# Patient Record
Sex: Female | Born: 1960 | Race: White | Hispanic: No | Marital: Married | Smoking: Never smoker
Health system: Southern US, Community
[De-identification: ages and names within clinical notes are randomized; demographics above are authoritative.]

## PROBLEM LIST (undated history)

## (undated) DIAGNOSIS — D649 Anemia, unspecified: Secondary | ICD-10-CM

## (undated) DIAGNOSIS — R42 Dizziness and giddiness: Secondary | ICD-10-CM

## (undated) DIAGNOSIS — M8580 Other specified disorders of bone density and structure, unspecified site: Secondary | ICD-10-CM

## (undated) DIAGNOSIS — K219 Gastro-esophageal reflux disease without esophagitis: Secondary | ICD-10-CM

## (undated) DIAGNOSIS — T8859XA Other complications of anesthesia, initial encounter: Secondary | ICD-10-CM

## (undated) DIAGNOSIS — T7840XA Allergy, unspecified, initial encounter: Secondary | ICD-10-CM

## (undated) DIAGNOSIS — I10 Essential (primary) hypertension: Secondary | ICD-10-CM

## (undated) DIAGNOSIS — T4145XA Adverse effect of unspecified anesthetic, initial encounter: Secondary | ICD-10-CM

## (undated) DIAGNOSIS — E7800 Pure hypercholesterolemia, unspecified: Secondary | ICD-10-CM

## (undated) DIAGNOSIS — G43909 Migraine, unspecified, not intractable, without status migrainosus: Secondary | ICD-10-CM

## (undated) DIAGNOSIS — M1990 Unspecified osteoarthritis, unspecified site: Secondary | ICD-10-CM

## (undated) DIAGNOSIS — N6019 Diffuse cystic mastopathy of unspecified breast: Secondary | ICD-10-CM

## (undated) DIAGNOSIS — J45909 Unspecified asthma, uncomplicated: Secondary | ICD-10-CM

## (undated) HISTORY — DX: Diffuse cystic mastopathy of unspecified breast: N60.19

## (undated) HISTORY — DX: Essential (primary) hypertension: I10

## (undated) HISTORY — PX: COLONOSCOPY: SHX174

## (undated) HISTORY — DX: Allergy, unspecified, initial encounter: T78.40XA

## (undated) HISTORY — DX: Unspecified osteoarthritis, unspecified site: M19.90

## (undated) HISTORY — DX: Unspecified asthma, uncomplicated: J45.909

## (undated) HISTORY — DX: Migraine, unspecified, not intractable, without status migrainosus: G43.909

## (undated) HISTORY — DX: Other specified disorders of bone density and structure, unspecified site: M85.80

## (undated) HISTORY — PX: WISDOM TOOTH EXTRACTION: SHX21

## (undated) HISTORY — DX: Dizziness and giddiness: R42

## (undated) HISTORY — DX: Gastro-esophageal reflux disease without esophagitis: K21.9

## (undated) HISTORY — DX: Anemia, unspecified: D64.9

---

## 2007-03-13 ENCOUNTER — Ambulatory Visit: Payer: Self-pay | Admitting: Gastroenterology

## 2007-03-22 ENCOUNTER — Ambulatory Visit: Payer: Self-pay | Admitting: Family Medicine

## 2007-05-01 ENCOUNTER — Ambulatory Visit: Payer: Self-pay | Admitting: Family Medicine

## 2008-01-08 ENCOUNTER — Ambulatory Visit: Payer: Self-pay | Admitting: Hematology & Oncology

## 2008-03-18 ENCOUNTER — Ambulatory Visit: Payer: Self-pay | Admitting: Gastroenterology

## 2009-01-07 ENCOUNTER — Ambulatory Visit: Payer: Self-pay | Admitting: Family Medicine

## 2009-09-28 ENCOUNTER — Ambulatory Visit: Payer: Self-pay

## 2010-05-11 ENCOUNTER — Ambulatory Visit: Payer: Self-pay | Admitting: Family Medicine

## 2011-05-19 LAB — HM PAP SMEAR: HM Pap smear: NEGATIVE

## 2011-05-26 LAB — HM DEXA SCAN

## 2011-11-21 ENCOUNTER — Emergency Department: Payer: Self-pay | Admitting: Emergency Medicine

## 2012-11-05 ENCOUNTER — Ambulatory Visit: Payer: Self-pay | Admitting: Gastroenterology

## 2012-11-05 LAB — HM COLONOSCOPY

## 2013-01-14 ENCOUNTER — Ambulatory Visit: Payer: Self-pay | Admitting: Family Medicine

## 2013-01-14 LAB — HM MAMMOGRAPHY

## 2014-06-11 LAB — LIPID PANEL
Cholesterol: 248 mg/dL — AB (ref 0–200)
HDL: 54 mg/dL (ref 35–70)
LDL Cholesterol: 175 mg/dL
TRIGLYCERIDES: 93 mg/dL (ref 40–160)

## 2014-06-11 LAB — BASIC METABOLIC PANEL
CREATININE: 0.8 mg/dL (ref <=1.1)
Glucose: 101 mg/dL

## 2014-06-11 LAB — CBC AND DIFFERENTIAL
HCT: 44 % (ref 36–46)
Hemoglobin: 14.7 g/dL (ref 12.0–16.0)
PLATELETS: 208 10*3/uL (ref 150–399)

## 2014-06-11 LAB — TSH: TSH: 1.88 u[IU]/mL (ref <=5.90)

## 2014-07-01 ENCOUNTER — Ambulatory Visit: Payer: Self-pay | Admitting: Family Medicine

## 2014-11-13 ENCOUNTER — Telehealth: Payer: Self-pay | Admitting: Family Medicine

## 2014-11-13 NOTE — Telephone Encounter (Signed)
Returned call, patient wanted to know when she was due for follow-up ov. Advised pt that she is due for 5-6 month f/u now.

## 2014-11-13 NOTE — Telephone Encounter (Signed)
Pt calling with a question about vitamin D. CB# [phone] CC

## 2014-11-16 ENCOUNTER — Ambulatory Visit (INDEPENDENT_AMBULATORY_CARE_PROVIDER_SITE_OTHER): Payer: 59 | Admitting: Family Medicine

## 2014-11-16 ENCOUNTER — Encounter: Payer: Self-pay | Admitting: Family Medicine

## 2014-11-16 VITALS — BP 120/78 | HR 64 | Temp 98.7°F | Resp 16 | Ht 62.0 in | Wt 119.0 lb

## 2014-11-16 DIAGNOSIS — R5383 Other fatigue: Secondary | ICD-10-CM | POA: Insufficient documentation

## 2014-11-16 DIAGNOSIS — N6019 Diffuse cystic mastopathy of unspecified breast: Secondary | ICD-10-CM | POA: Insufficient documentation

## 2014-11-16 DIAGNOSIS — S2239XA Fracture of one rib, unspecified side, initial encounter for closed fracture: Secondary | ICD-10-CM | POA: Insufficient documentation

## 2014-11-16 DIAGNOSIS — N390 Urinary tract infection, site not specified: Secondary | ICD-10-CM | POA: Insufficient documentation

## 2014-11-16 DIAGNOSIS — Z862 Personal history of diseases of the blood and blood-forming organs and certain disorders involving the immune mechanism: Secondary | ICD-10-CM | POA: Insufficient documentation

## 2014-11-16 DIAGNOSIS — D509 Iron deficiency anemia, unspecified: Secondary | ICD-10-CM | POA: Diagnosis not present

## 2014-11-16 DIAGNOSIS — R42 Dizziness and giddiness: Secondary | ICD-10-CM | POA: Diagnosis not present

## 2014-11-16 DIAGNOSIS — E559 Vitamin D deficiency, unspecified: Secondary | ICD-10-CM | POA: Diagnosis not present

## 2014-11-16 DIAGNOSIS — R319 Hematuria, unspecified: Secondary | ICD-10-CM | POA: Insufficient documentation

## 2014-11-16 DIAGNOSIS — J45909 Unspecified asthma, uncomplicated: Secondary | ICD-10-CM | POA: Insufficient documentation

## 2014-11-16 DIAGNOSIS — M8580 Other specified disorders of bone density and structure, unspecified site: Secondary | ICD-10-CM | POA: Insufficient documentation

## 2014-11-16 NOTE — Progress Notes (Signed)
       Patient: Anne Swanson Female    DOB: 26-Jul-1960   54 y.o.   MRN: [id number] Visit Date: 11/16/2014  Today's Provider: Mila Merry, MD   Chief Complaint  Patient presents with  . Dizziness   Subjective:    Dizziness This is a new problem. The current episode started in the past 7 days. The problem occurs constantly. The problem has been unchanged. Associated symptoms include a change in bowel habit, fatigue, headaches and neck pain. Pertinent negatives include no abdominal pain, chest pain, congestion, coughing, nausea, numbness, vertigo or vomiting. The symptoms are aggravated by bending, standing and twisting. She has tried rest for the symptoms. The treatment provided mild relief.   She states she had similar episode a few years ago when she was found to be iron deficient and was treated with iron supplementaion.     Allergies  Allergen Reactions  . Lodine  [Etodolac]    Previous Medications   ALBUTEROL (VENTOLIN HFA) 108 (90 BASE) MCG/ACT INHALER    Inhale into the lungs.    Review of Systems  Constitutional: Positive for fatigue.  HENT: Negative for congestion.   Respiratory: Negative for cough.   Cardiovascular: Negative for chest pain and palpitations.  Gastrointestinal: Positive for change in bowel habit. Negative for nausea, vomiting and abdominal pain.  Musculoskeletal: Positive for neck pain.  Neurological: Positive for dizziness and headaches. Negative for vertigo and numbness.    History  Substance Use Topics  . Smoking status: Never Smoker   . Smokeless tobacco: Not on file  . Alcohol Use: 0.0 oz/week    0 Standard drinks or equivalent per week   Objective:   BP 120/78 mmHg  Pulse 64  Temp(Src) 98.7 F (37.1 C) (Oral)  Resp 16  Ht  (1.575 m)  Wt 119 lb (53.978 kg)  BMI 21.76 kg/m2  SpO2 97%  LMP 02/15/2014  Physical Exam  General Appearance:    Alert, cooperative, no distress  HENT:   ENT exam normal, no neck nodes or sinus  tenderness. Dix Halpike negative.   Eyes:    PERRL, conjunctiva/corneas clear, EOM's intact       Lungs:     Clear to auscultation bilaterally, respirations unlabored  Heart:    Regular rate and rhythm  Neurologic:   Awake, alert, oriented x 3. No apparent focal neurological           defect.             Assessment & Plan:     1. Dizziness Not consistent with vertigo.  - Comprehensive metabolic panel - T4 AND TSH  2. Avitaminosis D  - Vit D  25 hydroxy (rtn osteoporosis monitoring)  3. Anemia, iron deficiency  - CBC - Ferritin        Mila Merry, MD  [facility]

## 2014-11-17 LAB — COMPREHENSIVE METABOLIC PANEL
ALBUMIN: 4.5 g/dL (ref 3.5–5.5)
ALT: 12 IU/L (ref 0–32)
AST: 18 IU/L (ref 0–40)
Albumin/Globulin Ratio: 1.7 (ref 1.1–2.5)
Alkaline Phosphatase: 98 IU/L (ref 39–117)
BILIRUBIN TOTAL: 0.3 mg/dL (ref 0.0–1.2)
BUN / CREAT RATIO: 17 (ref 9–23)
BUN: 10 mg/dL (ref 6–24)
CALCIUM: 9.3 mg/dL (ref 8.7–10.2)
CHLORIDE: 102 mmol/L (ref 97–108)
CO2: 25 mmol/L (ref 18–29)
CREATININE: 0.6 mg/dL (ref 0.57–1.00)
GFR calc Af Amer: 120 mL/min/{1.73_m2} (ref >59)
GFR, EST NON AFRICAN AMERICAN: 104 mL/min/{1.73_m2} (ref >59)
GLOBULIN, TOTAL: 2.6 g/dL (ref 1.5–4.5)
Glucose: 99 mg/dL (ref 65–99)
Potassium: 4.5 mmol/L (ref 3.5–5.2)
SODIUM: 144 mmol/L (ref 134–144)
Total Protein: 7.1 g/dL (ref 6.0–8.5)

## 2014-11-17 LAB — CBC
Hematocrit: 41.7 % (ref 34.0–46.6)
Hemoglobin: 14.2 g/dL (ref 11.1–15.9)
MCH: 30.1 pg (ref 26.6–33.0)
MCHC: 34.1 g/dL (ref 31.5–35.7)
MCV: 89 fL (ref 79–97)
PLATELETS: 199 10*3/uL (ref 150–379)
RBC: 4.71 x10E6/uL (ref 3.77–5.28)
RDW: 13.7 % (ref 12.3–15.4)
WBC: 5.3 10*3/uL (ref 3.4–10.8)

## 2014-11-17 LAB — FERRITIN: Ferritin: 57 ng/mL (ref 15–150)

## 2014-11-17 LAB — VITAMIN D 25 HYDROXY (VIT D DEFICIENCY, FRACTURES): Vit D, 25-Hydroxy: 28.8 ng/mL — ABNORMAL LOW (ref 30.0–100.0)

## 2014-11-17 LAB — T4 AND TSH
T4 TOTAL: 7.6 ug/dL (ref 4.5–12.0)
TSH: 0.872 u[IU]/mL (ref 0.450–4.500)

## 2014-12-02 ENCOUNTER — Ambulatory Visit (INDEPENDENT_AMBULATORY_CARE_PROVIDER_SITE_OTHER): Payer: 59 | Admitting: Family Medicine

## 2014-12-02 ENCOUNTER — Telehealth: Payer: Self-pay | Admitting: Family Medicine

## 2014-12-02 ENCOUNTER — Encounter: Payer: Self-pay | Admitting: Family Medicine

## 2014-12-02 VITALS — BP 122/78 | HR 85 | Temp 98.6°F | Resp 16 | Ht 62.0 in | Wt 118.0 lb

## 2014-12-02 DIAGNOSIS — R42 Dizziness and giddiness: Secondary | ICD-10-CM | POA: Diagnosis not present

## 2014-12-02 DIAGNOSIS — R319 Hematuria, unspecified: Secondary | ICD-10-CM | POA: Insufficient documentation

## 2014-12-02 DIAGNOSIS — R51 Headache: Secondary | ICD-10-CM | POA: Diagnosis not present

## 2014-12-02 DIAGNOSIS — R519 Headache, unspecified: Secondary | ICD-10-CM

## 2014-12-02 NOTE — Telephone Encounter (Signed)
Per Dr. Jamse Belfast okay to see pt today at 1:00 pm. Patient notified.

## 2014-12-02 NOTE — Telephone Encounter (Signed)
Pt would like to come in to see you today.  She is having dizzy spells and today it has gotten worse.    Please advise if you can see her.  [phone]  Thanks Barth Kirks

## 2014-12-02 NOTE — Telephone Encounter (Signed)
Pease advise.
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
## 2014-12-03 ENCOUNTER — Telehealth: Payer: Self-pay | Admitting: Family Medicine

## 2014-12-03 DIAGNOSIS — R42 Dizziness and giddiness: Secondary | ICD-10-CM

## 2014-12-03 NOTE — Telephone Encounter (Signed)
Please add referral to Epic for MRI of brian without.This is what insurance has approved without peer to peer

## 2014-12-07 ENCOUNTER — Ambulatory Visit
Admission: RE | Admit: 2014-12-07 | Discharge: 2014-12-07 | Disposition: A | Discharge status: Home / Self Care | Payer: 59 | Source: Ambulatory Visit | Attending: Family Medicine | Admitting: Family Medicine

## 2014-12-07 DIAGNOSIS — R51 Headache: Secondary | ICD-10-CM | POA: Insufficient documentation

## 2014-12-07 DIAGNOSIS — R9082 White matter disease, unspecified: Secondary | ICD-10-CM | POA: Insufficient documentation

## 2014-12-07 DIAGNOSIS — R42 Dizziness and giddiness: Secondary | ICD-10-CM | POA: Diagnosis not present

## 2014-12-09 ENCOUNTER — Telehealth: Payer: Self-pay | Admitting: Family Medicine

## 2014-12-09 ENCOUNTER — Other Ambulatory Visit: Payer: Self-pay | Admitting: Family Medicine

## 2014-12-09 DIAGNOSIS — R519 Headache, unspecified: Secondary | ICD-10-CM

## 2014-12-09 DIAGNOSIS — R42 Dizziness and giddiness: Secondary | ICD-10-CM

## 2014-12-09 DIAGNOSIS — R51 Headache: Secondary | ICD-10-CM

## 2014-12-09 DIAGNOSIS — R9089 Other abnormal findings on diagnostic imaging of central nervous system: Secondary | ICD-10-CM

## 2014-12-09 MED ORDER — LOVASTATIN 20 MG PO TABS: 20.0000 mg | ORAL_TABLET | Freq: Every day | ORAL | Status: DC

## 2014-12-09 NOTE — Telephone Encounter (Signed)
Pt is requesting results for a MRI.  Pt also had questions about a Rx called in to [facility].  WU#[phone]/[id number]

## 2014-12-09 NOTE — Progress Notes (Unsigned)
Please refer neurology for dizziness and ischemic changes on MRI

## 2014-12-10 ENCOUNTER — Other Ambulatory Visit: Payer: Self-pay

## 2014-12-10 ENCOUNTER — Emergency Department
Admission: EM | Admit: 2014-12-10 | Discharge: 2014-12-10 | Disposition: A | Discharge status: Home / Self Care | Payer: 59 | Attending: Student | Admitting: Student

## 2014-12-10 ENCOUNTER — Encounter: Payer: Self-pay | Admitting: Emergency Medicine

## 2014-12-10 DIAGNOSIS — Z79899 Other long term (current) drug therapy: Secondary | ICD-10-CM | POA: Diagnosis not present

## 2014-12-10 DIAGNOSIS — G4489 Other headache syndrome: Secondary | ICD-10-CM | POA: Insufficient documentation

## 2014-12-10 DIAGNOSIS — R42 Dizziness and giddiness: Secondary | ICD-10-CM | POA: Insufficient documentation

## 2014-12-10 LAB — BASIC METABOLIC PANEL
ANION GAP: 8 (ref 5–15)
BUN: 11 mg/dL (ref 6–20)
CALCIUM: 9.3 mg/dL (ref 8.9–10.3)
CO2: 27 mmol/L (ref 22–32)
Chloride: 104 mmol/L (ref 101–111)
Creatinine, Ser: 0.7 mg/dL (ref 0.44–1.00)
GFR calc Af Amer: >60 mL/min (ref >60)
GLUCOSE: 107 mg/dL — AB (ref 65–99)
POTASSIUM: 3.6 mmol/L (ref 3.5–5.1)
SODIUM: 139 mmol/L (ref 135–145)

## 2014-12-10 LAB — URINALYSIS COMPLETE WITH MICROSCOPIC (ARMC ONLY)
BILIRUBIN URINE: NEGATIVE
Bacteria, UA: NONE SEEN
GLUCOSE, UA: NEGATIVE mg/dL
HGB URINE DIPSTICK: NEGATIVE
Ketones, ur: NEGATIVE mg/dL
NITRITE: NEGATIVE
PH: 7 (ref 5.0–8.0)
Protein, ur: NEGATIVE mg/dL
SPECIFIC GRAVITY, URINE: 1.002 — AB (ref 1.005–1.030)

## 2014-12-10 LAB — CBC
HEMATOCRIT: 43.2 % (ref 35.0–47.0)
HEMOGLOBIN: 14.3 g/dL (ref 12.0–16.0)
MCH: 29.1 pg (ref 26.0–34.0)
MCHC: 33.1 g/dL (ref 32.0–36.0)
MCV: 88.1 fL (ref 80.0–100.0)
Platelets: 201 10*3/uL (ref 150–440)
RBC: 4.9 MIL/uL (ref 3.80–5.20)
RDW: 13.6 % (ref 11.5–14.5)
WBC: 6.8 10*3/uL (ref 3.6–11.0)

## 2014-12-10 MED ORDER — SODIUM CHLORIDE 0.9 % IV SOLN
200.0000 mg | Freq: Two times a day (BID) | INTRAVENOUS | Status: AC
  Administered 2014-12-10: 200 mg via INTRAVENOUS
  Filled 2014-12-10: qty 20

## 2014-12-10 MED ORDER — LACOSAMIDE 100 MG PO TABS: 100.0000 mg | ORAL_TABLET | Freq: Two times a day (BID) | ORAL | Status: DC

## 2014-12-10 NOTE — ED Provider Notes (Signed)
[facility] Emergency Department Provider Note  ____________________________________________  Time seen: Approximately 5:37 PM  I have reviewed the triage vital signs and the nursing notes.   HISTORY  Chief Complaint Dizziness    HPI Anne Swanson is a 54 y.o. female with history of anemia who presents for evaluation of one month intermittent dizziness, gradual onset. Patient reports that over the past month she has had dizziness which consists of "feeling drunk" as well as room spinning. She reports typically her symptoms last for several hours, or associate with mild headache, sometimes associated with numbness or tingling in her arms or face. Typically her symptoms resolve within a few hours. She was seen by her primary care doctor, Dr. Sherrie Mustache, on 11/16/2014 for similar symptoms. He referred her for a noncontrasted MRI of her brain which was obtained 3 days ago and showed "No acute abnormality. Small hyperintensities in the pons and frontal white matter bilaterally likely related to chronic microvascular ischemia. Demyelinating disease considered unlikely". No space-occupying lesion was noted. Last night she developed the same symptoms of dizziness, intermittent  Facial and arm numbness however they felt more severe and they have persisted today on and off. She reports usually she will have symptoms for a few hours one day and they will go away spontaneously but return the next day. This time she has had symptoms 2 days in a row. She spoke with her primary care doctor and given her ongoing symptoms, she was referred to the emergency department for evaluation. She reports that change in position/movement does worsen her symptoms. She has had no fevers, no neck stiffness, no nausea, vomiting, diarrhea, fevers or chills. No chest pain or difficulty breathing. Currently her symptoms are mild to moderate. Of note, she reports that she had similar symptoms 5 years ago but they  resolved spontaneously.   Past Medical History  Diagnosis Date  . Fibrocystic breast disease   . Osteopenia   . Asthma     Patient Active Problem List   Diagnosis Date Noted  . Hematuria 12/02/2014  . 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 Fatigue 11/16/2014  . Fibrocystic breast disease 11/16/2014  . Anemia, iron deficiency 11/16/2014  . Osteopenia 11/16/2014  . Recurrent UTI 11/16/2014  . Closed rib fracture 11/16/2014  . Avitaminosis D 11/16/2014  . Dizziness 11/16/2014    Past Surgical History  Procedure Laterality Date  . Wisdom tooth extraction    . Cesarean section      x3    Current Outpatient Rx  Name  Route  Sig  Dispense  Refill  . albuterol (VENTOLIN HFA) 108 (90 BASE) MCG/ACT inhaler   Inhalation   Inhale into the lungs.         . Lacosamide 100 MG TABS   Oral   Take 1 tablet (100 mg total) by mouth 2 (two) times daily.   40 tablet   0   . lovastatin (MEVACOR) 20 MG tablet   Oral   Take 1 tablet (20 mg total) by mouth at bedtime.   30 tablet   1     Allergies Lodine  Family History  Problem Relation Age of Onset  . Coronary artery disease Father   . Cancer Maternal Uncle     stomach    Social History Social History  Substance Use Topics  . Smoking status: Never Smoker   . Smokeless tobacco: None  . Alcohol Use: 0.0 oz/week    0 Standard drinks or equivalent per week  Comment: once a month    Review of Systems Constitutional: No fever/chills Eyes: No visual changes. ENT: No sore throat. Cardiovascular: Denies chest pain. Respiratory: Denies shortness of breath. Gastrointestinal: No abdominal pain.  No nausea, no vomiting.  No diarrhea.  No constipation. Genitourinary: Negative for dysuria. Musculoskeletal: Negative for back pain. Skin: Negative for rash. Neurological: Positive for headaches, negatvie forfocal weakness, + intermittent numbness.  10-point ROS otherwise  negative.  ____________________________________________   PHYSICAL EXAM:  VITAL SIGNS: ED Triage Vitals  Enc Vitals Group     BP 12/10/14 1614 154/70 mmHg     Pulse Rate 12/10/14 1614 75     Resp 12/10/14 1614 20     Temp 12/10/14 1614 98.2 F (36.8 C)     Temp Source 12/10/14 1614 Oral     SpO2 12/10/14 1614 99 %     Weight 12/10/14 1614 114 lb (51.71 kg)     Height 12/10/14 1614 5\' 2"  (1.575 m)     Head Cir --      Peak Flow --      Pain Score 12/10/14 1614 5     Pain Loc --      Pain Edu? --      Excl. in GC? --     Constitutional: Alert and oriented. Well appearing and in no acute distress. Sitting up in bed, pleasant, talkative, smiling. Eyes: Conjunctivae are normal. PERRL. EOMI. Head: Atraumatic. Nose: No congestion/rhinnorhea. Mouth/Throat: Mucous membranes are moist.  Oropharynx non-erythematous. Neck: No stridor.  Supple without meningismus. Cardiovascular: Normal rate, regular rhythm. Grossly normal heart sounds.  Good peripheral circulation. Respiratory: Normal respiratory effort.  No retractions. Lungs CTAB. Gastrointestinal: Soft and nontender. No distention. No abdominal bruits. No CVA tenderness. Genitourinary: Deferred Musculoskeletal: No lower extremity tenderness nor edema.  No joint effusions. Neurologic:  Normal speech and language.  No gait instability. 5 out of 5 strength in bilateral upper and lower extremities, no pronator drift, cranial nerves II through XII intact in terms of motor function however she does have decreased sensation to light touch in the distribution of the second and third branch of the fifth cranial nerve on the left face. Intact sensation to light touch in bilateral upper and lower extremities. Normal finger-nose-finger. Skin:  Skin is warm, dry and intact. No rash noted. Psychiatric: Mood and affect are normal. Speech and behavior are normal.  ____________________________________________   LABS (all labs ordered are listed,  but only abnormal results are displayed)  Labs Reviewed  BASIC METABOLIC PANEL - Abnormal; Notable for the following:    Glucose, Bld 107 (*)    All other components within normal limits  URINALYSIS COMPLETEWITH MICROSCOPIC ([facility] ONLY) - Abnormal; Notable for the following:    Color, Urine COLORLESS (*)    APPearance CLEAR (*)    Specific Gravity, Urine 1.002 (*)    Leukocytes, UA 3+ (*)    Squamous Epithelial / LPF 0-5 (*)    All other components within normal limits  URINE CULTURE  CBC   ____________________________________________  EKG  ED ECG REPORT I, Gayla Doss, the attending physician, personally viewed and interpreted this ECG.   Date: 12/10/2014  EKG Time: 16:17  Rate: 68  Rhythm: normal sinus rhythm  Axis: normal  Intervals:none  ST&T Change: No acute ST segment elevation. Low voltage QRS is nonspecific.  ____________________________________________  RADIOLOGY  none ____________________________________________   PROCEDURES  Procedure(s) performed: None  Critical Care performed: No  ____________________________________________   INITIAL IMPRESSION / ASSESSMENT  AND PLAN / ED COURSE  Pertinent labs & imaging results that were available during my care of the patient were reviewed by me and considered in my medical decision making (see chart for details).  Ahley A Heiny is a 54 y.o. female with history of anemia who presents for evaluation of one month intermittent dizziness, gradual onset. On exam, she is very well-appearing and in no acute distress. Vital signs stable, she is afebrile. Neck supple without meningismus, doubt meningitis. Clinical picture is not consistent with subarachnoid hemorrhage. Symptoms have been ongoing for one month at this point she had an MRI 3 days ago showing no acute abnormality making acute CVA unlikely. No evidence of space-occupying lesion on MRI. Additionally, her exam is benign with the exception of some mild  subjective sensory deficit in the left lower two thirds of the face. I discussed the case with Dr. 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 He recommends load with Vimpat and outpatient trial of by mouth Vimpat. He does not believe the patient requires admission for this, additionally he thinks that contrasted MRI brain/MRA would be of little utility in the emergent setting and that she could follow-up as an outpatient for additional testing and treatment. I discussed this with the patient. She is willing to try the Vimpat. She has follow-up with Dr. Clelia Croft scheduled for 1 month from now. We discussed meticulous return precautions and she and husband at bedside are comfortable with discharge home. Urinalysis with 3+ leuk esterase however no white blood cells, no bacteria, she does not have urinary complaints, we'll send culture but will not treat for UTI. ____________________________________________   FINAL CLINICAL IMPRESSION(S) / ED DIAGNOSES  Final diagnoses:  Dizziness  Other headache syndrome      Gayla Doss, MD 12/10/14 1945

## 2014-12-10 NOTE — Telephone Encounter (Signed)
Returned patient's call. Patient stated that at 8:30 pm on 12/09/2014 she had another episode of dizziness, nausea, and numbness on left side of face and arm. Patient also stated that she has been having small headaches intermittently. Patient stated that this symptoms still continue today. Patient stated dizziness and nausea is so bad, she cannot stand to move around. Patient does not see  Dr. Sherryll Burger of Neurology until 01/12/15. Patient wants to know what should she do? Per Dr. Elease Hashimoto, feels pt should go to ER since symptoms have continued for this many hours without ending. Patient was advised to go to ER. Patient stated that she would go now.

## 2014-12-10 NOTE — ED Notes (Signed)
Patient presents to the ED with dizziness, left sided facial and arm numbness.  Patient reports history of similar symptoms and is being followed by neurology but was instructed by MD to come in today due to the length of this episode.  Patient also reports headache.

## 2014-12-10 NOTE — ED Notes (Signed)
Stopped Lacosamide at this time.

## 2014-12-10 NOTE — ED Notes (Signed)
Called pharmacy to send pt medication. 

## 2014-12-11 ENCOUNTER — Telehealth: Payer: Self-pay | Admitting: Family Medicine

## 2014-12-11 NOTE — Telephone Encounter (Signed)
Pt was discharged from [facility] ER 12/09/2014 for numbness in her arm and face and dizziness.  I have scheduled a follow up appt/MW

## 2014-12-11 NOTE — Telephone Encounter (Signed)
Please review. Thanks!  

## 2014-12-12 LAB — URINE CULTURE

## 2014-12-15 ENCOUNTER — Encounter: Payer: Self-pay | Admitting: Family Medicine

## 2014-12-15 ENCOUNTER — Ambulatory Visit (INDEPENDENT_AMBULATORY_CARE_PROVIDER_SITE_OTHER): Payer: 59 | Admitting: Family Medicine

## 2014-12-15 VITALS — BP 98/68 | HR 85 | Temp 98.5°F | Resp 16 | Ht 62.0 in | Wt 117.0 lb

## 2014-12-15 DIAGNOSIS — R51 Headache: Secondary | ICD-10-CM

## 2014-12-15 DIAGNOSIS — R42 Dizziness and giddiness: Secondary | ICD-10-CM | POA: Diagnosis not present

## 2014-12-15 DIAGNOSIS — G458 Other transient cerebral ischemic attacks and related syndromes: Secondary | ICD-10-CM | POA: Diagnosis not present

## 2014-12-15 DIAGNOSIS — R519 Headache, unspecified: Secondary | ICD-10-CM

## 2014-12-15 NOTE — Progress Notes (Signed)
Patient: Anne Swanson Female    DOB: 02-06-1961   54 y.o.   MRN: [id number] Visit Date: 12/15/2014  Today's Provider: Lelon Huh, MD   Chief Complaint  Patient presents with  . Hospitalization Follow-up  . Dizziness   Subjective:    HPI Patient was initially seen 11/16/14 with dizziness and had CBC, Met c and Ferritin levels. Symptoms persistent and she returned 8/17 at which time we ordered MRI of brain showing microvascular ischemic changes in pons and bilateral frontal lobes. At that time we setup neurology referral and sent in prescription lovastatin. Before starting lovastatin she developed numbness on the left side of her face and left arm on 12/11/14, at which time she advised to go to ER. Per ER notes it was recommended by Dr. Valora Corporal (neurology) to try Vimpat to cover for partial seizures. She was given loading dose in ER and rx for 100 Q12. She states dizziness greatly improved within a few hours, and numbness resolved the next day. She has had no numbness since then. However she continues feel a little dizzy all the time, and states she gets very dizzy with worsening headache 1-2 hours before she is due for next dose of Vimpat. Dizziness seems to significantly improve within an hour or two of taking medication.   Of interest is that she states she had a cousin with similar symptoms who was eventually diagnosed with Chiari malformation.     Allergies  Allergen Reactions  . Lodine [Etodolac] Other (See Comments)    Facial numbness   Previous Medications   ALBUTEROL (VENTOLIN HFA) 108 (90 BASE) MCG/ACT INHALER    Inhale into the lungs.   LACOSAMIDE 100 MG TABS    Take 1 tablet (100 mg total) by mouth 2 (two) times daily.   LOVASTATIN (MEVACOR) 20 MG TABLET    Take 1 tablet (20 mg total) by mouth at bedtime.   Lab Results  Component Value Date   CHOL 248* 06/11/2014   HDL 54 06/11/2014   LDLCALC 175 06/11/2014   TRIG 93 06/11/2014    Review of Systems    Constitutional: Negative for fever, chills and fatigue.  HENT: Negative for congestion.   Respiratory: Negative for cough, chest tightness and shortness of breath.   Cardiovascular: Negative for chest pain and palpitations.  Gastrointestinal: Positive for nausea.       Occasional   Neurological: Positive for dizziness, numbness and headaches.       Numbness in left arm and left side of face     family history includes Cancer in her maternal uncle; Coronary artery disease in her father. Family Status  Relation Status Death Age  . Mother Deceased 72    Breast cancer and colon cancer  . Father Deceased 38    died from MI  . Brother Deceased 79    fdied from Leukemia   Social History  Substance Use Topics  . Smoking status: Never Smoker   . Smokeless tobacco: Not on file  . Alcohol Use: 0.0 oz/week    0 Standard drinks or equivalent per week     Comment: once a month   Objective:   BP 98/68 mmHg  Pulse 85  Temp(Src) 98.5 F (36.9 C) (Oral)  Resp 16  Ht '5\' 2"'  (1.575 m)  Wt 117 lb (53.071 kg)  BMI 21.39 kg/m2  SpO2 98%  LMP 02/15/2014  Physical Exam  General Appearance:    Alert, cooperative, no distress  Eyes:    PERRL, conjunctiva/corneas clear, EOM's intact       Lungs:     Clear to auscultation bilaterally, respirations unlabored  Heart:    Regular rate and rhythm  Neurologic:   Awake, alert, oriented x 3. No apparent focal neurological           defect. MS+5/5 bilateral UE. DTRs +1 bilateral UEs           Assessment & Plan:     1. Other specified transient cerebral ischemias Considering early age onset of vascular disease in her family and ischemic changes in MRI, she was advised to start lovastatin while awaiting neurology evaluation.  - US Carotid Duplex Bilateral; Future  2. Dizziness Overall is progressive, but does seem to improve for several hours after each dose Vimpat, which she will continue for now.   3. Headache, unspecified headache type  Of  interest is family history Chiari malformation in her cousin, although this was not seen on recent on recent MRI. Suggest that she mention this to neurologist.            Lelon Huh, MD  [facility]

## 2014-12-18 ENCOUNTER — Ambulatory Visit
Admission: RE | Admit: 2014-12-18 | Discharge: 2014-12-18 | Disposition: A | Discharge status: Home / Self Care | Payer: 59 | Source: Ambulatory Visit | Attending: Family Medicine | Admitting: Family Medicine

## 2014-12-18 ENCOUNTER — Encounter: Payer: Self-pay | Admitting: Family Medicine

## 2014-12-18 DIAGNOSIS — G458 Other transient cerebral ischemic attacks and related syndromes: Secondary | ICD-10-CM | POA: Diagnosis present

## 2014-12-18 DIAGNOSIS — I6523 Occlusion and stenosis of bilateral carotid arteries: Secondary | ICD-10-CM | POA: Insufficient documentation

## 2014-12-28 NOTE — Telephone Encounter (Signed)
She can have the 9:45 or the 3:45. Tomorrow. Thanks.

## 2014-12-28 NOTE — Telephone Encounter (Signed)
Do you have any openings tomorrow for patient? Please advise. Thanks!

## 2014-12-28 NOTE — Telephone Encounter (Signed)
Pt called back to schedule appt tomorrow/9:45/MW

## 2014-12-28 NOTE — Telephone Encounter (Signed)
Left message for pt to call back/MW °

## 2014-12-28 NOTE — Telephone Encounter (Signed)
Pt would like to see if Dr. Sherrie Mustache can get her worked in tomorrow because she thinks she might have a bladder infection and she needs a medication f/u. Symptoms: Burning and discomfort when she voids. Pt stated it started last night 12/27/14. Please advise. Thanks TNP

## 2014-12-28 NOTE — Telephone Encounter (Signed)
Pt stated she only wants to see Dr. Sherrie Mustache. Please see note below. Thanks TNP

## 2014-12-29 ENCOUNTER — Encounter: Payer: Self-pay | Admitting: Family Medicine

## 2014-12-29 ENCOUNTER — Telehealth: Payer: Self-pay | Admitting: Family Medicine

## 2014-12-29 ENCOUNTER — Other Ambulatory Visit: Payer: Self-pay | Admitting: Family Medicine

## 2014-12-29 ENCOUNTER — Ambulatory Visit (INDEPENDENT_AMBULATORY_CARE_PROVIDER_SITE_OTHER): Payer: 59 | Admitting: Family Medicine

## 2014-12-29 VITALS — BP 106/72 | HR 70 | Temp 98.1°F | Resp 16 | Ht 62.0 in | Wt 119.0 lb

## 2014-12-29 DIAGNOSIS — R300 Dysuria: Secondary | ICD-10-CM

## 2014-12-29 DIAGNOSIS — R42 Dizziness and giddiness: Secondary | ICD-10-CM

## 2014-12-29 DIAGNOSIS — N390 Urinary tract infection, site not specified: Secondary | ICD-10-CM

## 2014-12-29 LAB — POCT URINALYSIS DIPSTICK
BILIRUBIN UA: NEGATIVE
Glucose, UA: NEGATIVE
KETONES UA: NEGATIVE
Nitrite, UA: NEGATIVE
PH UA: 6
PROTEIN UA: NEGATIVE
SPEC GRAV UA: 1.01
Urobilinogen, UA: 0.2

## 2014-12-29 MED ORDER — CIPROFLOXACIN HCL 500 MG PO TABS: 500.0000 mg | ORAL_TABLET | Freq: Two times a day (BID) | ORAL | Status: AC

## 2014-12-29 MED ORDER — LACOSAMIDE 100 MG PO TABS: 100.0000 mg | ORAL_TABLET | Freq: Two times a day (BID) | ORAL | Status: DC

## 2014-12-29 NOTE — Progress Notes (Signed)
Patient: Anne Swanson Female    DOB: 12-Sep-1960   54 y.o.   MRN: [id number] Visit Date: 12/29/2014  Today's Provider: Mila Merry, MD   Chief Complaint  Patient presents with  . Dysuria    x 2 days   Subjective:    Dysuria  This is a new problem. Episode onset: 2 days. The problem occurs every urination. The problem has been unchanged. The quality of the pain is described as burning. There has been no fever. Associated symptoms include frequency, hesitancy, nausea, sweats and urgency. Pertinent negatives include no chills, discharge, flank pain, hematuria, possible pregnancy or vomiting. Associated symptoms comments: Abdominal pain radiating up her torso. She has tried nothing for the symptoms.       Allergies  Allergen Reactions  . Lodine [Etodolac] Other (See Comments)    Facial numbness   Previous Medications   ALBUTEROL (VENTOLIN HFA) 108 (90 BASE) MCG/ACT INHALER    Inhale into the lungs.   ASPIRIN 81 MG TABLET    Take 81 mg by mouth daily.   LACOSAMIDE 100 MG TABS    Take 1 tablet (100 mg total) by mouth 2 (two) times daily.   LOVASTATIN (MEVACOR) 20 MG TABLET    Take 1 tablet (20 mg total) by mouth at bedtime.    Review of Systems  Constitutional: Positive for diaphoresis. Negative for fever, chills, appetite change and fatigue.  Respiratory: Negative for chest tightness and shortness of breath.   Cardiovascular: Negative for chest pain and palpitations.  Gastrointestinal: Positive for nausea and abdominal pain. Negative for vomiting.  Genitourinary: Positive for dysuria, hesitancy, urgency and frequency. Negative for hematuria and flank pain.  Neurological: Negative for dizziness and weakness.    Social History  Substance Use Topics  . Smoking status: Never Smoker   . Smokeless tobacco: Not on file  . Alcohol Use: 0.0 oz/week    0 Standard drinks or equivalent per week     Comment: once a month   Objective:   BP 106/72 mmHg  Pulse 70  Temp(Src)  98.1 F (36.7 C) (Oral)  Resp 16  Ht 5\' 2"  (1.575 m)  Wt 119 lb (53.978 kg)  BMI 21.76 kg/m2  SpO2 100%  LMP 02/15/2014  Physical Exam  General appearance: alert, well developed, well nourished, cooperative and in no distress Head: Normocephalic, without obvious abnormality, atraumatic Lungs: Respirations even and unlabored Extremities: No gross deformities Skin: Skin color, texture, turgor normal. No rashes seen  Psych: Appropriate mood and affect. Neurologic: Mental status: Alert, oriented to person, place, and time, thought content appropriate.  Results for orders placed or performed in visit on 12/29/14  POCT Urinalysis Dipstick  Result Value Ref Range   Color, UA yellow    Clarity, UA clear    Glucose, UA Negative    Bilirubin, UA Negative    Ketones, UA Negative    Spec Grav, UA 1.010    Blood, UA Trace, hemolyzed    pH, UA 6.0    Protein, UA Negative    Urobilinogen, UA 0.2    Nitrite, UA Negative    Leukocytes, UA moderate (2+) (A) Negative       Assessment & Plan:     1. Dysuria  - POCT Urinalysis Dipstick  2. Urinary tract infection without hematuria, site unspecified  - ciprofloxacin (CIPRO) 500 MG tablet; Take 1 tablet (500 mg total) by mouth 2 (two) times daily.  Dispense: 14 tablet; Refill: 0  3.  Dizziness Has ENT referral scheduled later this month and follow up neurology October 7th. Continue anti-seizure medications that were initiated in ER.  - Lacosamide 100 MG TABS; Take 1 tablet (100 mg total) by mouth 2 (two) times daily.  Dispense: 60 tablet; Refill: 1       Mila Merry, MD  [facility]

## 2014-12-29 NOTE — Telephone Encounter (Signed)
Please call in lacosamide.

## 2014-12-29 NOTE — Telephone Encounter (Signed)
Rx called in to pharmacy. 

## 2014-12-31 LAB — URINE CULTURE

## 2015-01-25 ENCOUNTER — Encounter: Payer: Self-pay | Admitting: Emergency Medicine

## 2015-01-25 ENCOUNTER — Emergency Department
Admission: EM | Admit: 2015-01-25 | Discharge: 2015-01-25 | Disposition: A | Discharge status: Home / Self Care | Payer: 59 | Attending: Emergency Medicine | Admitting: Emergency Medicine

## 2015-01-25 DIAGNOSIS — R202 Paresthesia of skin: Secondary | ICD-10-CM

## 2015-01-25 DIAGNOSIS — R200 Anesthesia of skin: Secondary | ICD-10-CM | POA: Diagnosis present

## 2015-01-25 DIAGNOSIS — Z79899 Other long term (current) drug therapy: Secondary | ICD-10-CM | POA: Diagnosis not present

## 2015-01-25 DIAGNOSIS — Z7951 Long term (current) use of inhaled steroids: Secondary | ICD-10-CM | POA: Insufficient documentation

## 2015-01-25 DIAGNOSIS — R42 Dizziness and giddiness: Secondary | ICD-10-CM | POA: Diagnosis not present

## 2015-01-25 DIAGNOSIS — Z7982 Long term (current) use of aspirin: Secondary | ICD-10-CM | POA: Diagnosis not present

## 2015-01-25 HISTORY — DX: Pure hypercholesterolemia, unspecified: E78.00

## 2015-01-25 LAB — BASIC METABOLIC PANEL
Anion gap: 8 (ref 5–15)
BUN: 12 mg/dL (ref 6–20)
CHLORIDE: 105 mmol/L (ref 101–111)
CO2: 27 mmol/L (ref 22–32)
CREATININE: 0.75 mg/dL (ref 0.44–1.00)
Calcium: 9.3 mg/dL (ref 8.9–10.3)
GFR calc Af Amer: >60 mL/min (ref >60)
GFR calc non Af Amer: >60 mL/min (ref >60)
Glucose, Bld: 99 mg/dL (ref 65–99)
Potassium: 3.7 mmol/L (ref 3.5–5.1)
SODIUM: 140 mmol/L (ref 135–145)

## 2015-01-25 LAB — URINALYSIS COMPLETE WITH MICROSCOPIC (ARMC ONLY)
Bacteria, UA: NONE SEEN
Bilirubin Urine: NEGATIVE
Glucose, UA: NEGATIVE mg/dL
Hgb urine dipstick: NEGATIVE
KETONES UR: NEGATIVE mg/dL
Leukocytes, UA: NEGATIVE
Nitrite: NEGATIVE
PROTEIN: NEGATIVE mg/dL
SQUAMOUS EPITHELIAL / LPF: NONE SEEN
Specific Gravity, Urine: 1.005 (ref 1.005–1.030)
pH: 6 (ref 5.0–8.0)

## 2015-01-25 LAB — CBC
HCT: 42.6 % (ref 35.0–47.0)
HEMOGLOBIN: 14.5 g/dL (ref 12.0–16.0)
MCH: 29.9 pg (ref 26.0–34.0)
MCHC: 34.1 g/dL (ref 32.0–36.0)
MCV: 87.7 fL (ref 80.0–100.0)
PLATELETS: 179 10*3/uL (ref 150–440)
RBC: 4.87 MIL/uL (ref 3.80–5.20)
RDW: 13.4 % (ref 11.5–14.5)
WBC: 5.1 10*3/uL (ref 3.6–11.0)

## 2015-01-25 LAB — TROPONIN I

## 2015-01-25 NOTE — Discharge Instructions (Signed)

## 2015-01-25 NOTE — ED Provider Notes (Signed)
[facility] Emergency Department Provider Note  Time seen: 4:57 PM  I have reviewed the triage vital signs and the nursing notes.   HISTORY  Chief Complaint Numbness    HPI Anne Swanson is a 54 y.o. female with a past medical history of asthma, hyperlipidemia, vertigo, presents the emergency department with left facial and arm tingling. According to the patient she has a long history of numbness/tingling symptoms. She has been worked up by neurology, has had EEGs, MRIs, with no findings per patient. Patient has also seen an ENT and has been diagnosed with vertigo/vestibular weakness. Patient states today she has felt somewhat more dizzy than normal, she saw her ENT and had mentioned that today she is also experiencing tingling in her left face and left arm. They asked the patient to speak with her neurologist regarding this, the patient called her neurologist and they referred to the emergency department for evaluation. Patient states a tingling sensation in her left face which has spread to her right face as well as her left arm. Denies any headache. Denies any focal weakness or numbness. She states she's had these symptoms before, including August when they lasted 2 days, but she also had an MRI in August which showed no findings, per patient.    Past Medical History  Diagnosis Date  . Fibrocystic breast disease   . Osteopenia   . Asthma   . High cholesterol     Patient Active Problem List   Diagnosis Date Noted  . Hematuria 12/02/2014  . 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 Fatigue 11/16/2014  . Fibrocystic breast disease 11/16/2014  . Anemia, iron deficiency 11/16/2014  . Osteopenia 11/16/2014  . Recurrent UTI 11/16/2014  . Closed rib fracture 11/16/2014  . Avitaminosis D 11/16/2014  . Dizziness 11/16/2014    Past Surgical History  Procedure Laterality Date  . Wisdom tooth extraction    . Cesarean section      x3    Current Outpatient Rx   Name  Route  Sig  Dispense  Refill  . albuterol (VENTOLIN HFA) 108 (90 BASE) MCG/ACT inhaler   Inhalation   Inhale into the lungs.         Marland Kitchen aspirin 81 MG tablet   Oral   Take 81 mg by mouth daily.         . Lacosamide 100 MG TABS   Oral   Take 1 tablet (100 mg total) by mouth 2 (two) times daily.   60 tablet   1   . lovastatin (MEVACOR) 20 MG tablet   Oral   Take 1 tablet (20 mg total) by mouth at bedtime.   30 tablet   1     Allergies Lodine  Family History  Problem Relation Age of Onset  . Coronary artery disease Father   . Cancer Maternal Uncle     stomach    Social History Social History  Substance Use Topics  . Smoking status: Never Smoker   . Smokeless tobacco: None  . Alcohol Use: 0.0 oz/week    0 Standard drinks or equivalent per week     Comment: once a month    Review of Systems Constitutional: Negative for fever. Cardiovascular: Negative for chest pain. Respiratory: Negative for shortness of breath. Gastrointestinal: Negative for abdominal pain Musculoskeletal: Negative for back pain. Skin: Negative for rash. Neurological: Negative for headaches, focal weakness or numbness. Tingling/paresthesias in face and left arm. 10-point ROS otherwise negative.  ____________________________________________   PHYSICAL  EXAM:  VITAL SIGNS: ED Triage Vitals  Enc Vitals Group     BP 01/25/15 1618 150/92 mmHg     Pulse Rate 01/25/15 1618 84     Resp 01/25/15 1618 16     Temp 01/25/15 1618 98.3 F (36.8 C)     Temp Source 01/25/15 1618 Oral     SpO2 01/25/15 1618 96 %     Weight 01/25/15 1618 115 lb (52.164 kg)     Height 01/25/15 1618  (1.575 m)     Head Cir --      Peak Flow --      Pain Score 01/25/15 1625 0     Pain Loc --      Pain Edu? --      Excl. in GC? --     Constitutional: Alert and oriented. Well appearing and in no distress. Eyes: Normal exam, PERRL, EOMI ENT   Head: Normocephalic and atraumatic.   Mouth/Throat:  Mucous membranes are moist. Cardiovascular: Normal rate, regular rhythm. No murmur Respiratory: Normal respiratory effort without tachypnea nor retractions. Breath sounds are clear and equal bilaterally. No wheezes/rales/rhonchi. Gastrointestinal: Soft and nontender. No distention.  Musculoskeletal: Nontender with normal range of motion in all extremities. No lower extremity tenderness or edema.  Neurologic:  Normal speech and language. No gross focal neurologic deficits are appreciated. Speech is normal. 5/5 motor in all extremities. No pronator drift. Cranial nerves intact, no facial droop. Denies sensory difference in upper extremities or face. Skin:  Skin is warm, dry and intact.  Psychiatric: Mood and affect are normal. Speech and behavior are normal.  ____________________________________________    INITIAL IMPRESSION / ASSESSMENT AND PLAN / ED COURSE  Pertinent labs & imaging results that were available during my care of the patient were reviewed by me and considered in my medical decision making (see chart for details).  Patient presents for facial tingling and left arm tingling. She states she's had these symptoms before, and only came because her neurologist suggested she do so. Patient has a normal neurologic exam currently. Also denies any differences in sensation currently. I have discussed with the patient that a CT is unlikely to show any abnormalities as I do not suspect intracranial hemorrhage. I suggested instead that we proceed with an MRI to evaluate for CVA. Patient states she has had lots of testing recently which has not shown a cause for her symptoms, and she does not want an MRI. She states she does not want any head imaging at this time and will follow up with her neurologist. Patient is agreeable to labs including blood and urine to evaluate her weakness/dizziness.  Labs including urinalysis are within normal limits. Patient will be discharged home with primary care  follow-up as well as neurology follow-up. Patient agreeable plan. Discussed my typical CVA return precautions.    ____________________________________________   FINAL CLINICAL IMPRESSION(S) / ED DIAGNOSES  Paresthesias   Minna Antis, MD 01/25/15 [phone]

## 2015-01-25 NOTE — ED Notes (Signed)
Patient presents to the ED with left sided numb feeling to her left side starting at 11am.  Patient's smile does not appear completely symmetrical.  Grip strength is strong and equal.  Patient is complaining of feeling "drunk and dizzy".  Patient is speaking in complete sentences without apparent difficulty, ambulatory to triage.  Patient was seen by ENT today for the dizziness that has been ongoing since August.  ENT instructed patient to call her neurologist about her weakness and neurologist instructed patient to come to the ED.

## 2015-01-28 ENCOUNTER — Telehealth: Payer: Self-pay | Admitting: Family Medicine

## 2015-01-28 NOTE — Telephone Encounter (Signed)
Pt called to ask if it is ok for her to start driving again?  Pt states per Dr Jenne CampusMcQueen has advised pt that her dizziness is from left inner ear weakness and pt will start rehab next week.  Dr Sherryll BurgerShah states pt is having migraines and does to think this is due to seizures.  Pt is want to get Dr Sherrie MustacheFisher ok before she states back driving.  [phone]/WJCB#[phone]/MW

## 2015-01-29 NOTE — Telephone Encounter (Signed)
i wouldn't want her to have dizzy spell while driving, so it just depends how often and how severe the spells. If she is still having them frequently she should probably wait until rehab starts and dizziness resolves.

## 2015-01-29 NOTE — Telephone Encounter (Signed)
Patient notified. Expressed understanding.

## 2015-02-01 ENCOUNTER — Ambulatory Visit: Payer: 59 | Attending: Unknown Physician Specialty | Admitting: Physical Therapy

## 2015-02-01 DIAGNOSIS — R42 Dizziness and giddiness: Secondary | ICD-10-CM | POA: Insufficient documentation

## 2015-02-01 NOTE — Therapy (Signed)
[facility] MAIN [facility] SERVICES [address] [place], [place], [address] Phone: [phone]   Fax:  [phone]  Physical Therapy Evaluation  Patient Details  Name: Anne Swanson MRN: [id number] Date of Birth: September 25, 1960 No Data Recorded  Encounter Date: 02/01/2015      PT End of Session - 02/02/15 1529    Visit Number 1   Number of Visits 8   Date for PT Re-Evaluation 04/02/15   PT Start Time 1400   PT Stop Time 1450   PT Time Calculation (min) 50 min   Equipment Utilized During Treatment Gait belt   Activity Tolerance Patient tolerated treatment well   Behavior During Therapy [facility] for tasks assessed/performed      Past Medical History  Diagnosis Date  . Fibrocystic breast disease   . Osteopenia   . Asthma   . High cholesterol     Past Surgical History  Procedure Laterality Date  . Wisdom tooth extraction    . Cesarean section      x3    There were no vitals filed for this visit.  Visit Diagnosis:  Dizziness and giddiness      VESTIBULAR AND BALANCE EVALUATION  Onset Date: July 2016  HISTORY:  Pt reports that 7 years ago she did not have any vertigo and her iron levels were very low and it took 6 months to correct the dizziness had resolved. She had not had any further episodes until July of this year. Pt reports woke up feeling bad, felt "drunk" for a few weeks and then August 17th she began to have vertigo. Pt went to see Dr. Sherrie Mustache in regards to the dizziness and pt has been seen by ENT and neurologist physicians. Pt reports she began to have numbness on left arm, leg and face and pt went to the ER twice. Pt reports that Dr. Clelia Croft felt this was caused by migraines. Pt reports that she has new onset migraines. Pt had an MRI August 22nd, US carotid, EEG and 2 EKG. Pt reports all the tests were negative. Pt reports seven years ago she had bad tinnitus and now reports rare. Did have aural fullness but that has resloved.   Symptoms at onset: vertigo, dizziness  Description of dizziness: (vertigo, unsteadiness, lightheadedness, falling, general unsteadiness, aural fullness); currently not having vertigo since starting prednisone (prednisone ended 2 days ago- states she was " a new person on that stuff" , now floaty sensation, veering, lightheadedness, unsteadiness and feels lousy like she has the flu. difficulty concentrating Symptom nature: (motion provoked/positional/spontaneous/constant, variable, intermittent)  Frequency: daily episodes Duration: dizziness lasts few hours and sometimes only 5-10 minutes   Provocative Factors:none that she is aware except quick head turns makes her feel worse Easing Factors: prednisone, vimpat (anti seizure)  Progression of symptoms: (better, worse, no change since onset) pt reports that her symptoms are worse because now it is daily her symptoms.  History of similar episodes: one but it was different Falls (yes/no): yes Number of falls in past 6 months: 1  Subjective Prior Functional Level: pt reports she has not been socializing as much and not been driving due to her symptoms. Pt reports independent with ADLs, and household chores but states she is not as fast as she used to be.   Auditory complaints (tinnitus, pain, drainage): denies any acute changes, reports chronic tinnitus that is pt states is much better as compared to several years ago.  Vision: (last eye exam, diplopia, recent  changes) denies any recent changes in vision, blurry vision, double vision or dark spots in vision.    EXAMINATION:  POSTURE: Demonstrates good sitting and standing posture without forward trunk flexion and without forward head posture.   NEUROLOGICAL SCREEN: (2+ unless otherwise noted.) N=normal  Ab=abnormal  Level Dermatome R L Myotome R L  C3 Anterior Neck N N Sidebend C2-3    C4 Top of Shoulder N N Shoulder Shrug C4    C5 Lateral Upper Arm N N Shoulder ABD C4-5    C6 Lateral Arm/  Thumb N N Arm Flex/ Wrist Ext C5-6    C7 Middle Finger N N Arm Ext//Wrist Flex C6-7    C8 4th & 5th Finger N N Flex/ Ext Carpi Ulnaris C8    T1 Medial Arm N N Interossei T1    L2 Medial thigh/groin N N Illiopsoas (L2-3)    L3 Lower thigh/med.knee N N Quadriceps (L3-4)    L4 Medial leg/lat thigh N N Tibialis Ant (L4-5)    L5 Lat. leg & dorsal foot N N EHL (L5)    S1 post/lat foot/thigh/leg N N Gastrocnemius (S1-2)    S2 Post./med. thigh & leg N N Hamstrings (L4-S3)      SOMATOSENSORY:        Sensation           Intact      Diminished         Absent  Light touch N    Any N & T in extremities or weakness: feels left arm does not feel the same as right arm      COORDINATION:Finger to Nose:   Normal Past Pointing:   Left minimal initially P   MUSCULOSKELETAL SCREEN: Cervical Spine ROM: SB L/R limited with discomfort; WNL R/L Rotation and flex/ext  Functional Mobility:  I with sit to stand transfers and gait.   Gait: Pt ambulates without AD with fair cadence with step through gait pattern. Pt able to step over objects on floor and able to ascend/descend steps without rail with reciprocating gait pattern.  Scanning of visual environment with gait is: good  Balance:  Pt demonstrates mild difficulty with ambulation with horiz head turns. Pt demonstrates difficulty with EC on uneven surfaces.    POSTURAL CONTROL TESTS:  Clinical Test of Sensory Interaction for Balance    (CTSIB):  CONDITION TIME STRATEGY SWAY  Eyes open, firm surface                30 sec ankle   Eyes closed, firm surface 30 sec ankle +1  Eyes open, foam surface 30 sec ankle   Eyes closed, foam surface 30 sec ankle +2    OCULOMOTOR / VESTIBULAR TESTING: Left eye does not converge as well as right eye  Oculomotor Exam- Room Light  Normal Abnormal Comments  Ocular Alignment N    Ocular ROM N    Spontaneous Nystagmus N    End-Gaze Nystagmus N  Appropriate for age  Smooth Pursuit N    Saccades N    VOR N   Denied symptoms with VOR test however pt reports blurring of target and dizziness with doing VOR exercise  VOR Cancellation N    Left Head Thrust N    Right Head Thrust N    Head Shaking Nystagmus N  Mild dizziness; no nystagmus present    FUNCTIONAL OUTCOME MEASURES:  Results Comments  DHI 24/25 Safe for community mobility  ABC Scale 71,87% Fall risk; in need of  intervention  DGI 42 Moderate perception of handicap; in need of intervention   Neuromuscular Reeducation: Demonstrated and discussed VOR X 1 exercise. Pt performed VOR X 1 horiz in sitting one rep of 30 seconds and then 2 reps of 1 minute each. Pt reports 4/10 dizziness and mild blurring of target with exercise. Issued VOR for HEP and handout provided.       PT Education - 02/02/15 1528    Education provided Yes   Education Details POC, VOR exercise   Person(s) Educated Patient   Methods Explanation;Handout   Comprehension Verbalized understanding;Returned demonstration             PT Long Term Goals - 02/02/15 1552    PT LONG TERM GOAL #1   Title Patient will have demonstrate decreased falls risk as indicated by Activities Specific Balance Confidence Scale score of 80% or greater.   Baseline Scored 71.8% on 10/17   Time 8   Period Weeks   Status New   PT LONG TERM GOAL #2   Title Patient will be able to perform home program independently for self-management.   Time 8   Period Weeks   Status New   PT LONG TERM GOAL #3   Title Patient will reduce perceived disability to low levels as indicated by <40 on Dizziness Handicap Inventory.   Baseline Scored 42 on 10/17   Time 8   Period Weeks   Status New   PT LONG TERM GOAL #5   Title Pt will report that she has been able to resume all of her prior levels of activity without difficulty and symptoms.    Baseline Pt reports that she is not socializing or driving due to her symptoms.    Time 8   Period Weeks   Status New               Plan - 02/02/15  1530    Clinical Impression Statement Per ENT physician note, pt had a VNG test performed in the office and revealed an 88% left caloric weakness. Pt demonstrates good balance in the clinic but [facility] surfaces and with ambulation with head turns activities. Pt would benefit from PT services to address deficits and goals as set on POC. Pt would like to be able to resume all of her prior actvities including driving.    Pt will benefit from skilled therapeutic intervention in order to improve on the following deficits Dizziness;Decreased balance   Rehab Potential Good   PT Frequency 1x / week   PT Duration 8 weeks   PT Treatment/Interventions Vestibular;Canalith Repostioning;Patient/family education;Neuromuscular re-education;Balance training;Therapeutic exercise;Gait training   PT Next Visit Plan Review VOR exercise and work on progressions. Begin ambulation with head turns, uneven surfaces with head turns and EO/EC on foam.   Consulted and Agree with Plan of Care Patient         Problem List Patient Active Problem List   Diagnosis Date Noted  . Hematuria 12/02/2014  . 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 Fatigue 11/16/2014  . Fibrocystic breast disease 11/16/2014  . Anemia, iron deficiency 11/16/2014  . Osteopenia 11/16/2014  . Recurrent UTI 11/16/2014  . Closed rib fracture 11/16/2014  . Avitaminosis D 11/16/2014  . Dizziness 11/16/2014   Mardelle Matteorriea Aiman Noe PT, DPT Dawaun Brancato 02/02/2015, 3:55 PM  [facility] [facility] [address] [place], [place], [address] Phone: [phone]   Fax:  [phone]  Name: Anne Swanson MRN:  [id number] Date of Birth: [birth year]

## 2015-02-02 ENCOUNTER — Encounter: Payer: Self-pay | Admitting: Physical Therapy

## 2015-02-09 ENCOUNTER — Other Ambulatory Visit: Payer: Self-pay | Admitting: Family Medicine

## 2015-02-15 ENCOUNTER — Encounter: Payer: Self-pay | Admitting: Physical Therapy

## 2015-02-15 ENCOUNTER — Ambulatory Visit: Payer: 59

## 2015-02-15 DIAGNOSIS — R42 Dizziness and giddiness: Secondary | ICD-10-CM | POA: Diagnosis not present

## 2015-02-15 NOTE — Patient Instructions (Addendum)
  Feet Heel-Toe "Tandem", Head Motion - Eyes Open    With eyes open, right foot directly in front of the other, move head slowly: left and right. Alternate which foot is forward Repeat _3___ times per session. Do __3__ sessions per day.  Copyright  VHI. All rights reserved.

## 2015-02-15 NOTE — Therapy (Signed)
[place] [facility] MAIN [facility] SERVICES [address] [place], [place], [address] Phone: [phone]   Fax:  [phone]  Physical Therapy Treatment  Patient Details  Name: Anne Swanson MRN: [id number] Date of Birth: 03/03/1961 No Data Recorded  Encounter Date: 02/15/2015      PT End of Session - 02/15/15 1221    Visit Number 2   Number of Visits 8   Date for PT Re-Evaluation 04/02/15   PT Start Time 1125   PT Stop Time 1210   PT Time Calculation (min) 45 min   Equipment Utilized During Treatment Gait belt   Activity Tolerance Patient tolerated treatment well   Behavior During Therapy [facility] for tasks assessed/performed      Past Medical History  Diagnosis Date  . Fibrocystic breast disease   . Osteopenia   . Asthma   . High cholesterol     Past Surgical History  Procedure Laterality Date  . Wisdom tooth extraction    . Cesarean section      x3    There were no vitals filed for this visit.  Visit Diagnosis:  Dizziness and giddiness      Subjective Assessment - 02/15/15 1220    Subjective Pt reports that she has noticed significant improvement in her symptoms since starting VOR exercise. She is performing daily and has been able to progress to performing in standing. Pt has no specific questions or concerns at this time.    Currently in Pain? No/denies       Neuromuscular Re-education   VOR VOR x 1 horizontal in sitting x 60 seconds; VOR x 1 horizontal standing WBOS x 60 seconds; VOR x 1 horizontal standing NBOS x 60 seconds VOR x 1 horizontal standing WBOS on Airex x 60 seconds; VOR x 1 horizontal standing NBOS on Airex x 60 seconds; Reports very mild "lightheaded" sensation with exercises. Unable to rate on 0-10 scale.   Airex NBOS eyes open x 30 seconds, closed x 30 seconds; NBOS horizontal head turns followed by head and body turns x 30 seconds; NBOS vertical and horizontal ball tosses x 30 seconds each; Semi-tandem  eyes open x 30 seconds, closed x 30 seconds; Semi-tandem horizontal head turns followed by head and body turns x 30 seconds; Firm tandem horizontal head and body turns x 30 seconds each (issued to HEP); Pt denies dizziness with all Airex activities.   Hallway Ambulation Forward ambulation with horizontal and vertical head turns x 85' each; Forward ambulation with vertical and lateral ball tosses 85' x 3; Retro ambulation with ball pass over shoulder x 85'; Ambulation in hallway with quick turns on command 85' x 2; Pt denies dizziness with all hallway ambulation activities.                           PT Education - 02/15/15 1219    Education provided Yes   Education Details HEP progression   Person(s) Educated Patient   Methods Explanation;Demonstration   Comprehension Verbalized understanding;Returned demonstration             PT Long Term Goals - 02/02/15 1552    PT LONG TERM GOAL #1   Title Patient will have demonstrate decreased falls risk as indicated by Activities Specific Balance Confidence Scale score of 80% or greater.   Baseline Scored 71.8% on 10/17   Time 8   Period Weeks   Status New   PT LONG TERM GOAL #  2   Title Patient will be able to perform home program independently for self-management.   Time 8   Period Weeks   Status New   PT LONG TERM GOAL #3   Title Patient will reduce perceived disability to low levels as indicated by <40 on Dizziness Handicap Inventory.   Baseline Scored 42 on 10/17   Time 8   Period Weeks   Status New   PT LONG TERM GOAL #5   Title Pt will report that she has been able to resume all of her prior levels of activity without difficulty and symptoms.    Baseline Pt reports that she is not socializing or driving due to her symptoms.    Time 8   Period Weeks   Status New               Plan - 02/15/15 1221    Clinical Impression Statement Pt reports significant improvement in symptoms since starting  therapy. Pt reports some mild lighheadedness with VOR x 1 exercise. Otherwise unable to provoke symptoms with head turns, ball tosses, head turns with ambulation, or quick turns. In course of history pt endorses a fall with head trauma but no LOC shortly before onset of symptoms in July. Upon questioning pt also reports tick bite 7 years ago prior to initial symptoms which presented with L sided swelling for which she had to take Lasix. Pt encouraged to report this to her PCP. Pt provided progression of vestibular exercises and encouraged to follow-up as scheduled.    Pt will benefit from skilled therapeutic intervention in order to improve on the following deficits Dizziness;Decreased balance   Rehab Potential Good   PT Frequency 1x / week   PT Duration 8 weeks   PT Treatment/Interventions Vestibular;Canalith Repostioning;Patient/family education;Neuromuscular re-education;Balance training;Therapeutic exercise;Gait training   PT Next Visit Plan Review VOR exercise progression, perform VOR with forward and retro ambulation. Continue tandem head turn progression.   PT Home Exercise Plan VOR x 1 NBOS on foam, semi-tandem/tandem progressions with head and head/body turns   Consulted and Agree with Plan of Care Patient        Problem List Patient Active Problem List   Diagnosis Date Noted  . Hematuria 12/02/2014  . 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 Fatigue 11/16/2014  . Fibrocystic breast disease 11/16/2014  . Anemia, iron deficiency 11/16/2014  . Osteopenia 11/16/2014  . Recurrent UTI 11/16/2014  . Closed rib fracture 11/16/2014  . Avitaminosis D 11/16/2014  . Dizziness 11/16/2014   Lynnea MaizesJason D Georgeann Brinkman PT, DPT   Maddoxx Burkitt 02/15/2015, 12:31 PM  [place] [facility] MAIN [facility] [address] [place], [place], [address] Phone: [phone]   Fax:  [phone]  Name: Anne Swanson MRN: [id number] Date of Birth: 05-Oct-1960

## 2015-02-22 ENCOUNTER — Ambulatory Visit: Payer: 59 | Attending: Unknown Physician Specialty

## 2015-02-22 ENCOUNTER — Encounter: Payer: Self-pay | Admitting: Physical Therapy

## 2015-02-22 DIAGNOSIS — R42 Dizziness and giddiness: Secondary | ICD-10-CM

## 2015-02-22 NOTE — Therapy (Signed)
[place] [facility] MAIN [facility] SERVICES [address] [place], [place], [address] Phone: [phone]   Fax:  [phone]  Physical Therapy Treatment  Patient Details  Name: Anne Swanson MRN: [id number] Date of Birth: 09-09-1960 No Data Recorded  Encounter Date: 02/22/2015      PT End of Session - 02/22/15 1258    Visit Number 3   Number of Visits 8   Date for PT Re-Evaluation 04/02/15   PT Start Time 1120   PT Stop Time 1200   PT Time Calculation (min) 40 min   Equipment Utilized During Treatment Gait belt   Activity Tolerance Patient tolerated treatment well   Behavior During Therapy [facility] for tasks assessed/performed      Past Medical History  Diagnosis Date  . Fibrocystic breast disease   . Osteopenia   . Asthma   . High cholesterol     Past Surgical History  Procedure Laterality Date  . Wisdom tooth extraction    . Cesarean section      x3    There were no vitals filed for this visit.  Visit Diagnosis:  Dizziness and giddiness      Subjective Assessment - 02/22/15 1116    Subjective Pt states that she felt achy, tired, and weak since the last session. Pt denies chills and fever. She has had no further episodes of vertigo. Pt reports she did notice some double vision while performing VOR exercise. Pt does complain of some central facial numbness, "right down the middle of my face" with VOR exercise.   Currently in Pain? Yes   Pain Score 4    Pain Location Neck   Pain Orientation Right;Posterior   Pain Descriptors / Indicators Aching   Pain Type Chronic pain   Pain Onset More than a month ago  Started 2013      Neuromuscular Re-education   VOR VOR x 1 horizontal standing NBOS on Airex 2 x 60 seconds; VOR x 1 horizontal with forward/retro ambulation 35' x 3 (added to HEP); Reports some "facial numbness" with exercise but denies dizziness.   Head Turn Progressions Firm tandem horizontal head and body turns x 30  seconds each, pt denies dizziness; Firm tandem vertical head turns x 30 seconds, pt denies dizziness Head to knee/ceiling 30 seconds, pt reports 2/10 dizziness to R knee and 0/10 dizziness to L knee (added to HEP); Airex balance beam side stepping with horizontal head turns; Airex tandem balance with horizontal head and head/body turns x 30 seconds each, denies dizziness with Airex balance beam activities.  Hallway Ambulation Forward ambulation with vertical and lateral ball tosses x 85' each; Retro ambulation with ball pass over shoulder 85' x 2 (6/10 dizziness), pt reports "I feel fuzzy all over."  Airex Ball tosses in doorway of [facility] center NBOS Airex from side to side x 30 seconds, low to high x 30 seconds to each side (6/10 dizziness);                           PT Education - 02/22/15 1258    Education provided Yes   Education Details HEP progression   Person(s) Educated Patient   Methods Explanation;Demonstration   Comprehension Verbalized understanding;Returned demonstration             PT Long Term Goals - 02/02/15 1552    PT LONG TERM GOAL #1   Title Patient will have demonstrate decreased falls risk as indicated  by Activities Specific Balance Confidence Scale score of 80% or greater.   Baseline Scored 71.8% on 10/17   Time 8   Period Weeks   Status New   PT LONG TERM GOAL #2   Title Patient will be able to perform home program independently for self-management.   Time 8   Period Weeks   Status New   PT LONG TERM GOAL #3   Title Patient will reduce perceived disability to low levels as indicated by <40 on Dizziness Handicap Inventory.   Baseline Scored 42 on 10/17   Time 8   Period Weeks   Status New   PT LONG TERM GOAL #5   Title Pt will report that she has been able to resume all of her prior levels of activity without difficulty and symptoms.    Baseline Pt reports that she is not socializing or driving due to her symptoms.     Time 8   Period Weeks   Status New               Plan - 02/22/15 1258    Clinical Impression Statement Difficult to provoke symptoms today with patient. Most aggravating activities are retro ambulation with ball passes over shoulder as well as standing ball tosses in door frame with quick head turns from side to side and low to high. HEP progressed on this visit and pt encouraged to follow-up as scheduled.    Pt will benefit from skilled therapeutic intervention in order to improve on the following deficits Dizziness;Decreased balance   Rehab Potential Good   PT Frequency 1x / week   PT Duration 8 weeks   PT Treatment/Interventions Vestibular;Canalith Repostioning;Patient/family education;Neuromuscular re-education;Balance training;Therapeutic exercise;Gait training   PT Next Visit Plan VOR x 1 horizontal with forward/retro ambulation, consider vertical. Retro ambulation ball passes, doorway ball toss. Attempt high to low turns on Airex reaching overhead   PT Home Exercise Plan VOR x 1 forward/retro ambulation, semi-tandem/tandem progressions with head and head/body turns, seated head to knee/ceiling   Consulted and Agree with Plan of Care Patient        Problem List Patient Active Problem List   Diagnosis Date Noted  . Hematuria 12/02/2014  . 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 Fatigue 11/16/2014  . Fibrocystic breast disease 11/16/2014  . Anemia, iron deficiency 11/16/2014  . Osteopenia 11/16/2014  . Recurrent UTI 11/16/2014  . Closed rib fracture 11/16/2014  . Avitaminosis D 11/16/2014  . Dizziness 11/16/2014   Lynnea MaizesJason D Chelsa Stout PT, DPT   Samad Thon 02/22/2015, 1:02 PM  [place] [facility] [address] [place], [place], [address] Phone: [phone]   Fax:  [phone]  Name: Howell RucksHope A Lozinski MRN: [id number] Date of Birth: [birth year]

## 2015-03-01 ENCOUNTER — Ambulatory Visit (INDEPENDENT_AMBULATORY_CARE_PROVIDER_SITE_OTHER): Payer: 59 | Admitting: Family Medicine

## 2015-03-01 ENCOUNTER — Ambulatory Visit: Payer: 59 | Admitting: Physical Therapy

## 2015-03-01 ENCOUNTER — Encounter: Payer: Self-pay | Admitting: Family Medicine

## 2015-03-01 ENCOUNTER — Encounter: Payer: Self-pay | Admitting: Physical Therapy

## 2015-03-01 VITALS — BP 108/70 | HR 72 | Temp 98.5°F | Resp 16 | Wt 123.0 lb

## 2015-03-01 DIAGNOSIS — R42 Dizziness and giddiness: Secondary | ICD-10-CM | POA: Diagnosis not present

## 2015-03-01 DIAGNOSIS — E785 Hyperlipidemia, unspecified: Secondary | ICD-10-CM | POA: Diagnosis not present

## 2015-03-01 NOTE — Progress Notes (Signed)
Patient ID: Anne Swanson, female   DOB: February 27, 1961, 54 y.o.   MRN: [id number]       Patient: Anne Swanson Female    DOB: February 27, 1961   54 y.o.   MRN: [id number] Visit Date: 03/01/2015  Today's Provider: Mila Merryonald Fisher, MD   Chief Complaint  Patient presents with  . Dizziness    F/U    Subjective:    Dizziness This is a recurrent problem. The current episode started more than 1 month ago. The problem occurs intermittently. The problem has been unchanged. Pertinent negatives include no congestion, coughing, fever, joint swelling, myalgias, neck pain, visual change, vomiting or weakness. The symptoms are aggravated by bending and walking. Treatments tried: Patient currently goes to thearpy for symptoms . She reports that this has helped.  She has been doing well with PT. She feels a little weak and dizzy after sessions, but otherwise has had no dizzy episodes for at least the last month.    Hyperlipidemia She was started on lovastatin after MRI as read as having white matter changes likely ischemic. But Dr. Sherryll BurgerShah felt findings were likely physiologic. She stopped lovastatin and Vimpat several weeks ago because she felt extremely fatigued. She has continued taking aspirin.   Lab Results  Component Value Date   CHOL 248* 06/11/2014   HDL 54 06/11/2014   LDLCALC 175 06/11/2014   TRIG 93 06/11/2014        Allergies  Allergen Reactions  . Lodine [Etodolac] Other (See Comments)    Facial numbness   Previous Medications   ALBUTEROL (VENTOLIN HFA) 108 (90 BASE) MCG/ACT INHALER    Inhale into the lungs.   ASPIRIN 81 MG TABLET    Take 81 mg by mouth daily.   family history includes Cancer in her maternal uncle; Coronary artery disease in her father. Family Status  Relation Status Death Age  . Mother Deceased 8663    Breast cancer and colon cancer  . Father Deceased 8648    died from MI  . Brother Deceased 2137    fdied from Leukemia     Review of Systems  Constitutional:  Negative for fever.  HENT: Negative for congestion.   Respiratory: Negative for cough.   Gastrointestinal: Negative for vomiting.  Musculoskeletal: Negative for myalgias, joint swelling and neck pain.  Neurological: Positive for dizziness. Negative for weakness.    Social History  Substance Use Topics  . Smoking status: Never Smoker   . Smokeless tobacco: Not on file  . Alcohol Use: 0.0 oz/week    0 Standard drinks or equivalent per week     Comment: once a month   Objective:   BP 108/70 mmHg  Pulse 72  Temp(Src) 98.5 F (36.9 C)  Resp 16  Wt 123 lb (55.792 kg)  LMP 02/15/2014  Physical Exam  General appearance: alert, well developed, well nourished, cooperative and in no distress Head: Normocephalic, without obvious abnormality, atraumatic Lungs: Respirations even and unlabored Extremities: No gross deformities Skin: Skin color, texture, turgor normal. No rashes seen  Psych: Appropriate mood and affect. Neurologic: Mental status: Alert, oriented to person, place, and time, thought content appropriate.     Assessment & Plan:     1. Dizziness Mostly resolved with physical therapy.   2. Hyperlipidemia Unclear if white matter changes on MRI were ischemic or physiological. Recommend she continue ASA. Will stay off of lovastatin for the time being. Continue to monitor lipids, will check when she returns for annual physical in February.  Lelon Huh, MD  [facility]

## 2015-03-01 NOTE — Therapy (Signed)
[place] MA[place] [facility] SERVICES [address] [place], [place], [address] Phone: [phone]   Fax:  [phone]  Physical Therapy Treatment  Patient Details  Name: Anne Swanson MRN: [id number] Date of Birth: [birth year] No Data Recorded  Encounter Date: 03/01/2015      PT End of Session - 03/01/15 1327    Visit Number 4   Number of Visits 8   Date for PT Re-Evaluation 04/02/15   PT Start Time 1140   PT Stop Time 1218   PT Time Calculation (min) 38 min   Activity Tolerance Patient tolerated treatment well   Behavior During Therapy [facility] for tasks assessed/performed      Past Medical History  Diagnosis Date  . Fibrocystic breast disease   . Osteopenia   . Asthma   . High cholesterol     Past Surgical History  Procedure Laterality Date  . Wisdom tooth extraction    . Cesarean section      x3    There were no vitals filed for this visit.  Visit Diagnosis:  Dizziness and giddiness      Subjective Assessment - 03/01/15 1145    Subjective Pt states she is feeling much better. Pt states 2 days this past week she felt "normal". Pt states she felt great several days.  Pt states that she did experience some numbness at times this past week and states she is having numbness a little today down the center of her face and her right lower leg. Pt states that she was told by her physician that the numbness was due to migraines. Pt states she does not get weakness when she has the numbness. Pt reports that she is going to her physician today. Pt states she is hoping to be able to start driving again and will discuss with her physician. Pt states she has not had any "spinning" sensation in over a month now.    Currently in Pain? --  none stated      Neuromuscular Re-education:   VOR exercise: In standing on firm surface, pt performed VOR X 1 horiz 2 reps of 1 minute each with conflicting background.  Performed multiple trials of forward  ambulation 35' while doing horiz VOR X 1 viewing with conflicting background.   Diona Foley toss over shoulder:  Pt performed multiple 23' trials of forward and retro ambulation while tossing ball over one shoulder with return catch over opposite shoulder at shoulder level and then with varying the ball position to head, shoulder and waist level to promote head turning and tilting.  Bounce Passes: Performed ambulation 12' trials while doing alternating sides bounce passes to self with ball while tracking ball with eyes and head.   Hallway ball toss: In hallway, worked on ball toss against one wall with alternating quick turns to toss ball against opposite wall.   Airex pad: On firm surface and then on Airex pad, pt performed slow marching with 3-5 second holds with and without opposite arm flexion and then added with head turns side to side.        PT Education - 03/01/15 1327    Education provided Yes   Education Details HEP progression   Person(s) Educated Patient   Methods Explanation;Demonstration;Handout   Comprehension Verbalized understanding;Returned demonstration             PT Long Term Goals - 03/01/15 1328    PT LONG TERM GOAL #1   Title Patient will have demonstrate  decreased falls risk as indicated by Activities Specific Balance Confidence Scale score of 80% or greater.   Baseline Scored 71.8% on 10/17   Time 8   Period Weeks   Status On-going   PT LONG TERM GOAL #2   Title Patient will be able to perform home program independently for self-management.   Time 8   Period Weeks   Status Achieved   PT LONG TERM GOAL #3   Title Patient will reduce perceived disability to low levels as indicated by <40 on Dizziness Handicap Inventory.   Baseline Scored 42 on 10/17   Time 8   Period Weeks   Status On-going   PT LONG TERM GOAL #5   Title Pt will report that she has been able to resume all of her prior levels of activity without difficulty and symptoms.    Baseline  Pt reports that she is not socializing or driving due to her symptoms.    Time 8   Period Weeks   Status Partially Met               Plan - 03/01/15 1329    Clinical Impression Statement Pt reporting significant improvement in her overall symptoms. Progressed vestibulo-ocular and vestibular exercises this date. Pt reports she is hoping to resume driving soon and is going to see her physician today for a follow-up appointment. Will continue to work towards goals as set on [place].    Pt will benefit from skilled therapeutic intervention in order to improve on the following deficits Dizziness;Decreased balance   Rehab Potential Good   PT Frequency 1x / week   PT Duration 8 weeks   PT Treatment/Interventions Vestibular;Canalith Repostioning;Patient/family education;Neuromuscular re-education;Balance training;Therapeutic exercise;Gait training   PT Next Visit Plan Consider retesting functional outcome measures next visit.    PT Home Exercise Plan VOR x 1 forward/retro ambulation, semi-tandem/tandem progressions with head and head/body turns, seated head to knee/ceiling   Consulted and Agree with Plan of Care Patient        Problem List Patient Active Problem List   Diagnosis Date Noted  . Hematuria 12/02/2014  . 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 Fatigue 11/16/2014  . Fibrocystic breast disease 11/16/2014  . Anemia, iron deficiency 11/16/2014  . Osteopenia 11/16/2014  . Recurrent UTI 11/16/2014  . Closed rib fracture 11/16/2014  . Avitaminosis D 11/16/2014  . Dizziness 11/16/2014   Lady Deutscher PT, DPT Lady Deutscher 03/01/2015, 2:39 PM  [place] MAIN [facility] SERVICES [address] [place], [place], [address] Phone: [phone]   Fax:  [phone]  Name: Anne Swanson MRN: [id number] Date of Birth: 06-14-1960

## 2015-03-08 ENCOUNTER — Encounter: Payer: Self-pay | Admitting: Physical Therapy

## 2015-03-08 ENCOUNTER — Ambulatory Visit: Payer: 59 | Admitting: Physical Therapy

## 2015-03-08 DIAGNOSIS — R42 Dizziness and giddiness: Secondary | ICD-10-CM | POA: Diagnosis not present

## 2015-03-08 NOTE — Therapy (Signed)
[place] MAIN [facility] SERVICES [address] [place], [place], [address] Phone: [phone]   Fax:  [phone]  Physical Therapy Treatment/Discharge Summary  Patient Details  Name: Anne Swanson MRN: [id number] Date of Birth: 28-Apr-1960 No Data Recorded  Encounter Date: 03/08/2015      PT End of Session - 03/08/15 1153    Visit Number 5   Number of Visits 8   Date for PT Re-Evaluation 04/02/15   PT Start Time 3428   PT Stop Time 1215   PT Time Calculation (min) 30 min   Equipment Utilized During Treatment Gait belt   Activity Tolerance Patient tolerated treatment well   Behavior During Therapy [facility] for tasks assessed/performed      Past Medical History  Diagnosis Date  . Fibrocystic breast disease   . Osteopenia   . Asthma   . High cholesterol     Past Surgical History  Procedure Laterality Date  . Wisdom tooth extraction    . Cesarean section      x3    There were no vitals filed for this visit.  Visit Diagnosis:  Dizziness and giddiness      Subjective Assessment - 03/08/15 1149    Subjective Pt states she felt bad for 2-3 days after last therapy visit, but then had 2 days where she felt normal and great. Pt states she has not had any spinning but has been getting weak and "yucky" feeling a few days this past week. Pt reports that she got cleared for driving this past week and that she has been doing so without diffiuclty and without dizziness. Pt states she has been having daily neck pain since July.    Currently in Pain? Yes   Pain Location Neck   Pain Orientation Posterior   Pain Descriptors / Indicators Aching   Pain Type Chronic pain   Pain Onset More than a month ago      Pt completed repeat functional outcome measures including the DHI and ABC scale. Discussed test results and compared to prior testing. Discussed patient's progress and discussed discharge plans. Pt plans on continuing to perform her HEP  which was reviewed with patient. Pt might also benefit from PT services for her neck as she has reports of chronic posterior neck pain since July which might be impacting her. Pt has met all goals as set on POC and has resumed her prior activities without difficulty. Pt in agreement to discharge from vestibular PT therapy at this time.         PT Education - 03/08/15 1153    Education provided Yes   Education Details Discussed functional outcome retesting and POC.    Person(s) Educated Patient   Methods Explanation   Comprehension Verbalized understanding             PT Long Term Goals - 03/08/15 1514    PT LONG TERM GOAL #1   Title Patient will have demonstrate decreased falls risk as indicated by Activities Specific Balance Confidence Scale score of 80% or greater.   Baseline Scored 71.8% on 10/17; scored 96.2% on 11/21   Time 8   Period Weeks   Status Achieved   PT LONG TERM GOAL #2   Title Patient will be able to perform home program independently for self-management.   Time 8   PT LONG TERM GOAL #3   Title Patient will reduce perceived disability to low levels as indicated by <40 on Dizziness Handicap  Inventory.   Baseline Scored 42 on 10/17; scored 16 on 11/21   Time 8   Period Weeks   Status Achieved   PT LONG TERM GOAL #5   Title Pt will report that she has been able to resume all of her prior levels of activity without difficulty and symptoms.    Baseline Pt reports that she is not socializing or driving due to her symptoms. Pt reports that she has increased her activity level and has been driving for the past week without difficulties.    Time 8   Period Weeks   Status Achieved               Plan - 03/08/15 1516    Clinical Impression Statement Pt continues to report improvement in her overall symptoms and states she has not had any vertigo this past week. Pt has resumed driving after being cleared by her physician last week. Repeated functional outcome  measures this past week and pt has made significant improvements. Pt improved from moderate to now low perception of handicap on the [facility] scoring a 16. In addition, pt improved from 71% to 96.2% on the ABC scale which is normal. Pt has met all goals as set on POC. Pt in agreement for discharge from vestibular therapy at this time. Pt does however report that she has been having persistent neck pain and was noted to have limitations in neck ROM that might benefit from PT services to address her neck symptoms. Encouraged pt to follow-up with her primary care physician in regards to this issue and to obtain a new PT perscription if indicated.    Pt will benefit from skilled therapeutic intervention in order to improve on the following deficits Dizziness;Decreased balance   Rehab Potential Good   PT Frequency 1x / week   PT Duration 8 weeks   PT Treatment/Interventions Vestibular;Canalith Repostioning;Patient/family education;Neuromuscular re-education;Balance training;Therapeutic exercise;Gait training   PT Next Visit Plan Consider retesting functional outcome measures next visit.    PT Home Exercise Plan VOR x 1 forward/retro ambulation, semi-tandem/tandem progressions with head and head/body turns, seated head to knee/ceiling   Consulted and Agree with Plan of Care Patient        Problem List Patient Active Problem List   Diagnosis Date Noted  . Hyperlipidemia 03/01/2015  . Hematuria 12/02/2014  . 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 Fatigue 11/16/2014  . Fibrocystic breast disease 11/16/2014  . Anemia, iron deficiency 11/16/2014  . Osteopenia 11/16/2014  . Recurrent UTI 11/16/2014  . Closed rib fracture 11/16/2014  . Avitaminosis D 11/16/2014  . Dizziness 11/16/2014   Lady Deutscher PT, DPT Lady Deutscher 03/08/2015, 3:22 PM  [place] MAIN [facility] SERVICES [address] [place], [place], [address] Phone: [phone]   Fax:  [phone]  Name:  Anne Swanson MRN: [id number] Date of Birth: 17-Oct-1960

## 2015-04-13 ENCOUNTER — Telehealth: Payer: Self-pay | Admitting: Family Medicine

## 2015-04-27 NOTE — Telephone Encounter (Signed)
error 

## 2015-06-14 ENCOUNTER — Encounter: Payer: Self-pay | Admitting: Family Medicine

## 2015-06-14 ENCOUNTER — Ambulatory Visit (INDEPENDENT_AMBULATORY_CARE_PROVIDER_SITE_OTHER): Payer: 59 | Admitting: Family Medicine

## 2015-06-14 VITALS — BP 128/76 | HR 80 | Temp 98.2°F | Resp 16 | Ht 63.0 in | Wt 129.0 lb

## 2015-06-14 DIAGNOSIS — Z0000 Encounter for general adult medical examination without abnormal findings: Secondary | ICD-10-CM

## 2015-06-14 DIAGNOSIS — D509 Iron deficiency anemia, unspecified: Secondary | ICD-10-CM | POA: Diagnosis not present

## 2015-06-14 DIAGNOSIS — M8580 Other specified disorders of bone density and structure, unspecified site: Secondary | ICD-10-CM

## 2015-06-14 DIAGNOSIS — E785 Hyperlipidemia, unspecified: Secondary | ICD-10-CM

## 2015-06-14 DIAGNOSIS — E559 Vitamin D deficiency, unspecified: Secondary | ICD-10-CM | POA: Diagnosis not present

## 2015-06-14 NOTE — Progress Notes (Signed)
Patient: Anne Swanson, Female    DOB: 08/09/1960, 55 y.o.   MRN: [id number] Visit Date: 06/14/2015  Today's Provider: Mila Merry, MD   Chief Complaint  Patient presents with  . Annual Exam   Subjective:    Annual physical exam Anne Swanson is a 55 y.o. female who presents today for health maintenance and complete physical. She feels fairly well. She reports exercising daily walking her dog. She reports she is sleeping fairly well.  ----------------------------------------------------------------- Follow up Osteopenia:  Last office visit was 1 year ago. Management during that visit includes ordering BMD which showed Osteopenia of hip slightly worse . Patient was to continue Vitamin D replacement and recheck in 3 years. Patient comes in today reporting that she has not been taking vitamin D supplements.   Follow up Iron Deficiency Anemia:  Last office visit was 6 month ago and no changes were made.    Follow up Vitamin D deficiency:  Last office visit was 6 months ago and bo changes were made. Patient has not been taking any Vitamin D supplements.   Lipid/Cholesterol, Follow-up:   Last seen for this 3 months ago.  Management changes since that visit include none. Patient was to continue taking Aspirin and stay off Lovastatin for the time being. . Last Lipid Panel:    Component Value Date/Time   CHOL 248* 06/11/2014   TRIG 93 06/11/2014   HDL 54 06/11/2014   LDLCALC 175 06/11/2014    Risk factors for vascular disease include hypercholesterolemia  She reports poor compliance with treatment. Has not been watching her diet. She is not having side effects.  Current symptoms include none and have been stable. Weight trend: stable Prior visit with dietician: no Current diet: in general, an "unhealthy" diet Current exercise: walking  Wt Readings from Last 3 Encounters:  03/01/15 123 lb (55.792 kg)  01/25/15 115 lb (52.164 kg)  12/29/14 119 lb (53.978  kg)    -------------------------------------------------------------------  Review of Systems  Constitutional: Negative for fever, chills and fatigue.  HENT: Positive for rhinorrhea and tinnitus. Negative for congestion, ear pain, sneezing and sore throat.   Eyes: Negative.  Negative for pain and redness.  Respiratory: Positive for wheezing. Negative for cough and shortness of breath.   Cardiovascular: Negative for chest pain and leg swelling.  Gastrointestinal: Negative for nausea, abdominal pain, diarrhea, constipation and blood in stool.  Endocrine: Negative for polydipsia and polyphagia.  Genitourinary: Negative.  Negative for dysuria, hematuria, flank pain, vaginal bleeding, vaginal discharge and pelvic pain.  Musculoskeletal: Positive for neck pain. Negative for back pain, joint swelling, arthralgias and gait problem.  Skin: Negative for rash.  Neurological: Positive for tremors and numbness (in face; unchanged for several years). Negative for dizziness, seizures, weakness, light-headedness and headaches.  Hematological: Negative for adenopathy.  Psychiatric/Behavioral: Negative.  Negative for behavioral problems, confusion and dysphoric mood. The patient is not nervous/anxious and is not hyperactive.     Social History      She  reports that she has never smoked. She does not have any smokeless tobacco history on file. She reports that she drinks alcohol. She reports that she does not use illicit drugs.       Social History   Social History  . Marital Status: Married    Spouse Name: N/A  . Number of Children: 3  . Years of Education: N/A   Occupational History  . Self employed    Social History Main Topics  .  Smoking status: Never Smoker   . Smokeless tobacco: None  . Alcohol Use: 0.0 oz/week    0 Standard drinks or equivalent per week     Comment: once a month  . Drug Use: No  . Sexual Activity: Not Asked   Other Topics Concern  . None   Social History Narrative     Past Medical History  Diagnosis Date  . Fibrocystic breast disease   . Osteopenia   . Asthma   . High cholesterol      Patient Active Problem List   Diagnosis Date Noted  . Hyperlipidemia 03/01/2015  . Hematuria 12/02/2014  . 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 Fatigue 11/16/2014  . Fibrocystic breast disease 11/16/2014  . Anemia, iron deficiency 11/16/2014  . Osteopenia 11/16/2014  . Recurrent UTI 11/16/2014  . Closed rib fracture 11/16/2014  . Avitaminosis D 11/16/2014  . Dizziness 11/16/2014    Past Surgical History  Procedure Laterality Date  . Wisdom tooth extraction    . Cesarean section      x3    Family History        Family Status  Relation Status Death Age  . Mother Deceased 34    Breast cancer and colon cancer  . Father Deceased 49    died from MI  . Brother Deceased 62    fdied from Leukemia        Her family history includes Cancer in her maternal uncle; Coronary artery disease in her father; Schizophrenia in her father.    Allergies  Allergen Reactions  . Lodine [Etodolac] Other (See Comments)    Facial numbness    Previous Medications   ALBUTEROL (VENTOLIN HFA) 108 (90 BASE) MCG/ACT INHALER    Inhale into the lungs.   ASPIRIN 81 MG TABLET    Take 81 mg by mouth daily.    Patient Care Team: Malva Limes, MD as PCP - General (Family Medicine)     Objective:   Vitals: BP 128/76 mmHg  Pulse 80  Temp(Src) 98.2 F (36.8 C) (Oral)  Resp 16  Ht  (1.6 m)  Wt 129 lb (58.514 kg)  BMI 22.86 kg/m2  LMP 02/15/2014   Physical Exam  General Appearance:    Alert, cooperative, no distress, appears stated age  Head:    Normocephalic, without obvious abnormality, atraumatic  Eyes:    PERRL, conjunctiva/corneas clear, EOM's intact, fundi    benign, both eyes  Ears:    Normal TM's and external ear canals, both ears  Nose:   Nares normal, septum midline, mucosa normal, no drainage    or sinus tenderness  Throat:   Lips, mucosa,  and tongue normal; teeth and gums normal  Neck:   Supple, symmetrical, trachea midline, no adenopathy;    thyroid:  no enlargement/tenderness/nodules; no carotid   bruit or JVD  Back:     Symmetric, no curvature, ROM normal, no CVA tenderness  Lungs:     Clear to auscultation bilaterally, respirations unlabored  Chest Wall:    No tenderness or deformity   Heart:    Regular rate and rhythm, S1 and S2 normal, no murmur, rub   or gallop  Breast Exam:    normal appearance, no masses or tenderness, deferred  Abdomen:     Soft, non-tender, bowel sounds active all four quadrants,    no masses, no organomegaly  Pelvic:    deferred  Extremities:   Extremities normal, atraumatic, no cyanosis or edema  Pulses:  2+ and symmetric all extremities  Skin:   Skin color, texture, turgor normal, no rashes or lesions  Lymph nodes:   Cervical, supraclavicular, and axillary nodes normal  Neurologic:   CNII-XII intact, normal strength, sensation and reflexes    throughout    Depression Screen PHQ 2/9 Scores 06/14/2015  PHQ - 2 Score 0  PHQ- 9 Score 0      Assessment & Plan:     Routine Health Maintenance and Physical Exam  Exercise Activities and Dietary recommendations Goals    None      Immunization History  Administered Date(s) Administered  . Tdap 03/19/2007    Health Maintenance  Topic Date Due  . Hepatitis C Screening  01/19/1961  . HIV Screening  09/01/1975  . PAP SMEAR  05/18/2014  . MAMMOGRAM  01/15/2015  . INFLUENZA VACCINE  07/16/2015 (Originally 11/16/2014)  . TETANUS/TDAP  03/18/2017  . COLONOSCOPY  11/06/2022      Discussed health benefits of physical activity, and encouraged her to engage in regular exercise appropriate for her age and condition.    --------------------------------------------------------------------  1. Annual physical exam   2. Anemia, iron deficiency  - Ferritin  3. Osteopenia Check BMD next year.    4. Hyperlipidemia Currently off  statin - Lipid panel  5. Vitamin D deficiency Currently off of vitamin D supplement. - VITAMIN D 25 Hydroxy (Vit-D Deficiency, Fractures)

## 2015-06-16 LAB — FERRITIN: Ferritin: 59 ng/mL (ref 15–150)

## 2015-06-16 LAB — LIPID PANEL
CHOL/HDL RATIO: 4.4 ratio (ref 0.0–4.4)
Cholesterol, Total: 270 mg/dL — ABNORMAL HIGH (ref 100–199)
HDL: 62 mg/dL (ref >39)
LDL CALC: 188 mg/dL — AB (ref 0–99)
TRIGLYCERIDES: 98 mg/dL (ref 0–149)
VLDL CHOLESTEROL CAL: 20 mg/dL (ref 5–40)

## 2015-06-16 LAB — VITAMIN D 25 HYDROXY (VIT D DEFICIENCY, FRACTURES): Vit D, 25-Hydroxy: 21.9 ng/mL — ABNORMAL LOW (ref 30.0–100.0)

## 2015-08-02 ENCOUNTER — Other Ambulatory Visit: Payer: Self-pay | Admitting: Family Medicine

## 2015-08-05 ENCOUNTER — Telehealth: Payer: Self-pay | Admitting: Family Medicine

## 2015-08-05 NOTE — Telephone Encounter (Signed)
Called patient to get more information. Patient states she needs an Armed forces technical officerAero Chamber  For her inhaler. She states Sharen Hintebbie Brady prescribed this for her a long time ago. She states the aero chamber fits over the mouthpiece of her ventolin inhaler allowing better delivery of the medication.

## 2015-08-05 NOTE — Telephone Encounter (Signed)
[facility] [address] [facility] in for aero chamber for her inhaler.Phone # listed in chart is correct

## 2015-08-05 NOTE — Telephone Encounter (Signed)
OK to call in aero-chamber to use as directed with inhaler., #1, no refills.

## 2015-12-17 ENCOUNTER — Encounter: Payer: Self-pay | Admitting: Family Medicine

## 2015-12-17 ENCOUNTER — Ambulatory Visit (INDEPENDENT_AMBULATORY_CARE_PROVIDER_SITE_OTHER): Payer: 59 | Admitting: Family Medicine

## 2015-12-17 VITALS — BP 118/70 | HR 70 | Temp 98.0°F | Resp 16 | Wt 130.0 lb

## 2015-12-17 DIAGNOSIS — E785 Hyperlipidemia, unspecified: Secondary | ICD-10-CM

## 2015-12-17 DIAGNOSIS — R42 Dizziness and giddiness: Secondary | ICD-10-CM

## 2015-12-17 DIAGNOSIS — E559 Vitamin D deficiency, unspecified: Secondary | ICD-10-CM

## 2015-12-17 NOTE — Progress Notes (Signed)
Patient: Anne Swanson Female    DOB: 1960/06/12   55 y.o.   MRN: [id number] Visit Date: 12/17/2015  Today's Provider: Mila Merry, MD   Chief Complaint  Patient presents with  . Follow-up    Vitamin D defiecency  . Hyperlipidemia   Subjective:    HPI Follow up Vitamin D Defiecency: Patient waws last seen 6 months ago for this problem. Changes made during that visit includes starting Vitamin D3 2,000units daily.  Patient reports good compliance with treatment and good tolerance.  Lab Results  Component Value Date   VD25OH 21.9 (L) 06/15/2015      Lipid/Cholesterol, Follow-up:   Last seen for this 6 months ago.  Management changes since that visit include  Advising patient to cut back on saturated fats in her diet and take a daily fiber supplement like Metamucil. . Last Lipid Panel:    Component Value Date/Time   CHOL 270 (H) 06/15/2015 0945   TRIG 98 06/15/2015 0945   HDL 62 06/15/2015 0945   CHOLHDL 4.4 06/15/2015 0945   LDLCALC 188 (H) 06/15/2015 0945    Risk factors for vascular disease include hypercholesterolemia  She reports fair compliance with treatment. Patient has ben watching her diet but did not start taking daily fiber supplements. Patient has increased her exercise. Patient states she had her labs rechecked with [facility] about 1 month ago and her cholesterol level had come down to 206.  She is not having side effects.  Current symptoms include none and have been stable. Weight trend: stable Prior visit with dietician: no Current diet: in general, a "healthy" diet   Current exercise: cardiovascular workout on exercise equipment and walking  Wt Readings from Last 3 Encounters:  06/14/15 129 lb (58.5 kg)  03/01/15 123 lb (55.8 kg)  01/25/15 115 lb (52.2 kg)    ------------------------------------------------------------------- Dizziness She had prolonged episode of vestibular dysfunction last year followed by Anne Swanson. She responded well  to vestibular rehab, but reports she is starting to feel symptoms returning. She is unable to do exercises on her own and needs to get back in with physical therapist.     Allergies  Allergen Reactions  . Lodine [Etodolac] Other (See Comments)    Facial numbness   Current Meds  Medication Sig  . aspirin 81 MG tablet Take 81 mg by mouth daily.  . Cholecalciferol (VITAMIN D3) 2000 units TABS Take 1 tablet by mouth daily.  . VENTOLIN HFA 108 (90 Base) MCG/ACT inhaler USE 2 PUFFS EVERY 4 TO 6 HOURS AS NEEDED    Review of Systems  Constitutional: Negative for appetite change, chills, fatigue and fever.  Respiratory: Negative for chest tightness and shortness of breath.   Cardiovascular: Negative for chest pain and palpitations.  Gastrointestinal: Negative for abdominal pain, nausea and vomiting.  Endocrine: Negative for cold intolerance, heat intolerance, polydipsia, polyphagia and polyuria.  Neurological: Positive for light-headedness. Negative for dizziness and weakness.    Social History  Substance Use Topics  . Smoking status: Never Smoker  . Smokeless tobacco: Never Used  . Alcohol use 0.0 oz/week     Comment: once a month   Objective:   BP 118/70 (BP Location: Left Arm, Patient Position: Sitting, Cuff Size: Normal)   Pulse 70   Temp 98 F (36.7 C) (Oral)   Resp 16   Wt 130 lb (59 kg)   LMP 02/15/2014   SpO2 97% Comment: room air  BMI 23.03 kg/m  Physical Exam  General appearance: alert, well developed, well nourished, cooperative and in no distress Head: Normocephalic, without obvious abnormality, atraumatic Respiratory: Respirations even and unlabored, normal respiratory rate Extremities: No gross deformities Skin: Skin color, texture, turgor normal. No rashes seen  Psych: Appropriate mood and affect. Neurologic: Mental status: Alert, oriented to person, place, and time, thought content appropriate.     Assessment & Plan:     1. Vitamin D deficiency Doing  well with OTC vitamin D supplement. Has history of osteopenia - VITAMIN D 25 Hydroxy (Vit-D Deficiency, Fractures)  2. Hyperlipidemia Doing well with diet. She is going to bring a copy of her labs from [facility]  3. Dizziness Had had thorough evaluation and responded well to vestibular rehab ordered by Dr. Jenne CampusMcQueen last year.        Mila Merryonald Trystin Terhune, MD  [facility]

## 2015-12-18 LAB — VITAMIN D 25 HYDROXY (VIT D DEFICIENCY, FRACTURES): VIT D 25 HYDROXY: 33.2 ng/mL (ref 30.0–100.0)

## 2016-01-11 ENCOUNTER — Ambulatory Visit: Payer: 59 | Attending: Family Medicine | Admitting: Physical Therapy

## 2016-01-11 DIAGNOSIS — M542 Cervicalgia: Secondary | ICD-10-CM | POA: Insufficient documentation

## 2016-01-11 DIAGNOSIS — R42 Dizziness and giddiness: Secondary | ICD-10-CM | POA: Insufficient documentation

## 2016-01-11 DIAGNOSIS — M436 Torticollis: Secondary | ICD-10-CM | POA: Diagnosis present

## 2016-01-11 NOTE — Therapy (Signed)
[facility] [address], [place], [address] Phone: [phone]   Fax:  [phone]  Physical Therapy Treatment  Patient Details  Name: Anne Swanson MRN: [id number] Date of Birth: 1961/02/25 Referring Provider: Lelon Swanson  Encounter Date: 01/11/2016      PT End of Session - 01/11/16 1158    Visit Number 1   Number of Visits 13   Date for PT Re-Evaluation 02/22/16   PT Start Time 1022   PT Stop Time 1110   PT Time Calculation (min) 48 min      Past Medical History:  Diagnosis Date  . Asthma   . Fibrocystic breast disease   . High cholesterol   . Osteopenia     Past Surgical History:  Procedure Laterality Date  . CESAREAN SECTION     x3  . WISDOM TOOTH EXTRACTION      There were no vitals filed for this visit.      Subjective Assessment - 01/11/16 1028    Subjective "A couple of months ago I started getting nauseous with rapid movements of my head". She reports diplopia and very poor balance. 2 months ago she started having "night balance" problems. Pt also reports mild neck pain which is constant. Pt has had no falls because she is good at monitoring her symptoms. She reports she cannot look down when in a car, cannot turn head quickly. Feeling is "seasickness" feeling.    Diagnostic tests MRI for cranium   Patient Stated Goals decr. dizziness, decr. neck pain   Currently in Pain? Yes   Pain Score 2    Pain Location Neck   Aggravating Factors  turning head side to side, any quick movement   Pain Relieving Factors morning            [facility] PT Assessment - 01/11/16 0001      Assessment   Medical Diagnosis Dizziness   Referring Provider Anne Swanson   Prior Therapy 6 months ago     Precautions   Precautions None     Restrictions   Weight Bearing Restrictions No     Balance Screen   Has the patient fallen in the past 6 months No   Has the patient had a decrease in  activity level because of a fear of falling?  No   Is the patient reluctant to leave their home because of a fear of falling?  No     Home Ecologist residence     Prior Function   Level of Independence Independent   Vocation Full time employment   Psychologist, forensic, sitting   Leisure reading/sedentary/walking     Sensation   Additional Comments Deferred however pt reports intact light touch, was impaired last year     Coordination   Gross Motor Movements are Fluid and Coordinated No   Coordination and Movement Description cervical rotation and lateral flexion are limited and ratcheting     Posture/Postural Control   Posture Comments FHP     ROM / Strength   AROM / PROM / Strength AROM     AROM   Overall AROM Comments all movement testing: weak DNF, pain and dizziness with L and R rotation, L and R lateral flexion, highly limited with both. deferred VBI testing due to poor efficacy of test and apparent musculoskeletal component to limited motion.  Shoulder AROM is WNL. Noted significant limitations with upper cervical rotation  and flexion.     Palpation   Palpation comment multiple suboccipital trigger points, reproduction of cervical spine pain with CPAs and L UPAs T1-C6.     Ambulation/Gait   Gait Comments decr. arm swing     High Level Balance   High Level Balance Comments romberg 10 sec. and LOB limited. only able to maintain  for 5 sec with head turns. incr. dizziness with gait with head turn and flexion.                Objective: Wt shifting side to side, rotational component of head turns. Pt required extensive cuing to perform this to minimize compensation and improve control.  Pt issued this as HEP. 3x2 min repetition.             PT Education - 01/11/16 1155    Education provided Yes   Education Details progression of HEP   Person(s) Educated Patient   Methods Explanation   Comprehension  Verbalized understanding             PT Long Term Goals - 01/11/16 1207      PT LONG TERM GOAL #1   Title Pt will have decr. fall risk as seen by improvement in tandem stance to 30 sec with head turns   Baseline 10 sec.   Time 6   Status New     PT LONG TERM GOAL #2   Title Patient will be able to perform home program independently for self-management.   Time 6   Period Weeks   Status New     PT LONG TERM GOAL #3   Title Pt will have improved upper cervical rotation to 45 degr.    Baseline 10 deg.    Time 6   Period Weeks   Status New               Plan - 01/11/16 1201    Clinical Impression Statement Pt is a pleasant 55 y/o female with c/o dizziness and chronic neck pain. Dizziness improved somewhat with previous therapy but may have element of cervicogenic dizziness. Noted significant cervical dysfunction with upper cervical spine including suboccipital muscles and with upper cervical ROM. Also noted significant ROM associated dizziness. Pt would benefit from skilled PT to address these issues.   Rehab Potential Good   Clinical Impairments Affecting Rehab Potential chronic pain and chronic dizziness/sedentary lifestyle/motivation   PT Frequency 2x / week   PT Duration 6 weeks   PT Treatment/Interventions Therapeutic exercise;Dry needling;Balance training;ADLs/Self Care Home Management;Aquatic Therapy;Manual techniques;Therapeutic activities;Neuromuscular re-education   PT Home Exercise Plan tai chi related exercise      Patient will benefit from skilled therapeutic intervention in order to improve the following deficits and impairments:  Pain, Decreased balance, Decreased strength, Decreased range of motion, Difficulty walking, Postural dysfunction, Dizziness  Visit Diagnosis: Dizziness and giddiness  Cervicalgia  Stiffness of cervical spine     Problem List Patient Active Problem List   Diagnosis Date Noted  . Hyperlipidemia 03/01/2015  . Hematuria  12/02/2014  . 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 Fatigue 11/16/2014  . Fibrocystic breast disease 11/16/2014  . Anemia, iron deficiency 11/16/2014  . Osteopenia 11/16/2014  . Recurrent UTI 11/16/2014  . Closed rib fracture 11/16/2014  . Vitamin D deficiency 11/16/2014  . Dizziness 11/16/2014  . Closed fracture of one rib 11/16/2014    Anne Swanson 01/11/2016, 12:15 PM  [facility] [address], [place],  [place] Phone: [phone]   Fax:  [phone]  Name: Anne Swanson MRN: [id number] Date of Birth: Aug 26, 1960

## 2016-01-13 ENCOUNTER — Ambulatory Visit: Payer: 59 | Admitting: Physical Therapy

## 2016-01-14 ENCOUNTER — Ambulatory Visit: Payer: 59 | Admitting: Physical Therapy

## 2016-01-14 DIAGNOSIS — M542 Cervicalgia: Secondary | ICD-10-CM

## 2016-01-14 DIAGNOSIS — M436 Torticollis: Secondary | ICD-10-CM

## 2016-01-14 DIAGNOSIS — R42 Dizziness and giddiness: Secondary | ICD-10-CM | POA: Diagnosis not present

## 2016-01-14 NOTE — Therapy (Signed)
[facility] [address], [place], [address] Phone: [phone]   Fax:  [phone]  Physical Therapy Treatment  Patient Details  Name: Anne Swanson MRN: [id number] Date of Birth: 09-20-1960 Referring Provider: Lelon Huh  Encounter Date: 01/14/2016      PT End of Session - 01/14/16 1109    Visit Number 2   Number of Visits 13   Date for PT Re-Evaluation 02/22/16   PT Start Time 1030   PT Stop Time 1108   PT Time Calculation (min) 38 min      Past Medical History:  Diagnosis Date  . Asthma   . Fibrocystic breast disease   . High cholesterol   . Osteopenia     Past Surgical History:  Procedure Laterality Date  . CESAREAN SECTION     x3  . WISDOM TOOTH EXTRACTION      There were no vitals filed for this visit.      Subjective Assessment - 01/14/16 1034    Subjective Pt reports incr. soreness in her cervical spine.   Diagnostic tests MRI for cranium   Patient Stated Goals decr. dizziness, decr. neck pain   Currently in Pain? Yes   Pain Score 4    Pain Location Neck   Pain Orientation Right             Objective: CPAs grade IV 3x1 min T1-T5  Followign this performed suboccipital release x5 min total.  R UPAs grade II 3x1 min T1-T2.  Trigger point dry needling performed following gaining informed consent (verbal approval) on suboccipitals, B UT.  Following this pt reported incr. Sore feeling, but demonstrated improvement in ROM.  Pt also reported feeling some radiating symptoms when dry needled in R suboccipitals.  Following this issued and hda pt perform routine focusing on ROM and relaxation: Arm swings forward, sideways, rotational 2x1 min each.  Cervical AROM in pain free range lateral flexion, rotation, 2x10 of each of these.  Following session pt reported "I feel better than I have in two weeks". Pt required cuing with exercises to minimize excess UT  activation.                    PT Education - 01/14/16 1035    Education provided Yes   Education Details HEP   Person(s) Educated Patient   Methods Explanation   Comprehension Verbalized understanding             PT Long Term Goals - 01/11/16 1207      PT LONG TERM GOAL #1   Title Pt will have decr. fall risk as seen by improvement in tandem stance to 30 sec with head turns   Baseline 10 sec.   Time 6   Status New     PT LONG TERM GOAL #2   Title Patient will be able to perform home program independently for self-management.   Time 6   Period Weeks   Status New     PT LONG TERM GOAL #3   Title Pt will have improved upper cervical rotation to 45 degr.    Baseline 10 deg.    Time 6   Period Weeks   Status New               Plan - 01/14/16 1112    Clinical Impression Statement Pt responded well to session, has notable incr. trigger points in suboccipitals, B UT. Utilized manual intervention  as well as trigger point dry needlin to address this, followed by stretching/relaxation exercises to address continued c/o muscle tightnes which may be contributin to her pain.   Rehab Potential Good   Clinical Impairments Affecting Rehab Potential chronic pain and chronic dizziness/sedentary lifestyle/motivation   PT Frequency 2x / week   PT Duration 6 weeks   PT Treatment/Interventions Therapeutic exercise;Dry needling;Balance training;ADLs/Self Care Home Management;Aquatic Therapy;Manual techniques;Therapeutic activities;Neuromuscular re-education   PT Home Exercise Plan tai chi related exercise      Patient will benefit from skilled therapeutic intervention in order to improve the following deficits and impairments:  Pain, Decreased balance, Decreased strength, Decreased range of motion, Difficulty walking, Postural dysfunction, Dizziness  Visit Diagnosis: Cervicalgia  Stiffness of cervical spine     Problem List Patient Active Problem List    Diagnosis Date Noted  . Hyperlipidemia 03/01/2015  . Hematuria 12/02/2014  . 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 Fatigue 11/16/2014  . Fibrocystic breast disease 11/16/2014  . Anemia, iron deficiency 11/16/2014  . Osteopenia 11/16/2014  . Recurrent UTI 11/16/2014  . Closed rib fracture 11/16/2014  . Vitamin D deficiency 11/16/2014  . Dizziness 11/16/2014  . Closed fracture of one rib 11/16/2014    Fisher,Benjamin 01/14/2016, 11:14 AM  [facility] [address], [place], [address] Phone: [phone]   Fax:  [phone]  Name: ADDYSYN FERN MRN: [id number] Date of Birth: [birth year]

## 2016-01-17 ENCOUNTER — Ambulatory Visit: Payer: 59 | Attending: Family Medicine | Admitting: Physical Therapy

## 2016-01-17 DIAGNOSIS — M436 Torticollis: Secondary | ICD-10-CM | POA: Diagnosis present

## 2016-01-17 DIAGNOSIS — M542 Cervicalgia: Secondary | ICD-10-CM | POA: Insufficient documentation

## 2016-01-18 NOTE — Therapy (Signed)
[facility] [address] [place], [address] Phone: [phone]   Fax:  [phone]  Physical Therapy Treatment  Patient Details  Name: Anne Swanson MRN: [id number] Date of Birth: [birth year] Referring Provider: Lelon Huh  Encounter Date: 01/17/2016      PT End of Session - 01/17/16 0721    Visit Number 3   Number of Visits 13   Date for PT Re-Evaluation 02/22/16   PT Start Time 1130   PT Stop Time 1200   PT Time Calculation (min) 30 min      Past Medical History:  Diagnosis Date  . Asthma   . Fibrocystic breast disease   . High cholesterol   . Osteopenia     Past Surgical History:  Procedure Laterality Date  . CESAREAN SECTION     x3  . WISDOM TOOTH EXTRACTION      There were no vitals filed for this visit.      Subjective Assessment - 01/17/16 1131    Subjective Pt reports decr. soreness, decr., decr. nausea and general improvement in symptoms, still notes significant difficulty with head turns in a down position.   Diagnostic tests MRI for cranium   Patient Stated Goals decr. dizziness, decr. neck pain   Currently in Pain? No/denies                  Objective: Extensive CPAs grade IV 3x1 min T1-T5, following this had pt sit up and amb with arm swings to improve thoracic mobility following manual intervention.  Performed suboccipital dry needling (no charge) which was painful and referred pain into cranial region. Following this pt reported decr. "fuzzy headedness" with head turns.  PT educated pt on incr. Activity at next session.                    PT Long Term Goals - 01/11/16 1207      PT LONG TERM GOAL #1   Title Pt will have decr. fall risk as seen by improvement in tandem stance to 30 sec with head turns   Baseline 10 sec.   Time 6   Status New     PT LONG TERM GOAL #2   Title Patient will be able to perform home program independently  for self-management.   Time 6   Period Weeks   Status New     PT LONG TERM GOAL #3   Title Pt will have improved upper cervical rotation to 45 degr.    Baseline 10 deg.    Time 6   Period Weeks   Status New               Plan - 01/17/16 9833    Clinical Impression Statement Pt appears to be making improvements in both cervical spine pain as well as experience of dizziness symptoms. Unable to progress HEP due to short session but will look to do so at next session. Pt continues to have tightness in suboccipitals as well as weakness in DNF.   Rehab Potential Good   Clinical Impairments Affecting Rehab Potential chronic pain and chronic dizziness/sedentary lifestyle/motivation   PT Frequency 2x / week   PT Duration 6 weeks   PT Treatment/Interventions Therapeutic exercise;Dry needling;Balance training;ADLs/Self Care Home Management;Aquatic Therapy;Manual techniques;Therapeutic activities;Neuromuscular re-education   PT Home Exercise Plan tai chi related exercise      Patient will benefit from skilled therapeutic intervention in order to improve the  following deficits and impairments:  Pain, Decreased balance, Decreased strength, Decreased range of motion, Difficulty walking, Postural dysfunction, Dizziness  Visit Diagnosis: Cervicalgia  Stiffness of cervical spine     Problem List Patient Active Problem List   Diagnosis Date Noted  . Hyperlipidemia 03/01/2015  . Hematuria 12/02/2014  . 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 Fatigue 11/16/2014  . Fibrocystic breast disease 11/16/2014  . Anemia, iron deficiency 11/16/2014  . Osteopenia 11/16/2014  . Recurrent UTI 11/16/2014  . Closed rib fracture 11/16/2014  . Vitamin D deficiency 11/16/2014  . Dizziness 11/16/2014  . Closed fracture of one rib 11/16/2014    Fisher,Benjamin 01/18/2016, 9:02 AM  [facility] [address], [place],  [address] Phone: [phone]   Fax:  [phone]  Name: Anne Swanson MRN: [id number] Date of Birth: 11-14-1960

## 2016-01-21 ENCOUNTER — Ambulatory Visit: Payer: 59 | Admitting: Physical Therapy

## 2016-01-24 ENCOUNTER — Ambulatory Visit: Payer: 59 | Admitting: Physical Therapy

## 2016-01-24 DIAGNOSIS — M436 Torticollis: Secondary | ICD-10-CM

## 2016-01-24 DIAGNOSIS — M542 Cervicalgia: Secondary | ICD-10-CM | POA: Diagnosis not present

## 2016-01-24 NOTE — Therapy (Signed)
[facility] [address], [place], [address] Phone: [phone]   Fax:  [phone]  Physical Therapy Treatment  Patient Details  Name: Anne Swanson MRN: [id number] Date of Birth: 07-23-1960 Referring Provider: Lelon Huh  Encounter Date: 01/24/2016      PT End of Session - 01/24/16 0710    Visit Number 4   Number of Visits 13   Date for PT Re-Evaluation 02/22/16   PT Start Time 0700   PT Stop Time 0730   PT Time Calculation (min) 30 min      Past Medical History:  Diagnosis Date  . Asthma   . Fibrocystic breast disease   . High cholesterol   . Osteopenia     Past Surgical History:  Procedure Laterality Date  . CESAREAN SECTION     x3  . WISDOM TOOTH EXTRACTION      There were no vitals filed for this visit.      Subjective Assessment - 01/24/16 0706    Subjective Pt reports incr. neck pain which she relates to incr. stress over the past few days.    Diagnostic tests MRI for cranium   Patient Stated Goals decr. dizziness, decr. neck pain   Currently in Pain? Yes   Pain Score 7    Pain Location Neck   Pain Orientation Right               Objective: Focus of session on exercise to address continued deficits.  Supine chin tucks, cuing for stretching suboccipitals, x4 min total.  Same performed with cuing to relax and "drop" chest.  Pt reported these exercises were consistentlychallenging, some incr. In nausea with performance but overall improved symptoms following exercise.  Forward lean head turns 3x10, pt had difficulty with this.   RTB scapular retractions 3x15. Required cuing for chin tuck and scapular retraction.  Pt had difficulty with all exercises. Required cuing for performance, for muscle activation.                  PT Education - 01/24/16 0709    Education provided Yes   Education Details HEP   Person(s) Educated Patient   Methods  Explanation   Comprehension Verbalized understanding             PT Long Term Goals - 01/11/16 1207      PT LONG TERM GOAL #1   Title Pt will have decr. fall risk as seen by improvement in tandem stance to 30 sec with head turns   Baseline 10 sec.   Time 6   Status New     PT LONG TERM GOAL #2   Title Patient will be able to perform home program independently for self-management.   Time 6   Period Weeks   Status New     PT LONG TERM GOAL #3   Title Pt will have improved upper cervical rotation to 45 degr.    Baseline 10 deg.    Time 6   Period Weeks   Status New               Plan - 01/24/16 0802    Clinical Impression Statement Pt continues to improve in terms of frequency of nausea. Neck pain has flared up as is expected for incr. focus on neck symptoms. Focus of session on progressing HEP which pt was able to perform well.   Rehab Potential Good   Clinical Impairments Affecting  Rehab Potential chronic pain and chronic dizziness/sedentary lifestyle/motivation   PT Frequency 2x / week   PT Duration 6 weeks   PT Treatment/Interventions Therapeutic exercise;Dry needling;Balance training;ADLs/Self Care Home Management;Aquatic Therapy;Manual techniques;Therapeutic activities;Neuromuscular re-education   PT Home Exercise Plan tai chi related exercise      Patient will benefit from skilled therapeutic intervention in order to improve the following deficits and impairments:  Pain, Decreased balance, Decreased strength, Decreased range of motion, Difficulty walking, Postural dysfunction, Dizziness  Visit Diagnosis: Cervicalgia  Stiffness of cervical spine     Problem List Patient Active Problem List   Diagnosis Date Noted  . Hyperlipidemia 03/01/2015  . Hematuria 12/02/2014  . 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 Fatigue 11/16/2014  . Fibrocystic breast disease 11/16/2014  . Anemia, iron deficiency 11/16/2014  . Osteopenia 11/16/2014  . Recurrent UTI  11/16/2014  . Closed rib fracture 11/16/2014  . Vitamin D deficiency 11/16/2014  . Dizziness 11/16/2014  . Closed fracture of one rib 11/16/2014    Aarik Blank 01/24/2016, 10:08 AM  [facility] [address] [place], [address] Phone: [phone]   Fax:  [phone]  Name: Anne Swanson MRN: [id number] Date of Birth: January 22, 1961

## 2016-01-27 ENCOUNTER — Ambulatory Visit: Payer: 59 | Admitting: Physical Therapy

## 2016-01-27 DIAGNOSIS — M542 Cervicalgia: Secondary | ICD-10-CM | POA: Diagnosis not present

## 2016-01-27 DIAGNOSIS — M436 Torticollis: Secondary | ICD-10-CM

## 2016-01-27 NOTE — Therapy (Signed)
[facility] [address] [place], [address] Phone: [phone]   Fax:  [phone]  Physical Therapy Treatment  Patient Details  Name: Anne Swanson MRN: [id number] Date of Birth: 1961-04-16 Referring Provider: Lelon Huh  Encounter Date: 01/27/2016      PT End of Session - 01/27/16 0739    Visit Number 5   Number of Visits 13   Date for PT Re-Evaluation 02/22/16   PT Start Time 0730   PT Stop Time 0800   PT Time Calculation (min) 30 min      Past Medical History:  Diagnosis Date  . Asthma   . Fibrocystic breast disease   . High cholesterol   . Osteopenia     Past Surgical History:  Procedure Laterality Date  . CESAREAN SECTION     x3  . WISDOM TOOTH EXTRACTION      There were no vitals filed for this visit.      Subjective Assessment - 01/27/16 0737    Subjective Pt reports incr. symptoms the day after PT which has improved. Reports decr. nausea. She does still have double vision.    Diagnostic tests MRI for cranium   Patient Stated Goals decr. dizziness, decr. neck pain   Currently in Pain? No/denies                   Objective: Trigger point dry needling (no charge), CPAs, tennis ball flexion exercise                   PT Long Term Goals - 01/11/16 1207      PT LONG TERM GOAL #1   Title Pt will have decr. fall risk as seen by improvement in tandem stance to 30 sec with head turns   Baseline 10 sec.   Time 6   Status New     PT LONG TERM GOAL #2   Title Patient will be able to perform home program independently for self-management.   Time 6   Period Weeks   Status New     PT LONG TERM GOAL #3   Title Pt will have improved upper cervical rotation to 45 degr.    Baseline 10 deg.    Time 6   Period Weeks   Status New               Plan - 01/27/16 1152    Clinical Impression Statement Pt appears to be continuing to make  progress. would benefit from assessment of current vestibular situation at next session.   Rehab Potential Good   Clinical Impairments Affecting Rehab Potential chronic pain and chronic dizziness/sedentary lifestyle/motivation   PT Frequency 2x / week   PT Duration 6 weeks   PT Treatment/Interventions Therapeutic exercise;Dry needling;Balance training;ADLs/Self Care Home Management;Aquatic Therapy;Manual techniques;Therapeutic activities;Neuromuscular re-education   PT Home Exercise Plan tai chi related exercise      Patient will benefit from skilled therapeutic intervention in order to improve the following deficits and impairments:  Pain, Decreased balance, Decreased strength, Decreased range of motion, Difficulty walking, Postural dysfunction, Dizziness  Visit Diagnosis: Cervicalgia  Stiffness of cervical spine     Problem List Patient Active Problem List   Diagnosis Date Noted  . Hyperlipidemia 03/01/2015  . Hematuria 12/02/2014  . 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 Fatigue 11/16/2014  . Fibrocystic breast disease 11/16/2014  . Anemia, iron deficiency 11/16/2014  . Osteopenia 11/16/2014  . Recurrent UTI 11/16/2014  .  Closed rib fracture 11/16/2014  . Vitamin D deficiency 11/16/2014  . Dizziness 11/16/2014  . Closed fracture of one rib 11/16/2014    Fisher,Benjamin PT DPT 01/27/2016, 11:55 AM  [facility] [address] [place], [address] Phone: [phone]   Fax:  [phone]  Name: Anne Swanson MRN: [id number] Date of Birth: 16-Dec-1960

## 2016-02-01 ENCOUNTER — Ambulatory Visit: Payer: 59 | Admitting: Physical Therapy

## 2016-02-04 ENCOUNTER — Ambulatory Visit: Payer: 59 | Admitting: Physical Therapy

## 2016-02-04 DIAGNOSIS — M542 Cervicalgia: Secondary | ICD-10-CM | POA: Diagnosis not present

## 2016-02-04 NOTE — Therapy (Signed)
[facility] [address] [place], [address] Phone: [phone]   Fax:  [phone]  Physical Therapy Treatment/Discharge  Patient Details  Name: Anne Swanson MRN: [id number] Date of Birth: June 29, 1960 Referring Provider: Lelon Huh  Encounter Date: 02/04/2016      PT End of Session - 02/04/16 1027    Visit Number 6   Number of Visits 13   Date for PT Re-Evaluation 02/22/16   PT Start Time 6384   PT Stop Time 1035   PT Time Calculation (min) 20 min      Past Medical History:  Diagnosis Date  . Asthma   . Fibrocystic breast disease   . High cholesterol   . Osteopenia     Past Surgical History:  Procedure Laterality Date  . CESAREAN SECTION     x3  . WISDOM TOOTH EXTRACTION      There were no vitals filed for this visit.      Subjective Assessment - 02/04/16 1020    Subjective Decr. nausea, decr. dizziness/double vision. Pt requests to end PT for now as she has an upcoming wedding and is busy at work.   Diagnostic tests MRI for cranium   Patient Stated Goals decr. dizziness, decr. neck pain   Currently in Pain? No/denies               Objective: Reviewed HEP. Answered pt questions regarding balance deficits and how to avoid flaring up exercises.  Reinforced disassociation exercises, corrected pt performance of scapular retraction by correcting cervical posture.  Pt verbalized undertsanding of exercises. PT also encouraged pt to allow herself to focus on home stressors rather than focus on exercise until she is able to return for additional PT.                  PT Education - 02/04/16 1026    Education provided Yes   Education Details d/c instructions   Person(s) Educated Patient   Methods Explanation   Comprehension Verbalized understanding             PT Long Term Goals - 02/04/16 1039      PT LONG TERM GOAL #1   Title Pt will have decr.  fall risk as seen by improvement in tandem stance to 30 sec with head turns   Baseline 20   Time 6   Period Weeks   Status Partially Met     PT LONG TERM GOAL #2   Title Patient will be able to perform home program independently for self-management.   Time 6   Period Weeks   Status Achieved     PT LONG TERM GOAL #3   Title Pt will have improved upper cervical rotation to 45 degr.    Baseline 30 deg   Time 6   Period Weeks   Status Not Met               Plan - 02/04/16 1028    Clinical Impression Statement At this time pt will be d/c'd due to needing to focus on home activities. She understands that she will need to return for additional PT in the future to continue to address cervicogenic dizziness.   Rehab Potential Good   Clinical Impairments Affecting Rehab Potential chronic pain and chronic dizziness/sedentary lifestyle/motivation   PT Frequency 2x / week   PT Duration 6 weeks   PT Treatment/Interventions Therapeutic exercise;Dry needling;Balance training;ADLs/Self Care Home Management;Aquatic Therapy;Manual techniques;Therapeutic  activities;Neuromuscular re-education   PT Home Exercise Plan tai chi related exercise      Patient will benefit from skilled therapeutic intervention in order to improve the following deficits and impairments:  Pain, Decreased balance, Decreased strength, Decreased range of motion, Difficulty walking, Postural dysfunction, Dizziness  Visit Diagnosis: Cervicalgia     Problem List Patient Active Problem List   Diagnosis Date Noted  . Hyperlipidemia 03/01/2015  . Hematuria 12/02/2014  . 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 Fatigue 11/16/2014  . Fibrocystic breast disease 11/16/2014  . Anemia, iron deficiency 11/16/2014  . Osteopenia 11/16/2014  . Recurrent UTI 11/16/2014  . Closed rib fracture 11/16/2014  . Vitamin D deficiency 11/16/2014  . Dizziness 11/16/2014  . Closed fracture of one rib 11/16/2014    Fisher,Benjamin PT  DPT 02/04/2016, 10:42 AM  [facility] [address], [place], [address] Phone: [phone]   Fax:  [phone]  Name: DENECE SHEARER MRN: [id number] Date of Birth: 1960/11/06

## 2016-02-05 IMAGING — MR MR HEAD W/O CM
10 series · 44 of 48 positions shown · non-contrast
Comparison: CT head 11/20/2013

CLINICAL DATA: Headache and dizziness

EXAM:
MRI HEAD WITHOUT CONTRAST
TECHNIQUE: Multiplanar, multiecho pulse sequences of the brain and surrounding
structures were obtained without intravenous contrast.

[Series 4: DWI · axial · 4.0mm · 0.94mm/px · z∈[-44,+120]mm · 5 of 41 slices shown (1 of 4)]
[im 1/41]
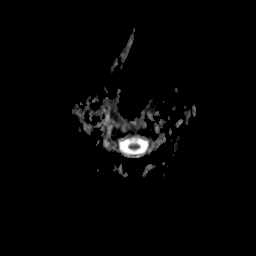
[im 11/41]
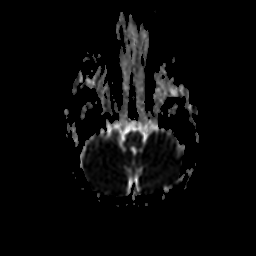
[im 21/41]
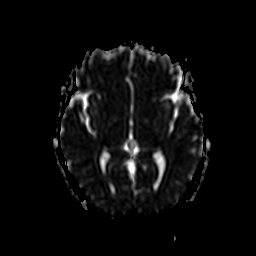
[im 31/41]
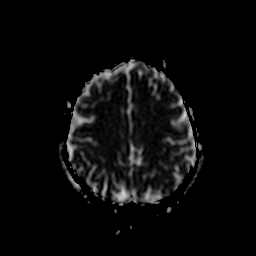
[im 41/41]
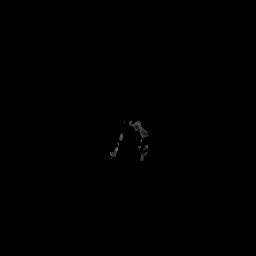

[Series 6: DWI · coronal · 5.0mm · 1.80mm/px · 5 of 37 slices shown (2 of 4)]
[im 1/37]
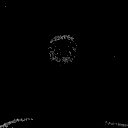
[im 10/37]
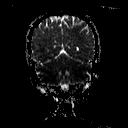
[im 19/37]
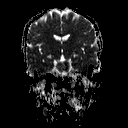
[im 28/37]
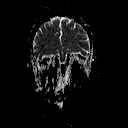
[im 37/37]
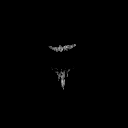

[Series 7: DWI · axial · 4.0mm · 0.94mm/px · z∈[-44,+120]mm · 6 of 42 slices shown (3 of 4)]
[im 1/42]
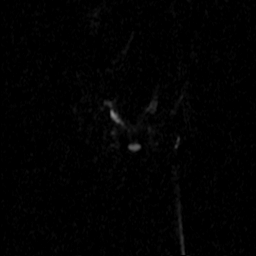
[im 9/42]
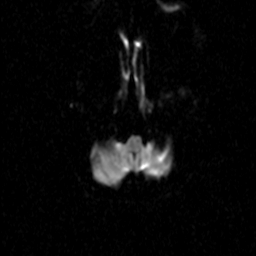
[im 17/42]
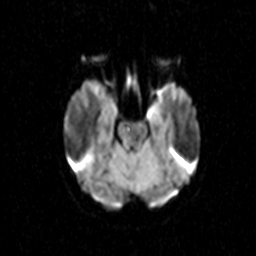
[im 25/42]
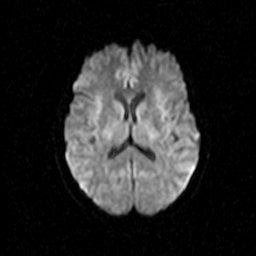
[im 33/42]
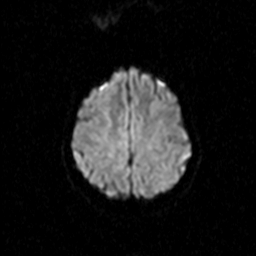
[im 42/42]
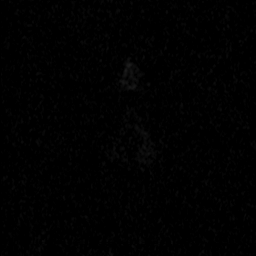

[Series 8: T2 · axial · 5.0mm · 0.45mm/px · z∈[-34,+128]mm · 4 of 26 slices shown (1 of 3)]
[im 1/26]
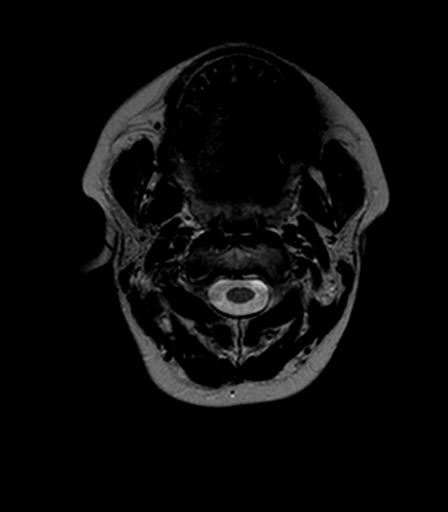
[im 9/26]
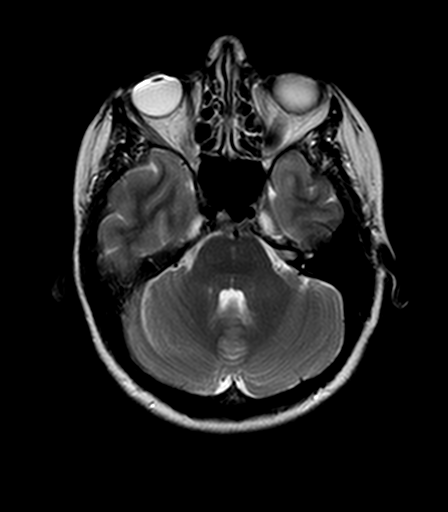
[im 17/26]
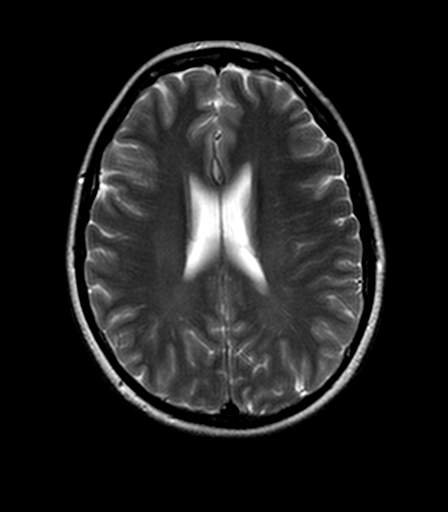
[im 26/26]
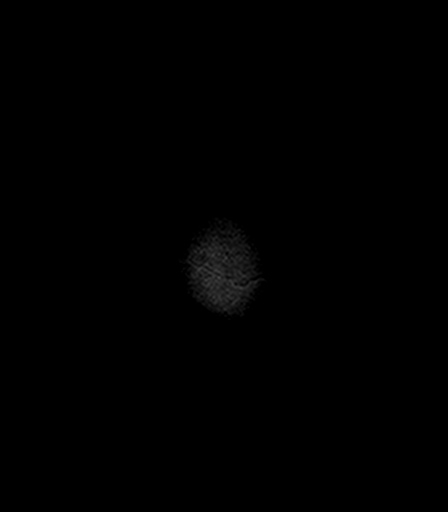

[Series 9: FLAIR · axial · 5.0mm · 0.90mm/px · z∈[-34,+128]mm · 4 of 26 slices shown (1 of 2)]
[im 1/26]
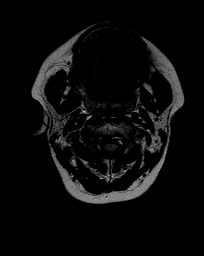
[im 9/26]
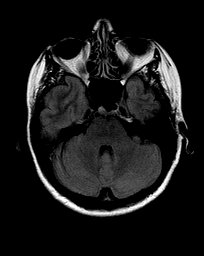
[im 17/26]
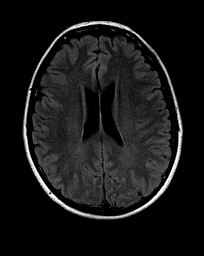
[im 26/26]
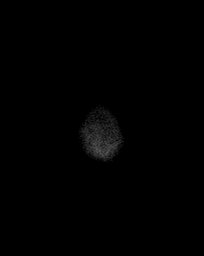

[Series 10: DWI · coronal · 5.0mm · 1.80mm/px · 5 of 33 slices shown (4 of 4)]
[im 1/33]
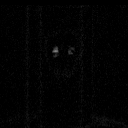
[im 9/33]
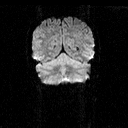
[im 17/33]
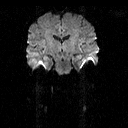
[im 25/33]
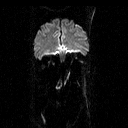
[im 33/33]
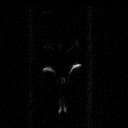

[Series 11: T2 · axial · 5.0mm · 0.45mm/px · z∈[-34,+128]mm · 4 of 26 slices shown (2 of 3)]
[im 1/26]
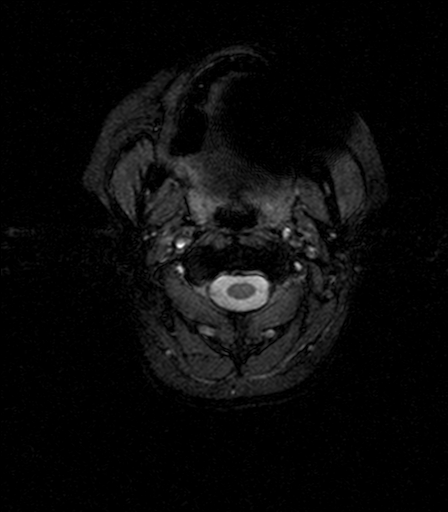
[im 9/26]
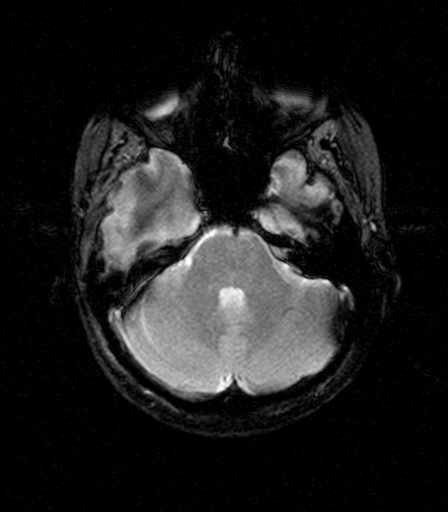
[im 17/26]
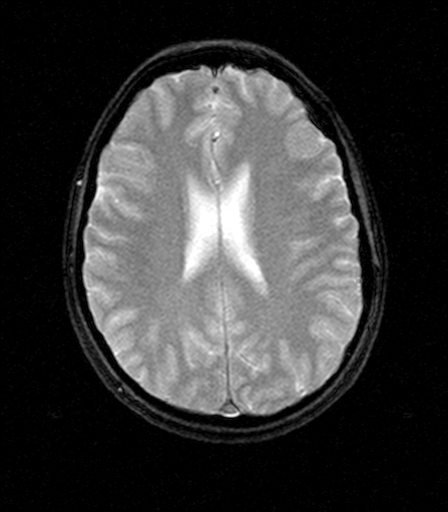
[im 26/26]
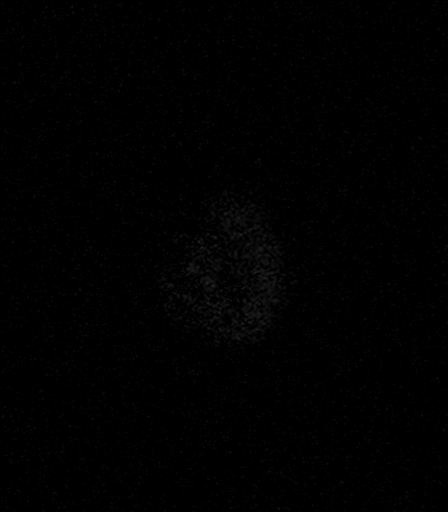

[Series 12: T1 · axial · 3.0mm · 0.45mm/px · z∈[-34,+35]mm · 4 of 56 slices shown]
[im 1/56]
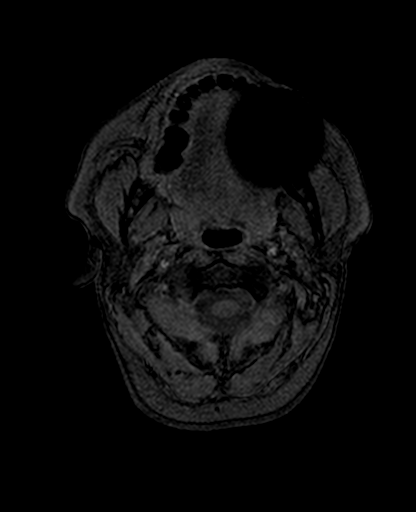
[im 8/56]
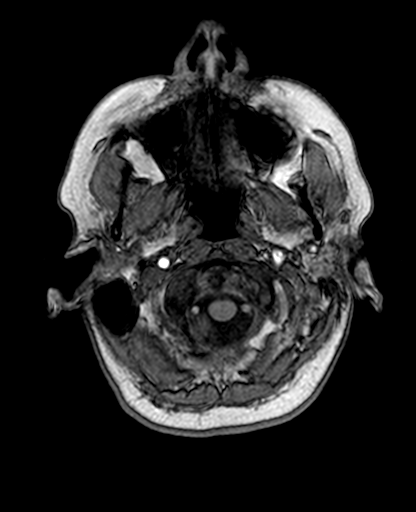
[im 16/56]
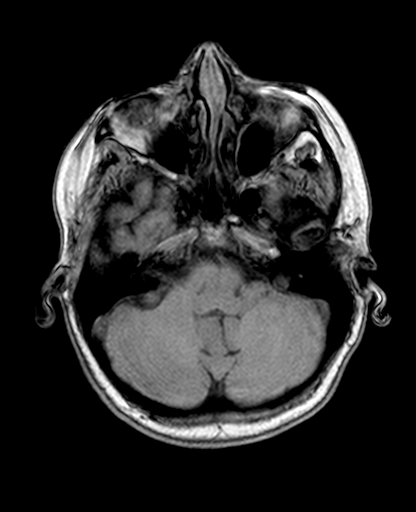
[im 24/56]
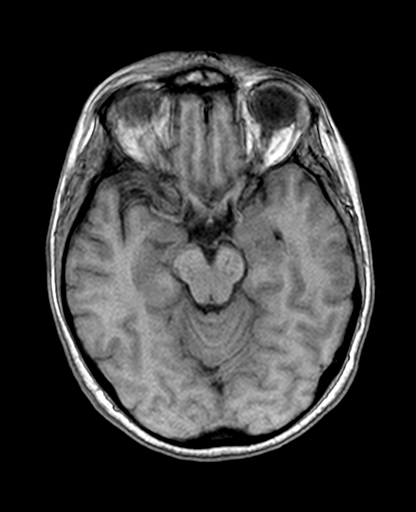

[Series 13: T2 · coronal · 5.0mm · 0.45mm/px · 4 of 29 slices shown (3 of 3)]
[im 1/29]
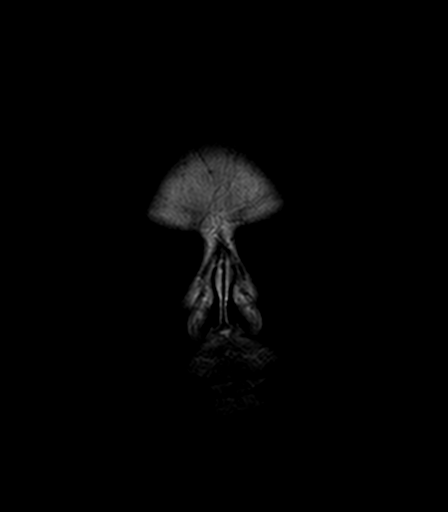
[im 10/29]
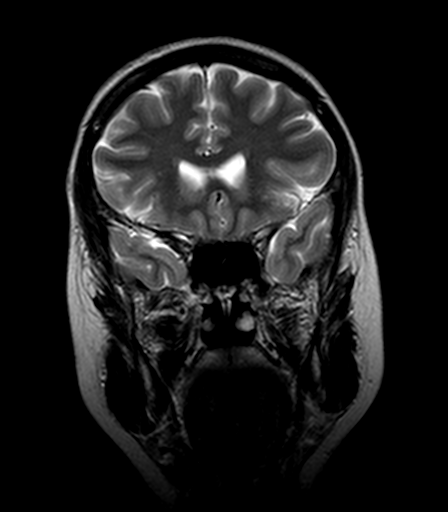
[im 19/29]
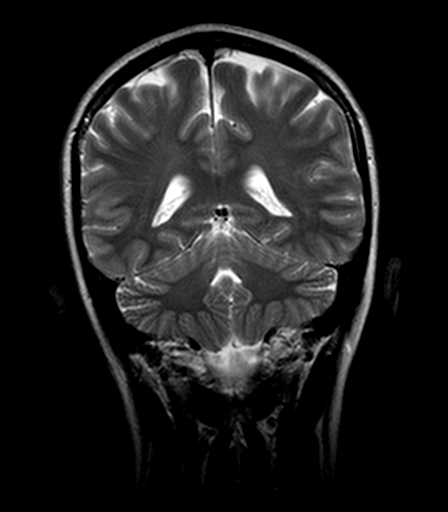
[im 29/29]
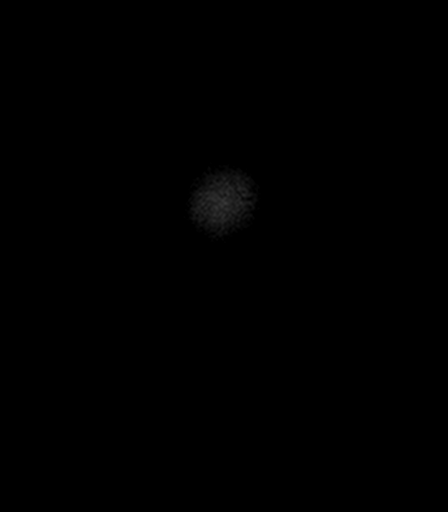

[Series 14: FLAIR · sagittal · 5.0mm · 0.90mm/px · 3 of 23 slices shown (2 of 2)]
[im 1/23]
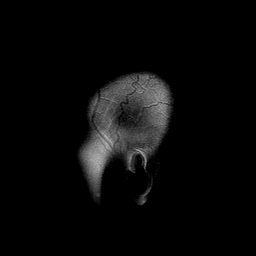
[im 12/23]
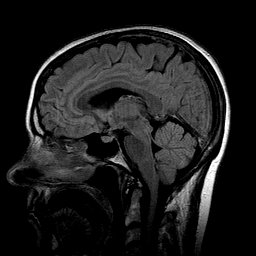
[im 23/23]
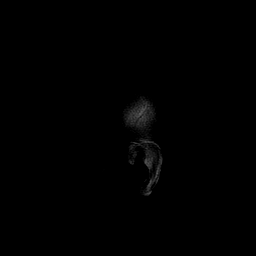

[44 of 48 positions shown; findings below may reference images not displayed]

FINDINGS: Ventricle size is normal. Cerebral volume is normal. Pituitary
normal in size. Craniocervical junction normal

Small 3 mm hyperintensities in the frontal white matter bilaterally.
Remainder of the white matter is normal. Mild hyperintensity in the
central pons. These findings are most likely due to chronic
microvascular ischemia. Demyelinating disease not considered likely.

Negative for acute infarct.  Negative for hemorrhage or mass

Paranasal sinuses clear.
IMPRESSION: No acute abnormality. Small hyperintensities in the pons and frontal
white matter bilaterally likely related to chronic microvascular
ischemia. Demyelinating disease considered unlikely.

## 2016-02-11 ENCOUNTER — Encounter: Payer: 59 | Admitting: Physical Therapy

## 2016-02-16 ENCOUNTER — Encounter: Payer: 59 | Admitting: Physical Therapy

## 2016-02-18 ENCOUNTER — Encounter: Payer: 59 | Admitting: Physical Therapy

## 2016-02-21 ENCOUNTER — Encounter: Payer: 59 | Admitting: Physical Therapy

## 2016-02-22 ENCOUNTER — Encounter: Payer: 59 | Admitting: Physical Therapy

## 2016-02-24 ENCOUNTER — Encounter: Payer: 59 | Admitting: Physical Therapy

## 2017-03-12 ENCOUNTER — Ambulatory Visit (INDEPENDENT_AMBULATORY_CARE_PROVIDER_SITE_OTHER): Payer: 59 | Admitting: Family Medicine

## 2017-03-12 ENCOUNTER — Encounter: Payer: Self-pay | Admitting: Family Medicine

## 2017-03-12 VITALS — BP 110/80 | HR 78 | Temp 98.0°F | Resp 16 | Ht 63.0 in | Wt 134.0 lb

## 2017-03-12 DIAGNOSIS — E559 Vitamin D deficiency, unspecified: Secondary | ICD-10-CM

## 2017-03-12 DIAGNOSIS — E785 Hyperlipidemia, unspecified: Secondary | ICD-10-CM | POA: Diagnosis not present

## 2017-03-12 DIAGNOSIS — D509 Iron deficiency anemia, unspecified: Secondary | ICD-10-CM | POA: Diagnosis not present

## 2017-03-12 DIAGNOSIS — Z23 Encounter for immunization: Secondary | ICD-10-CM | POA: Diagnosis not present

## 2017-03-12 DIAGNOSIS — Z0000 Encounter for general adult medical examination without abnormal findings: Secondary | ICD-10-CM

## 2017-03-12 DIAGNOSIS — Z1159 Encounter for screening for other viral diseases: Secondary | ICD-10-CM

## 2017-03-12 DIAGNOSIS — M8580 Other specified disorders of bone density and structure, unspecified site: Secondary | ICD-10-CM

## 2017-03-12 NOTE — Progress Notes (Signed)
Patient: Anne Swanson, Female    DOB: 1960/08/13, 56 y.o.   MRN: [id number] Visit Date: 03/12/2017  Today's Provider: Mila Merryonald Fisher, MD   Chief Complaint  Patient presents with  . Annual Exam  . Hyperlipidemia  . Anemia  . Osteoporosis   Subjective:    Annual physical exam Anne Swanson is a 56 y.o. female who presents today for health maintenance and complete physical. She feels well. She reports exercising walking. She reports she is sleeping fairly well.  ----------------------------------------------------------------    Lipid/Cholesterol, Follow-up:   Last seen for this 12/17/2015.  Management since that visit includes; no changes.  Last Lipid Panel:    Component Value Date/Time   CHOL 270 (H) 06/15/2015 0945   TRIG 98 06/15/2015 0945   HDL 62 06/15/2015 0945   CHOLHDL 4.4 06/15/2015 0945   LDLCALC 188 (H) 06/15/2015 0945    She reports good compliance with treatment. She is not having side effects. none  Wt Readings from Last 3 Encounters:  03/12/17 134 lb (60.8 kg)  12/17/15 130 lb (59 kg)  06/14/15 129 lb (58.5 kg)    ----------------------------------------------------------------   Vitamin D deficiency From 12/17/2015-labs checked, no changes.   Lab Results  Component Value Date   VD25OH 33.2 12/17/2015   She has not been taking vitamin D supplements for about a year.   Anemia, iron deficiency Lab Results  Component Value Date   WBC 5.1 01/25/2015   HGB 14.5 01/25/2015   HCT 42.6 01/25/2015   MCV 87.7 01/25/2015   PLT 179 01/25/2015             Review of Systems  Constitutional: Negative for chills, diaphoresis and fever.  HENT: Negative for congestion, ear discharge, ear pain, hearing loss, nosebleeds, sore throat and tinnitus.   Eyes: Negative for photophobia, pain, discharge and redness.  Respiratory: Negative for cough, shortness of breath, wheezing and stridor.   Cardiovascular: Negative for chest pain,  palpitations and leg swelling.  Gastrointestinal: Negative for abdominal pain, blood in stool, constipation, diarrhea, nausea and vomiting.  Endocrine: Negative for polydipsia.  Genitourinary: Negative for dysuria, flank pain, frequency, hematuria and urgency.  Musculoskeletal: Positive for arthralgias. Negative for back pain, myalgias and neck pain.  Skin: Negative for rash.  Allergic/Immunologic: Negative for environmental allergies.  Neurological: Positive for dizziness, tremors and numbness. Negative for seizures, weakness and headaches.  Hematological: Does not bruise/bleed easily.  Psychiatric/Behavioral: Negative for hallucinations and suicidal ideas. The patient is not nervous/anxious.   All other systems reviewed and are negative.   Social History      She  reports that  has never smoked. she has never used smokeless tobacco. She reports that she drinks alcohol. She reports that she does not use drugs.       Social History   Socioeconomic History  . Marital status: Married    Spouse name: None  . Number of children: 3  . Years of education: None  . Highest education level: None  Social Needs  . Financial resource strain: None  . Food insecurity - worry: None  . Food insecurity - inability: None  . Transportation needs - medical: None  . Transportation needs - non-medical: None  Occupational History  . Occupation: accountant    Comment: Self employed  Tobacco Use  . Smoking status: Never Smoker  . Smokeless tobacco: Never Used  Substance and Sexual Activity  . Alcohol use: Yes    Alcohol/week: 0.0 oz  Comment: once a month  . Drug use: No  . Sexual activity: None  Other Topics Concern  . None  Social History Narrative  . None    Past Medical History:  Diagnosis Date  . Asthma   . Fibrocystic breast disease   . High cholesterol   . Osteopenia      Patient Active Problem List   Diagnosis Date Noted  . Hyperlipidemia 03/01/2015  . Hematuria  12/02/2014  . 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 Fatigue 11/16/2014  . Fibrocystic breast disease 11/16/2014  . Anemia, iron deficiency 11/16/2014  . Osteopenia 11/16/2014  . Recurrent UTI 11/16/2014  . Closed rib fracture 11/16/2014  . Vitamin D deficiency 11/16/2014  . Dizziness 11/16/2014  . Closed fracture of one rib 11/16/2014    Past Surgical History:  Procedure Laterality Date  . CESAREAN SECTION     x3  . WISDOM TOOTH EXTRACTION      Family History        Family Status  Relation Name Status  . Mother  Deceased at age 7       Breast cancer and colon cancer  . Father  Deceased at age 64       died from MI  . Brother  Deceased at age 64       fdied from Leukemia  . Mat Uncle  (Not Specified)        Her family history includes Cancer in her maternal uncle; Coronary artery disease in her father; Schizophrenia in her father.     Allergies  Allergen Reactions  . Lodine [Etodolac] Other (See Comments)    Facial numbness     Current Outpatient Medications:  Marland Kitchen  VENTOLIN HFA 108 (90 Base) MCG/ACT inhaler, USE 2 PUFFS EVERY 4 TO 6 HOURS AS NEEDED, Disp: 18 Inhaler, Rfl: 2 .  aspirin 81 MG tablet, Take 81 mg by mouth daily., Disp: , Rfl:  .  Cholecalciferol (VITAMIN D3) 2000 units TABS, Take 1 tablet by mouth daily., Disp: , Rfl:    Patient Care Team: Malva Limes, MD as PCP - General (Family Medicine)      Objective:   Vitals: BP 110/80 (BP Location: Right Arm, Patient Position: Sitting, Cuff Size: Normal)   Pulse 78   Temp 98 F (36.7 C) (Oral)   Resp 16   Ht 5\' 3"  (1.6 m)   Wt 134 lb (60.8 kg)   LMP 02/15/2014   SpO2 97%   BMI 23.74 kg/m    Vitals:   03/12/17 1019  BP: 110/80  Pulse: 78  Resp: 16  Temp: 98 F (36.7 C)  TempSrc: Oral  SpO2: 97%  Weight: 134 lb (60.8 kg)  Height: 5\' 3"  (1.6 m)     Physical Exam   General Appearance:    Alert, cooperative, no distress, appears stated age  Head:    Normocephalic, without obvious  abnormality, atraumatic  Eyes:    PERRL, conjunctiva/corneas clear, EOM's intact, fundi    benign, both eyes  Ears:    Normal TM's and external ear canals, both ears  Nose:   Nares normal, septum midline, mucosa normal, no drainage    or sinus tenderness  Throat:   Lips, mucosa, and tongue normal; teeth and gums normal  Neck:   Supple, symmetrical, trachea midline, no adenopathy;    thyroid:  no enlargement/tenderness/nodules; no carotid   bruit or JVD  Back:     Symmetric, no curvature, ROM normal, no CVA tenderness  Lungs:  Clear to auscultation bilaterally, respirations unlabored  Chest Wall:    No tenderness or deformity   Heart:    Regular rate and rhythm, S1 and S2 normal, no murmur, rub   or gallop  Breast Exam:    normal appearance, no masses or tenderness  Abdomen:     Soft, non-tender, bowel sounds active all four quadrants,    no masses, no organomegaly  Pelvic:    cervix normal in appearance, external genitalia normal and no cervical motion tenderness  Extremities:   Extremities normal, atraumatic, no cyanosis or edema  Pulses:   2+ and symmetric all extremities  Skin:   Skin color, texture, turgor normal, no rashes or lesions  Lymph nodes:   Cervical, supraclavicular, and axillary nodes normal  Neurologic:   CNII-XII intact, normal strength, sensation and reflexes    throughout    Depression Screen PHQ 2/9 Scores 03/12/2017 06/14/2015  PHQ - 2 Score 0 0  PHQ- 9 Score 1 0      Assessment & Plan:     Routine Health Maintenance and Physical Exam  Exercise Activities and Dietary recommendations Goals    None      Immunization History  Administered Date(s) Administered  . Tdap 03/19/2007    Health Maintenance  Topic Date Due  . Hepatitis C Screening  [birth year]  . HIV Screening  09/01/1975  . PAP SMEAR  05/18/2014  . MAMMOGRAM  01/15/2015  . INFLUENZA VACCINE  11/15/2016  . TETANUS/TDAP  03/18/2017  . COLONOSCOPY  11/06/2022     Discussed  health benefits of physical activity, and encouraged her to engage in regular exercise appropriate for her age and condition.    --------------------------------------------------------------------  1. Annual physical exam  - COMPLETE METABOLIC PANEL WITH GFR - CBC - Lipid panel - Comprehensive metabolic panel - Pap,SurePath with HPV  2. Vitamin D deficiency  - VITAMIN D 25 Hydroxy (Vit-D Deficiency, Fractures)  3. Hyperlipidemia, unspecified hyperlipidemia type  - COMPLETE METABOLIC PANEL WITH GFR - Lipid panel - Comprehensive metabolic panel  4. Iron deficiency anemia, unspecified iron deficiency anemia type  - CBC  5. Osteopenia, unspecified location   6. Need for hepatitis C screening test  - Hepatitis C antibody  7. Need for influenza vaccination  - Flu Vaccine QUAD 36+ mos IM   Mila Merryonald Fisher, MD  [facility]

## 2017-03-13 LAB — CBC
HEMATOCRIT: 41 % (ref 34.0–46.6)
Hemoglobin: 14.1 g/dL (ref 11.1–15.9)
MCH: 29 pg (ref 26.6–33.0)
MCHC: 34.4 g/dL (ref 31.5–35.7)
MCV: 84 fL (ref 79–97)
Platelets: 205 10*3/uL (ref 150–379)
RBC: 4.87 x10E6/uL (ref 3.77–5.28)
RDW: 13.6 % (ref 12.3–15.4)
WBC: 4.9 10*3/uL (ref 3.4–10.8)

## 2017-03-13 LAB — VITAMIN D 25 HYDROXY (VIT D DEFICIENCY, FRACTURES): Vit D, 25-Hydroxy: 21.9 ng/mL — ABNORMAL LOW (ref 30.0–100.0)

## 2017-03-13 LAB — COMPREHENSIVE METABOLIC PANEL
ALBUMIN: 4.4 g/dL (ref 3.5–5.5)
ALK PHOS: 106 IU/L (ref 39–117)
ALT: 17 IU/L (ref 0–32)
AST: 22 IU/L (ref 0–40)
Albumin/Globulin Ratio: 1.5 (ref 1.2–2.2)
BUN / CREAT RATIO: 13 (ref 9–23)
BUN: 9 mg/dL (ref 6–24)
Bilirubin Total: 0.4 mg/dL (ref 0.0–1.2)
CO2: 25 mmol/L (ref 20–29)
CREATININE: 0.72 mg/dL (ref 0.57–1.00)
Calcium: 9.5 mg/dL (ref 8.7–10.2)
Chloride: 102 mmol/L (ref 96–106)
GFR calc Af Amer: 108 mL/min/{1.73_m2} (ref >59)
GFR calc non Af Amer: 94 mL/min/{1.73_m2} (ref >59)
GLUCOSE: 96 mg/dL (ref 65–99)
Globulin, Total: 2.9 g/dL (ref 1.5–4.5)
Potassium: 4.6 mmol/L (ref 3.5–5.2)
Sodium: 142 mmol/L (ref 134–144)
Total Protein: 7.3 g/dL (ref 6.0–8.5)

## 2017-03-13 LAB — LIPID PANEL
CHOL/HDL RATIO: 4.8 ratio — AB (ref 0.0–4.4)
Cholesterol, Total: 252 mg/dL — ABNORMAL HIGH (ref 100–199)
HDL: 52 mg/dL (ref >39)
LDL Calculated: 177 mg/dL — ABNORMAL HIGH (ref 0–99)
Triglycerides: 115 mg/dL (ref 0–149)
VLDL Cholesterol Cal: 23 mg/dL (ref 5–40)

## 2017-03-13 LAB — HEPATITIS C ANTIBODY

## 2017-03-14 LAB — PAP IG AND HPV HIGH-RISK: HPV DNA High Risk: NOT DETECTED

## 2017-05-01 ENCOUNTER — Ambulatory Visit: Payer: Managed Care, Other (non HMO) | Admitting: Family Medicine

## 2017-05-01 ENCOUNTER — Ambulatory Visit
Admission: RE | Admit: 2017-05-01 | Discharge: 2017-05-01 | Disposition: A | Discharge status: Home / Self Care | Payer: Managed Care, Other (non HMO) | Source: Ambulatory Visit | Attending: Family Medicine | Admitting: Family Medicine

## 2017-05-01 ENCOUNTER — Encounter: Payer: Self-pay | Admitting: Family Medicine

## 2017-05-01 VITALS — BP 100/60 | HR 77 | Temp 98.0°F | Resp 16 | Wt 132.0 lb

## 2017-05-01 DIAGNOSIS — R05 Cough: Secondary | ICD-10-CM

## 2017-05-01 DIAGNOSIS — R059 Cough, unspecified: Secondary | ICD-10-CM

## 2017-05-01 DIAGNOSIS — R918 Other nonspecific abnormal finding of lung field: Secondary | ICD-10-CM | POA: Insufficient documentation

## 2017-05-01 DIAGNOSIS — J45909 Unspecified asthma, uncomplicated: Secondary | ICD-10-CM | POA: Insufficient documentation

## 2017-05-01 MED ORDER — MONTELUKAST SODIUM 10 MG PO TABS: 10.0000 mg | ORAL_TABLET | Freq: Every day | ORAL | 3 refills | Status: DC

## 2017-05-01 MED ORDER — DOXYCYCLINE HYCLATE 100 MG PO TABS: 100.0000 mg | ORAL_TABLET | Freq: Two times a day (BID) | ORAL | 0 refills | Status: AC

## 2017-05-01 NOTE — Progress Notes (Signed)
       Patient: Anne Swanson Female    DOB: [birth year]   57 y.o.   MRN: [id number] Visit Date: 05/01/2017  Today's Provider: Mila Merryonald Fisher, MD   Chief Complaint  Patient presents with  . Cough    x 6 weeks   Subjective:    Cough  This is a recurrent problem. Episode onset: 6 weeks ago. The problem has been unchanged. The cough is non-productive (dry hacking cough). Associated symptoms include rhinorrhea, a sore throat (now resolved), shortness of breath, sweats (resolved) and wheezing. Pertinent negatives include no chest pain, chills, ear congestion, ear pain, fever, headaches or hemoptysis. Treatments tried: Prednisone, Antihistamine, Pulmicort inhaler, Ventolin inhaler and Antihistamines. The treatment provided no relief.  [facility] and was prescribed a 5 day course of Prednisone  and Pulmicort. Patient has completed both medications but has had no improvement. Feels like she is getting drainage down the back of her throat making her cough.     Allergies  Allergen Reactions  . Lodine [Etodolac] Other (See Comments)    Facial numbness     Current Outpatient Medications:  Marland Kitchen.  VENTOLIN HFA 108 (90 Base) MCG/ACT inhaler, USE 2 PUFFS EVERY 4 TO 6 HOURS AS NEEDED, Disp: 18 Inhaler, Rfl: 2 .  aspirin 81 MG tablet, Take 81 mg by mouth daily., Disp: , Rfl:  .  Cholecalciferol (VITAMIN D3) 2000 units TABS, Take 1 tablet by mouth daily., Disp: , Rfl:   Review of Systems  Constitutional: Negative for appetite change, chills, fatigue and fever.  HENT: Positive for rhinorrhea and sore throat (now resolved). Negative for ear pain.   Respiratory: Positive for cough (dry), chest tightness, shortness of breath and wheezing. Negative for hemoptysis.   Cardiovascular: Negative for chest pain and palpitations.  Gastrointestinal: Negative for abdominal pain, nausea and vomiting.  Neurological: Negative for dizziness, weakness and headaches.    Social  History   Tobacco Use  . Smoking status: Never Smoker  . Smokeless tobacco: Never Used  Substance Use Topics  . Alcohol use: Yes    Alcohol/week: 0.0 oz    Comment: once a month   Objective:   BP 100/60 (BP Location: Left Arm, Patient Position: Sitting, Cuff Size: Normal)   Pulse 77   Temp 98 F (36.7 C) (Oral)   Resp 16   Wt 132 lb (59.9 kg)   LMP 02/15/2014   SpO2 97% Comment: room air  BMI 23.38 kg/m  There were no vitals filed for this visit.   Physical Exam  General Appearance:    Alert, cooperative, no distress  HENT:   bilateral TM normal without fluid or infection, neck without nodes, throat normal without erythema or exudate, post nasal drip noted and nasal mucosa pale and congested  Eyes:    PERRL, conjunctiva/corneas clear, EOM's intact       Lungs:     Clear to auscultation bilaterally, respirations unlabored  Heart:    Regular rate and rhythm  Neurologic:   Awake, alert, oriented x 3. No apparent focal neurological           defect.           Assessment & Plan:     1. Cough  - DG Chest 2 View; Future  Consider montelukast and/or NS antihistamine if xray is normal      Mila Merryonald Fisher, MD  [facility]

## 2017-05-29 ENCOUNTER — Encounter: Payer: Self-pay | Admitting: Family Medicine

## 2017-05-29 ENCOUNTER — Ambulatory Visit: Payer: Managed Care, Other (non HMO) | Admitting: Family Medicine

## 2017-05-29 VITALS — BP 108/74 | HR 84 | Temp 98.3°F | Resp 16 | Wt 134.0 lb

## 2017-05-29 DIAGNOSIS — R1010 Upper abdominal pain, unspecified: Secondary | ICD-10-CM

## 2017-05-29 NOTE — Progress Notes (Signed)
Patient: Anne Swanson Female    DOB: [birth year]   57 y.o.   MRN: [id number] Visit Date: 05/29/2017  Today's Provider: Mila Merry, MD   Chief Complaint  Patient presents with  . Abdominal Pain   Subjective:    Abdominal Pain  This is a new problem. The current episode started in the past 7 days (4 days). The onset quality is sudden. The problem has been waxing and waning. The pain is located in the epigastric region. The quality of the pain is aching, a sensation of fullness and sharp. Associated symptoms include nausea. Pertinent negatives include no constipation, diarrhea, fever, frequency or vomiting.   Patient reports that 4 days ago she had a sharp sensation in her epigastric area that lasted for 1 hour and a half. Pain was about 3 inches to both sides of midline across upper abdomen.  She reports that her symptoms came on all of a sudden. She reports that her symptoms were so severe, that it was hard to take a deep breath. She did not take anything OTC for her symptoms. She reports that her symptoms completely went away on its own. She denies any vomiting or changes in her bowel movements. She does have occasional nausea.     Allergies  Allergen Reactions  . Lodine [Etodolac] Other (See Comments)    Facial numbness     Current Outpatient Medications:  .  aspirin 81 MG tablet, Take 81 mg by mouth daily., Disp: , Rfl:  .  Cholecalciferol (VITAMIN D3) 2000 units TABS, Take 1 tablet by mouth daily., Disp: , Rfl:  .  montelukast (SINGULAIR) 10 MG tablet, Take 1 tablet (10 mg total) by mouth at bedtime., Disp: 30 tablet, Rfl: 3 .  VENTOLIN HFA 108 (90 Base) MCG/ACT inhaler, USE 2 PUFFS EVERY 4 TO 6 HOURS AS NEEDED, Disp: 18 Inhaler, Rfl: 2  Review of Systems  Constitutional: Positive for activity change and fatigue. Negative for appetite change, chills, fever and unexpected weight change.  Gastrointestinal: Positive for abdominal pain and nausea. Negative for abdominal  distention, blood in stool, constipation, diarrhea and vomiting.  Genitourinary: Negative for difficulty urinating, dyspareunia, frequency, menstrual problem and urgency.  Musculoskeletal: Negative.     Social History   Tobacco Use  . Smoking status: Never Smoker  . Smokeless tobacco: Never Used  Substance Use Topics  . Alcohol use: Yes    Alcohol/week: 0.0 oz    Comment: once a month   Objective:   BP 108/74 (BP Location: Left Arm, Patient Position: Sitting, Cuff Size: Normal)   Pulse 84   Temp 98.3 F (36.8 C)   Resp 16   Wt 134 lb (60.8 kg)   LMP 02/15/2014   SpO2 98%   BMI 23.74 kg/m  Vitals:   05/29/17 1630  BP: 108/74  Pulse: 84  Resp: 16  Temp: 98.3 F (36.8 C)  SpO2: 98%  Weight: 134 lb (60.8 kg)     Physical Exam  General appearance: alert, well developed, well nourished, cooperative and in no distress Head: Normocephalic, without obvious abnormality, atraumatic Respiratory: Respirations even and unlabored, normal respiratory rate Extremities: No gross deformities Skin: Skin color, texture, turgor normal. No rashes seen  Psych: Appropriate mood and affect. Neurologic: Mental status: Alert, oriented to person, place, and time, thought content appropriate.     Assessment & Plan:     1. Upper abdominal pain Symptoms completely resolved after 1 1/2 hours. Highly suspicious for passing  gallstones. Consider CT if u/s is normal.  - US Abdomen Complete; Future       Mila Merryonald Fisher, MD  [facility]

## 2017-06-01 ENCOUNTER — Telehealth: Payer: Self-pay | Admitting: *Deleted

## 2017-06-01 ENCOUNTER — Ambulatory Visit
Admission: RE | Admit: 2017-06-01 | Discharge: 2017-06-01 | Disposition: A | Discharge status: Home / Self Care | Payer: Managed Care, Other (non HMO) | Source: Ambulatory Visit | Attending: Family Medicine | Admitting: Family Medicine

## 2017-06-01 DIAGNOSIS — K8020 Calculus of gallbladder without cholecystitis without obstruction: Secondary | ICD-10-CM | POA: Insufficient documentation

## 2017-06-01 DIAGNOSIS — R1010 Upper abdominal pain, unspecified: Secondary | ICD-10-CM | POA: Diagnosis not present

## 2017-06-01 NOTE — Telephone Encounter (Signed)
-----   Message from Malva Limesonald E Fisher, MD sent at 06/01/2017  1:29 PM EST ----- Ultrasound confirms multiple gallstones which are probably responsible for gallbladder attack she had last weekend. Recommend referral to surgery to discuss cholecystectomy.

## 2017-06-01 NOTE — Telephone Encounter (Signed)
Patient was notified of results. Referral ordered.

## 2017-06-01 NOTE — Telephone Encounter (Signed)
Please schedule referral to general surgery.

## 2017-06-05 ENCOUNTER — Telehealth: Payer: Self-pay | Admitting: Gastroenterology

## 2017-06-05 NOTE — Telephone Encounter (Signed)
Patient called stating she had been referred to Dr Lemar LivingsByrnett for her gallbladder by Dr Sherrie MustacheFisher.She is found of Dr Servando SnareWohl & wanted to here or at least talk with him about the referral.

## 2017-06-06 NOTE — Telephone Encounter (Signed)
Pt was wanting to know if Dr. Servando SnareWohl removed gallbladders.

## 2017-06-28 ENCOUNTER — Encounter: Payer: Self-pay | Admitting: General Surgery

## 2017-06-28 ENCOUNTER — Ambulatory Visit: Payer: Managed Care, Other (non HMO) | Admitting: General Surgery

## 2017-06-28 VITALS — BP 122/72 | HR 87 | Resp 11 | Ht 63.0 in | Wt 134.0 lb

## 2017-06-28 DIAGNOSIS — K8020 Calculus of gallbladder without cholecystitis without obstruction: Secondary | ICD-10-CM | POA: Diagnosis not present

## 2017-06-28 NOTE — Progress Notes (Signed)
Patient ID: Anne Swanson, female   DOB: January 06, 1961, 57 y.o.   MRN: [id number]  Chief Complaint  Patient presents with  . Cholelithiasis    HPI Anne Swanson is a 57 y.o. female.  Here for evaluation of gall stones referred by Dr Sherrie Mustache. She states she had an episode one month ago where she had a "muscle spasm" upper abdominal "band" at rib line, lasting 1.5 hours. She was nauseated at that time, no vomiting. She had eaten a lot of pizza at lunch that day.She does not recall having this before. Ultrasound was 06-01-17.  HPI  Past Medical History:  Diagnosis Date  . Anemia   . Asthma   . Fibrocystic breast disease   . High cholesterol   . Migraines   . Osteopenia   . Vertigo     Past Surgical History:  Procedure Laterality Date  . CESAREAN SECTION     x3  . WISDOM TOOTH EXTRACTION      Family History  Problem Relation Age of Onset  . Colon cancer Mother   . Breast cancer Mother   . Coronary artery disease Father   . Schizophrenia Father   . Leukemia Brother   . Stomach cancer Maternal Uncle     Social History Social History   Tobacco Use  . Smoking status: Never Smoker  . Smokeless tobacco: Never Used  Substance Use Topics  . Alcohol use: Yes    Alcohol/week: 0.0 oz    Comment: once a month  . Drug use: No    Allergies  Allergen Reactions  . Lodine [Etodolac] Other (See Comments)    Facial numbness    Current Outpatient Medications  Medication Sig Dispense Refill  . diphenhydrAMINE (SOMINEX) 25 MG tablet Take 25 mg by mouth as needed for sleep.    . montelukast (SINGULAIR) 10 MG tablet Take 1 tablet (10 mg total) by mouth at bedtime. 30 tablet 3  . VENTOLIN HFA 108 (90 Base) MCG/ACT inhaler USE 2 PUFFS EVERY 4 TO 6 HOURS AS NEEDED 18 Inhaler 2  . aspirin 81 MG tablet Take 81 mg by mouth daily.    . Cholecalciferol (VITAMIN D3) 2000 units TABS Take 1 tablet by mouth daily.     No current facility-administered medications for this visit.      Review of Systems Review of Systems  Constitutional: Negative.   Respiratory: Negative.   Cardiovascular: Negative.   Gastrointestinal: Positive for abdominal pain and nausea. Negative for constipation and vomiting.    Blood pressure 122/72, pulse 87, resp. rate 11, height 5\' 3"  (1.6 m), weight 134 lb (60.8 kg), last menstrual period 02/15/2014, SpO2 99 %.  Physical Exam Physical Exam  Constitutional: She is oriented to person, place, and time. She appears well-developed and well-nourished.  HENT:  Mouth/Throat: Oropharynx is clear and moist.  Eyes: Conjunctivae are normal. No scleral icterus.  Neck: Neck supple.  Cardiovascular: Normal rate, regular rhythm and normal heart sounds.  Pulmonary/Chest: Effort normal and breath sounds normal.  Abdominal: Soft. Normal appearance and bowel sounds are normal. There is no tenderness.  Lymphadenopathy:    She has no cervical adenopathy.  Neurological: She is alert and oriented to person, place, and time.  Skin: Skin is warm and dry.  Psychiatric: Her behavior is normal.    Data Reviewed Abdominal ultrasound dated June 01, 2017 showed multiple stones up to 12 mm in diameter.  No gallbladder wall thickening. Chest x-ray dated May 01, 2017 for history of cough  for 6 weeks was negative.  Mild hyperinflation of the lungs.  Comprehensive metabolic panel of March 12, 2017 was unremarkable.   Assessment    Cholelithiasis, recent episode of biliary colic.    Plan    Laparoscopic Cholecystectomy with Intraoperative Cholangiogram. The procedure, including it's potential risks and complications (including but not limited to infection, bleeding, injury to intra-abdominal organs or bile ducts, bile leak, poor cosmetic result, sepsis and death) were discussed with the patient in detail. Non-operative options, including their inherent risks (acute calculous cholecystitis with possible choledocholithiasis or gallstone pancreatitis,  with the risk of ascending cholangitis, sepsis, and death) were discussed as well. The patient expressed and understanding of what we discussed and wishes to proceed with laparoscopic cholecystectomy. The patient further understands that if it is technically not possible, or it is unsafe to proceed laparoscopically, that I will convert to an open cholecystectomy.      HPI, Physical Exam, Assessment and Plan have been scribed under the direction and in the presence of Earline MayotteJeffrey W. Weda Baumgarner, MD. Dorathy DaftMarsha Hatch, RN  I have completed the exam and reviewed the above documentation for accuracy and completeness.  I agree with the above.  [employer] has been used and any errors in dictation or transcription are unintentional.  Donnalee CurryJeffrey [place] Sloop, M.D., F.A.C.S.  Patient wishes to think about having gallbladder surgery and be contacted once the May 2019 surgery schedule is available. The patient will qualify for a phone interview per Dr. Lemar LivingsByrnett. No pre-op visit will be needed if she is scheduled for surgery in May. History and physical will be updated the morning of procedure. It is okay for patient to continue an 81 mg aspirin once daily. The patient was provided with day of surgery instructions today.   Nicholes MangoMichele J. Bailey, CMA    Merrily PewJeffrey W Sirenia Whitis 06/29/2017, 8:01 PM

## 2017-06-28 NOTE — Patient Instructions (Addendum)
The patient is aware to call back for any questions or concerns.  Laparoscopic Cholecystectomy Laparoscopic cholecystectomy is surgery to remove the gallbladder. The gallbladder is a pear-shaped organ that lies beneath the liver on the right side of the body. The gallbladder stores bile, which is a fluid that helps the body to digest fats. Cholecystectomy is often done for inflammation of the gallbladder (cholecystitis). This condition is usually caused by a buildup of gallstones (cholelithiasis) in the gallbladder. Gallstones can block the flow of bile, which can result in inflammation and pain. In severe cases, emergency surgery may be required. This procedure is done though small incisions in your abdomen (laparoscopic surgery). A thin scope with a camera (laparoscope) is inserted through one incision. Thin surgical instruments are inserted through the other incisions. In some cases, a laparoscopic procedure may be turned into a type of surgery that is done through a larger incision (open surgery). Tell a health care provider about:  Any allergies you have.  All medicines you are taking, including vitamins, herbs, eye drops, creams, and over-the-counter medicines.  Any problems you or family members have had with anesthetic medicines.  Any blood disorders you have.  Any surgeries you have had.  Any medical conditions you have.  Whether you are pregnant or may be pregnant. What are the risks? Generally, this is a safe procedure. However, problems may occur, including:  Infection.  Bleeding.  Allergic reactions to medicines.  Damage to other structures or organs.  A stone remaining in the common bile duct. The common bile duct carries bile from the gallbladder into the small intestine.  A bile leak from the cyst duct that is clipped when your gallbladder is removed.  What happens before the procedure? Staying hydrated Follow instructions from your health care provider about  hydration, which may include:  Up to 2 hours before the procedure - you may continue to drink clear liquids, such as water, clear fruit juice, black coffee, and plain tea.  Eating and drinking restrictions Follow instructions from your health care provider about eating and drinking, which may include:  8 hours before the procedure - stop eating heavy meals or foods such as meat, fried foods, or fatty foods.  6 hours before the procedure - stop eating light meals or foods, such as toast or cereal.  6 hours before the procedure - stop drinking milk or drinks that contain milk.  2 hours before the procedure - stop drinking clear liquids.  Medicines  Ask your health care provider about: ? Changing or stopping your regular medicines. This is especially important if you are taking diabetes medicines or blood thinners. ? Taking medicines such as aspirin and ibuprofen. These medicines can thin your blood. Do not take these medicines before your procedure if your health care provider instructs you not to.  You may be given antibiotic medicine to help prevent infection. General instructions  Let your health care provider know if you develop a cold or an infection before surgery.  Plan to have someone take you home from the hospital or clinic.  Ask your health care provider how your surgical site will be marked or identified. What happens during the procedure?  To reduce your risk of infection: ? Your health care team will wash or sanitize their hands. ? Your skin will be washed with soap. ? Hair may be removed from the surgical area.  An IV tube may be inserted into one of your veins.  You will be given one   or more of the following: ? A medicine to help you relax (sedative). ? A medicine to make you fall asleep (general anesthetic).  A breathing tube will be placed in your mouth.  Your surgeon will make several small cuts (incisions) in your abdomen.  The laparoscope will be  inserted through one of the small incisions. The camera on the laparoscope will send images to a TV screen (monitor) in the operating room. This lets your surgeon see inside your abdomen.  Air-like gas will be pumped into your abdomen. This will expand your abdomen to give the surgeon more room to perform the surgery.  Other tools that are needed for the procedure will be inserted through the other incisions. The gallbladder will be removed through one of the incisions.  Your common bile duct may be examined. If stones are found in the common bile duct, they may be removed.  After your gallbladder has been removed, the incisions will be closed with stitches (sutures), staples, or skin glue.  Your incisions may be covered with a bandage (dressing). The procedure may vary among health care providers and hospitals. What happens after the procedure?  Your blood pressure, heart rate, breathing rate, and blood oxygen level will be monitored until the medicines you were given have worn off.  You will be given medicines as needed to control your pain.  Do not drive for 24 hours if you were given a sedative. This information is not intended to replace advice given to you by your health care provider. Make sure you discuss any questions you have with your health care provider. Document Released: 04/03/2005 Document Revised: 10/24/2015 Document Reviewed: 09/20/2015 Elsevier Interactive Patient Education  2018 Elsevier Inc.  

## 2017-06-29 DIAGNOSIS — K8020 Calculus of gallbladder without cholecystitis without obstruction: Secondary | ICD-10-CM | POA: Insufficient documentation

## 2017-07-09 ENCOUNTER — Telehealth: Payer: Self-pay | Admitting: *Deleted

## 2017-07-09 NOTE — Telephone Encounter (Signed)
Left message on cell phone for patient to call the office to arrange for gallbladder surgery since the May 2019 surgery schedule is available.

## 2017-07-09 NOTE — Telephone Encounter (Signed)
Patient called the office back and wishes to schedule [facility]bladder [facility] Dr. Lemar LivingsByrnett.   No pre-op visit is required. History and physical will be updated the morning of procedure.   It is okay for patient to continue an 81 mg aspirin once daily.   The patient was instructed to call the office should she have further questions.

## 2017-07-12 ENCOUNTER — Other Ambulatory Visit: Payer: Self-pay | Admitting: Family Medicine

## 2017-07-12 DIAGNOSIS — Z1231 Encounter for screening mammogram for malignant neoplasm of breast: Secondary | ICD-10-CM

## 2017-07-25 ENCOUNTER — Other Ambulatory Visit: Payer: Self-pay | Admitting: General Surgery

## 2017-07-30 ENCOUNTER — Other Ambulatory Visit: Payer: Self-pay

## 2017-07-30 ENCOUNTER — Encounter
Admission: RE | Admit: 2017-07-30 | Discharge: 2017-07-30 | Disposition: A | Discharge status: Home / Self Care | Payer: Managed Care, Other (non HMO) | Source: Ambulatory Visit | Attending: General Surgery | Admitting: General Surgery

## 2017-07-30 HISTORY — DX: Adverse effect of unspecified anesthetic, initial encounter: T41.45XA

## 2017-07-30 HISTORY — DX: Other complications of anesthesia, initial encounter: T88.59XA

## 2017-07-30 NOTE — Patient Instructions (Signed)
Your procedure is scheduled on: 08/06/17 Report to Day Surgery. [facility] SECOND FLOOR To find out your arrival time please call [phone] between 1PM - 3PM on 08/03/17  Remember: Instructions that are not followed completely may result in serious medical risk, up to and including death, or upon the discretion of your surgeon and anesthesiologist your surgery may need to be rescheduled.     _X__ 1. Do not eat food after midnight the night before your procedure.                 No gum chewing or hard candies. You may drink clear liquids up to 2 hours                 before you are scheduled to arrive for your surgery- DO not drink clear                 liquids within 2 hours of the start of your surgery.                 Clear Liquids include:  water, apple juice without pulp, clear carbohydrate                 drink such as Clearfast of Gartorade, Black Coffee or Tea (Do not add                 anything to coffee or tea).  __X__2.  On the morning of surgery brush your teeth with toothpaste and water, you                 may rinse your mouth with mouthwash if you wish.  Do not swallow any              toothpaste of mouthwash.     _X__ 3.  No Alcohol for 24 hours before or after surgery.   _X__ 4.  Do Not Smoke or use e-cigarettes For 24 Hours Prior to Your Surgery.                 Do not use any chewable tobacco products for at least 6 hours prior to                 surgery.  ____  5.  Bring all medications with you on the day of surgery if instructed.   _X___  6.  Notify your doctor if there is any change in your medical condition      (cold, fever, infections).     Do not wear jewelry, make-up, hairpins, clips or nail polish. Do not wear lotions, powders, or perfumes. You may wear deodorant. Do not shave 48 hours prior to surgery. Men may shave face and neck. Do not bring valuables to the hospital.    [facility] is not responsible for any belongings or  valuables.  Contacts, dentures or bridgework may not be worn into surgery. Leave your suitcase in the car. After surgery it may be brought to your room. For patients admitted to the hospital, discharge time is determined by your treatment team.   Patients discharged the day of surgery will not be allowed to drive home.    ____ Take these medicines the morning of surgery with A SIP OF WATER:    1. NONE  2.   3.   4.  5.  6.  ____ Fleet Enema (as directed)   ____ Use CHG Soap as directed  __X__ Use inhalers on the day  of surgery  ____ Stop metformin 2 days prior to surgery    ____ Take 1/2 of usual insulin dose the night before surgery. No insulin the morning          of surgery.   ____ Stop Coumadin/Plavix/aspirin on  ____ Stop Anti-inflammatories on   ____ Stop supplements until after surgery.    ____ Bring C-Pap to the hospital.

## 2017-08-01 ENCOUNTER — Ambulatory Visit
Admission: RE | Admit: 2017-08-01 | Discharge: 2017-08-01 | Disposition: A | Discharge status: Home / Self Care | Payer: Managed Care, Other (non HMO) | Source: Ambulatory Visit | Attending: Family Medicine | Admitting: Family Medicine

## 2017-08-01 DIAGNOSIS — Z1231 Encounter for screening mammogram for malignant neoplasm of breast: Secondary | ICD-10-CM | POA: Insufficient documentation

## 2017-08-02 ENCOUNTER — Telehealth: Payer: Self-pay | Admitting: *Deleted

## 2017-08-02 NOTE — Telephone Encounter (Signed)
-----   Message from Earline MayotteJeffrey W Byrnett, MD sent at 08/01/2017  5:00 PM EDT ----- Please notify the patient I looked at her mammograms from today as well as her study in 2016.  She has dense breasts which are very common.  Otherwise I see nothing outstanding.  Report is pending.  Should be available by Monday when she has surgery.

## 2017-08-02 NOTE — Telephone Encounter (Signed)
Message left on patient's cell phone as instructed.

## 2017-08-06 ENCOUNTER — Ambulatory Visit: Payer: Managed Care, Other (non HMO) | Admitting: Anesthesiology

## 2017-08-06 ENCOUNTER — Encounter
Admission: RE | Disposition: A | Discharge status: Home / Self Care | Payer: Self-pay | Source: Ambulatory Visit | Attending: General Surgery

## 2017-08-06 ENCOUNTER — Ambulatory Visit: Payer: Managed Care, Other (non HMO)

## 2017-08-06 ENCOUNTER — Ambulatory Visit
Admission: RE | Admit: 2017-08-06 | Discharge: 2017-08-06 | Disposition: A | Discharge status: Home / Self Care | Payer: Managed Care, Other (non HMO) | Source: Ambulatory Visit | Attending: General Surgery | Admitting: General Surgery

## 2017-08-06 ENCOUNTER — Encounter: Payer: Self-pay | Admitting: *Deleted

## 2017-08-06 ENCOUNTER — Other Ambulatory Visit: Payer: Self-pay

## 2017-08-06 DIAGNOSIS — K819 Cholecystitis, unspecified: Secondary | ICD-10-CM | POA: Diagnosis present

## 2017-08-06 DIAGNOSIS — J45909 Unspecified asthma, uncomplicated: Secondary | ICD-10-CM | POA: Insufficient documentation

## 2017-08-06 DIAGNOSIS — K8020 Calculus of gallbladder without cholecystitis without obstruction: Secondary | ICD-10-CM | POA: Diagnosis not present

## 2017-08-06 DIAGNOSIS — Z419 Encounter for procedure for purposes other than remedying health state, unspecified: Secondary | ICD-10-CM

## 2017-08-06 DIAGNOSIS — K8010 Calculus of gallbladder with chronic cholecystitis without obstruction: Secondary | ICD-10-CM | POA: Insufficient documentation

## 2017-08-06 HISTORY — PX: CHOLECYSTECTOMY: SHX55

## 2017-08-06 SURGERY — LAPAROSCOPIC CHOLECYSTECTOMY WITH INTRAOPERATIVE CHOLANGIOGRAM
Anesthesia: General | Wound class: Clean Contaminated

## 2017-08-06 MED ORDER — DEXAMETHASONE SODIUM PHOSPHATE 10 MG/ML IJ SOLN
INTRAMUSCULAR | Status: AC
Start: 2017-08-06 — End: ?
  Filled 2017-08-06: qty 1

## 2017-08-06 MED ORDER — SUGAMMADEX SODIUM 500 MG/5ML IV SOLN
INTRAVENOUS | Status: DC | PRN
  Administered 2017-08-06: 121.6 mg via INTRAVENOUS

## 2017-08-06 MED ORDER — LIDOCAINE HCL (PF) 2 % IJ SOLN
INTRAMUSCULAR | Status: AC
  Filled 2017-08-06: qty 10

## 2017-08-06 MED ORDER — SUCCINYLCHOLINE CHLORIDE 20 MG/ML IJ SOLN
INTRAMUSCULAR | Status: AC
  Filled 2017-08-06: qty 1

## 2017-08-06 MED ORDER — FENTANYL CITRATE (PF) 100 MCG/2ML IJ SOLN
INTRAMUSCULAR | Status: DC | PRN
  Administered 2017-08-06 (×2): 50 ug via INTRAVENOUS

## 2017-08-06 MED ORDER — ONDANSETRON HCL 4 MG/2ML IJ SOLN
INTRAMUSCULAR | Status: AC
  Filled 2017-08-06: qty 2

## 2017-08-06 MED ORDER — FAMOTIDINE 20 MG PO TABS
20.0000 mg | ORAL_TABLET | Freq: Once | ORAL | Status: AC
  Administered 2017-08-06: 20 mg via ORAL

## 2017-08-06 MED ORDER — ACETAMINOPHEN 10 MG/ML IV SOLN
INTRAVENOUS | Status: AC
  Filled 2017-08-06: qty 100

## 2017-08-06 MED ORDER — LIDOCAINE HCL (CARDIAC) PF 100 MG/5ML IV SOSY
PREFILLED_SYRINGE | INTRAVENOUS | Status: DC | PRN
  Administered 2017-08-06: 80 mg via INTRAVENOUS

## 2017-08-06 MED ORDER — EPHEDRINE SULFATE 50 MG/ML IJ SOLN
INTRAMUSCULAR | Status: DC | PRN
  Administered 2017-08-06: 10 mg via INTRAVENOUS

## 2017-08-06 MED ORDER — LACTATED RINGERS IV SOLN
INTRAVENOUS | Status: DC
  Administered 2017-08-06: 07:00:00 via INTRAVENOUS

## 2017-08-06 MED ORDER — PROPOFOL 10 MG/ML IV BOLUS
INTRAVENOUS | Status: AC
  Filled 2017-08-06: qty 20

## 2017-08-06 MED ORDER — EPHEDRINE SULFATE 50 MG/ML IJ SOLN
INTRAMUSCULAR | Status: AC
  Filled 2017-08-06: qty 1

## 2017-08-06 MED ORDER — ONDANSETRON HCL 4 MG/2ML IJ SOLN: 4.0000 mg | Freq: Once | INTRAMUSCULAR | Status: DC | PRN

## 2017-08-06 MED ORDER — SUGAMMADEX SODIUM 200 MG/2ML IV SOLN
INTRAVENOUS | Status: AC
  Filled 2017-08-06: qty 2

## 2017-08-06 MED ORDER — SODIUM CHLORIDE 0.9 % IJ SOLN
INTRAMUSCULAR | Status: AC
  Filled 2017-08-06: qty 50

## 2017-08-06 MED ORDER — ROCURONIUM BROMIDE 50 MG/5ML IV SOLN
INTRAVENOUS | Status: AC
  Filled 2017-08-06: qty 1

## 2017-08-06 MED ORDER — GLYCOPYRROLATE 0.2 MG/ML IJ SOLN
INTRAMUSCULAR | Status: AC
  Filled 2017-08-06: qty 1

## 2017-08-06 MED ORDER — GLYCOPYRROLATE 0.2 MG/ML IJ SOLN
INTRAMUSCULAR | Status: DC | PRN
  Administered 2017-08-06: 0.2 mg via INTRAVENOUS

## 2017-08-06 MED ORDER — DEXAMETHASONE SODIUM PHOSPHATE 10 MG/ML IJ SOLN
INTRAMUSCULAR | Status: DC | PRN
  Administered 2017-08-06: 5 mg via INTRAVENOUS

## 2017-08-06 MED ORDER — FENTANYL CITRATE (PF) 100 MCG/2ML IJ SOLN
INTRAMUSCULAR | Status: AC
  Filled 2017-08-06: qty 2

## 2017-08-06 MED ORDER — ROCURONIUM BROMIDE 100 MG/10ML IV SOLN
INTRAVENOUS | Status: DC | PRN
  Administered 2017-08-06: 40 mg via INTRAVENOUS

## 2017-08-06 MED ORDER — FENTANYL CITRATE (PF) 100 MCG/2ML IJ SOLN: 25.0000 ug | INTRAMUSCULAR | Status: DC | PRN

## 2017-08-06 MED ORDER — MIDAZOLAM HCL 2 MG/2ML IJ SOLN
INTRAMUSCULAR | Status: AC
  Filled 2017-08-06: qty 2

## 2017-08-06 MED ORDER — ACETAMINOPHEN 10 MG/ML IV SOLN
INTRAVENOUS | Status: DC | PRN
  Administered 2017-08-06: 1000 mg via INTRAVENOUS

## 2017-08-06 MED ORDER — ONDANSETRON HCL 4 MG/2ML IJ SOLN
INTRAMUSCULAR | Status: DC | PRN
  Administered 2017-08-06: 4 mg via INTRAVENOUS

## 2017-08-06 MED ORDER — FAMOTIDINE 20 MG PO TABS
ORAL_TABLET | ORAL | Status: AC
  Administered 2017-08-06: 20 mg via ORAL
  Filled 2017-08-06: qty 1

## 2017-08-06 MED ORDER — MIDAZOLAM HCL 2 MG/2ML IJ SOLN
INTRAMUSCULAR | Status: DC | PRN
  Administered 2017-08-06: 2 mg via INTRAVENOUS

## 2017-08-06 MED ORDER — PROPOFOL 10 MG/ML IV BOLUS
INTRAVENOUS | Status: DC | PRN
  Administered 2017-08-06: 150 mg via INTRAVENOUS

## 2017-08-06 MED ORDER — HYDROCODONE-ACETAMINOPHEN 5-325 MG PO TABS: 1.0000 | ORAL_TABLET | ORAL | 0 refills | Status: DC | PRN

## 2017-08-06 SURGICAL SUPPLY — 40 items
APPLIER CLIP ROT 10 11.4 M/L (STAPLE) ×3
BLADE SURG 11 STRL SS SAFETY (MISCELLANEOUS) ×3 IMPLANT
CANISTER SUCT 1200ML W/VALVE (MISCELLANEOUS) ×3 IMPLANT
CANNULA DILATOR  5MM W/SLV (CANNULA) ×2
CANNULA DILATOR 10 W/SLV (CANNULA) ×2 IMPLANT
CANNULA DILATOR 10MM W/SLV (CANNULA) ×1
CANNULA DILATOR 5 W/SLV (CANNULA) ×4 IMPLANT
CATH CHOLANG 76X19 KUMAR (CATHETERS) ×3 IMPLANT
CHLORAPREP W/TINT 26ML (MISCELLANEOUS) ×3 IMPLANT
CLIP APPLIE ROT 10 11.4 M/L (STAPLE) ×1 IMPLANT
CLOSURE WOUND 1/2 X4 (GAUZE/BANDAGES/DRESSINGS) ×1
CONRAY 60ML FOR OR (MISCELLANEOUS) ×3 IMPLANT
DISSECTOR KITTNER STICK (MISCELLANEOUS) ×1 IMPLANT
DISSECTORS/KITTNER STICK (MISCELLANEOUS) ×3
DRAPE SHEET LG 3/4 BI-LAMINATE (DRAPES) ×3 IMPLANT
DRSG TEGADERM 2-3/8X2-3/4 SM (GAUZE/BANDAGES/DRESSINGS) ×12 IMPLANT
DRSG TELFA 4X3 1S NADH ST (GAUZE/BANDAGES/DRESSINGS) ×3 IMPLANT
ELECT REM PT RETURN 9FT ADLT (ELECTROSURGICAL) ×3
ELECTRODE REM PT RTRN 9FT ADLT (ELECTROSURGICAL) ×1 IMPLANT
GLOVE BIO SURGEON STRL SZ7.5 (GLOVE) ×3 IMPLANT
GLOVE INDICATOR 8.0 STRL GRN (GLOVE) ×3 IMPLANT
GOWN STRL REUS W/ TWL LRG LVL3 (GOWN DISPOSABLE) ×3 IMPLANT
GOWN STRL REUS W/TWL LRG LVL3 (GOWN DISPOSABLE) ×6
IRRIGATION STRYKERFLOW (MISCELLANEOUS) ×1 IMPLANT
IRRIGATOR STRYKERFLOW (MISCELLANEOUS) ×3
IV LACTATED RINGERS 1000ML (IV SOLUTION) ×3 IMPLANT
KIT TURNOVER KIT A (KITS) ×3 IMPLANT
LABEL OR SOLS (LABEL) ×3 IMPLANT
NDL INSUFF ACCESS 14 VERSASTEP (NEEDLE) ×3 IMPLANT
NS IRRIG 500ML POUR BTL (IV SOLUTION) ×3 IMPLANT
PACK LAP CHOLECYSTECTOMY (MISCELLANEOUS) ×3 IMPLANT
POUCH SPECIMEN RETRIEVAL 10MM (ENDOMECHANICALS) IMPLANT
SCISSORS METZENBAUM CVD 33 (INSTRUMENTS) ×3 IMPLANT
STRIP CLOSURE SKIN 1/2X4 (GAUZE/BANDAGES/DRESSINGS) ×2 IMPLANT
SUT VIC AB 0 CT2 27 (SUTURE) ×3 IMPLANT
SUT VIC AB 4-0 FS2 27 (SUTURE) ×3 IMPLANT
SWABSTK COMLB BENZOIN TINCTURE (MISCELLANEOUS) ×3 IMPLANT
TROCAR XCEL NON-BLD 11X100MML (ENDOMECHANICALS) ×3 IMPLANT
TUBING INSUFFLATION (TUBING) ×3 IMPLANT
WATER STERILE IRR 1000ML POUR (IV SOLUTION) ×3 IMPLANT

## 2017-08-06 NOTE — Anesthesia Preprocedure Evaluation (Signed)
Anesthesia Evaluation  Patient identified by MRN, date of birth, ID band Patient awake    Reviewed: Allergy & Precautions, NPO status , Patient's Chart, lab work & pertinent test results  History of Anesthesia Complications (+) history of anesthetic complications (pt with severe itching after c-section)  Airway Mallampati: II       Dental   Pulmonary asthma , neg sleep apnea, neg COPD,           Cardiovascular (-) hypertension(-) Past MI and (-) CHF (-) dysrhythmias (-) Valvular Problems/Murmurs     Neuro/Psych neg Seizures    GI/Hepatic Neg liver ROS, neg GERD  ,  Endo/Other  neg diabetes  Renal/GU negative Renal ROS     Musculoskeletal   Abdominal   Peds  Hematology  (+) anemia ,   Anesthesia Other Findings   Reproductive/Obstetrics                             Anesthesia Physical Anesthesia Plan  ASA: II  Anesthesia Plan: General   Post-op Pain Management:    Induction: Intravenous  PONV Risk Score and Plan: 3 and Ondansetron, Dexamethasone and Midazolam  Airway Management Planned: Oral ETT  Additional Equipment:   Intra-op Plan:   Post-operative Plan:   Informed Consent: I have reviewed the patients History and Physical, chart, labs and discussed the procedure including the risks, benefits and alternatives for the proposed anesthesia with the patient or authorized representative who has indicated his/her understanding and acceptance.     Plan Discussed with:   Anesthesia Plan Comments:         Anesthesia Quick Evaluation

## 2017-08-06 NOTE — Transfer of Care (Signed)
Immediate Anesthesia Transfer of Care Note  Patient: Anne Swanson  Procedure(s) Performed: LAPAROSCOPIC CHOLECYSTECTOMY WITH INTRAOPERATIVE CHOLANGIOGRAM (N/A )  Patient Location: PACU  Anesthesia Type:General  Level of Consciousness: drowsy  Airway & Oxygen Therapy: Patient Spontanous Breathing  Post-op Assessment: Report given to RN  Post vital signs: stable  Last Vitals:  Vitals Value Taken Time  BP 144/71 08/06/2017  8:30 AM  Temp    Pulse 86 08/06/2017  8:30 AM  Resp 14 08/06/2017  8:30 AM  SpO2 100 % 08/06/2017  8:30 AM  Vitals shown include unvalidated device data.  Last Pain:  Vitals:   08/06/17 0634  TempSrc: Tympanic  PainSc: 0-No pain         Complications: No apparent anesthesia complications

## 2017-08-06 NOTE — OR Nursing (Signed)
Discharge pending MD postop visit.

## 2017-08-06 NOTE — Discharge Instructions (Signed)

## 2017-08-06 NOTE — Anesthesia Procedure Notes (Signed)
Procedure Name: Intubation Date/Time: 08/06/2017 7:29 AM Performed by: Carron Curie, CRNA Pre-anesthesia Checklist: Patient identified, Patient being monitored, Timeout performed, Emergency Drugs available and Suction available Patient Re-evaluated:Patient Re-evaluated prior to induction Oxygen Delivery Method: Circle system utilized Preoxygenation: Pre-oxygenation with 100% oxygen Induction Type: IV induction Ventilation: Mask ventilation without difficulty Laryngoscope Size: Mac and 3 Grade View: Grade II Tube type: Oral Tube size: 7.0 mm Number of attempts: 1 Airway Equipment and Method: Stylet Placement Confirmation: ETT inserted through vocal cords under direct vision,  positive ETCO2 and breath sounds checked- equal and bilateral Secured at: 21 cm Tube secured with: Tape Dental Injury: Teeth and Oropharynx as per pre-operative assessment

## 2017-08-06 NOTE — H&P (Signed)
Anne Swanson [id number] 1960-09-24     HPI:  57 y/o woman with symptomatic cholecystitis. No episodes since initial episode.   Medications Prior to Admission  Medication Sig Dispense Refill Last Dose  . diphenhydrAMINE (SOMINEX) 25 MG tablet Take 25 mg by mouth 2 (two) times daily as needed for allergies.    07/30/2017  . VENTOLIN HFA 108 (90 Base) MCG/ACT inhaler USE 2 PUFFS EVERY 4 TO 6 HOURS AS NEEDED (Patient taking differently: USE 2 PUFFS EVERY 4 TO 6 HOURS AS NEEDED FOR SHORTNESS OF BREATH OR WHEEZING) 18 Inhaler 2 08/06/2017 at 0500  . montelukast (SINGULAIR) 10 MG tablet Take 1 tablet (10 mg total) by mouth at bedtime. (Patient not taking: Reported on 07/24/2017) 30 tablet 3 Not Taking at Unknown time   Allergies  Allergen Reactions  . Lodine [Etodolac] Other (See Comments)    Facial numbness   Past Medical History:  Diagnosis Date  . Anemia   . Asthma   . Complication of anesthesia    ITCHING X 30 HOURS AFTER SPINAL 1990  . Fibrocystic breast disease   . High cholesterol   . Migraines   . Osteopenia   . Vertigo    Past Surgical History:  Procedure Laterality Date  . CESAREAN SECTION     x3  . WISDOM TOOTH EXTRACTION     Social History   Socioeconomic History  . Marital status: Married    Spouse name: Not on file  . Number of children: 3  . Years of education: Not on file  . Highest education level: Not on file  Occupational History  . Occupation: Airline pilotAccountant    Comment: Self employed  Social Needs  . Financial resource strain: Not on file  . Food insecurity:    Worry: Not on file    Inability: Not on file  . Transportation needs:    Medical: Not on file    Non-medical: Not on file  Tobacco Use  . Smoking status: Never Smoker  . Smokeless tobacco: Never Used  Substance and Sexual Activity  . Alcohol use: Yes    Alcohol/week: 0.0 oz    Comment: once a month  . Drug use: No  . Sexual activity: Not on file  Lifestyle  . Physical activity:    Days  per week: Not on file    Minutes per session: Not on file  . Stress: Not on file  Relationships  . Social connections:    Talks on phone: Not on file    Gets together: Not on file    Attends religious service: Not on file    Active member of club or organization: Not on file    Attends meetings of clubs or organizations: Not on file    Relationship status: Not on file  . Intimate partner violence:    Fear of current or ex partner: Not on file    Emotionally abused: Not on file    Physically abused: Not on file    Forced sexual activity: Not on file  Other Topics Concern  . Not on file  Social History Narrative  . Not on file   Social History   Social History Narrative  . Not on file     ROS: Negative.     PE: HEENT: Negative. Lungs: Clear. Cardio: RR.   Assessment/Plan:  Proceed with planned cholecystectomy.  Merrily PewJeffrey W [facility]rnett 08/06/2017

## 2017-08-06 NOTE — Anesthesia Postprocedure Evaluation (Signed)
Anesthesia Post Note  Patient: Anne Swanson  Procedure(s) Performed: LAPAROSCOPIC CHOLECYSTECTOMY WITH INTRAOPERATIVE CHOLANGIOGRAM (N/A )  Patient location during evaluation: PACU Anesthesia Type: General Level of consciousness: awake and alert Pain management: pain level controlled Vital Signs Assessment: post-procedure vital signs reviewed and stable Respiratory status: spontaneous breathing and respiratory function stable Cardiovascular status: stable Anesthetic complications: no     Last Vitals:  Vitals:   08/06/17 0915 08/06/17 1033  BP: (!) 143/64 125/63  Pulse: 78 65  Resp: 14 14  Temp: (!) 36.1 C (!) 36.3 C  SpO2: 99% 99%    Last Pain:  Vitals:   08/06/17 1033  TempSrc: Temporal  PainSc: 3                  KEPHART,WILLIAM K

## 2017-08-06 NOTE — OR Nursing (Signed)
Dr. Byrnett in to see pt 1142 am 

## 2017-08-06 NOTE — Anesthesia Post-op Follow-up Note (Signed)
Anesthesia QCDR form completed.        

## 2017-08-06 NOTE — Op Note (Signed)
Preoperative diagnosis: Symptomatic cholelithiasis.  Postoperative diagnosis: Same.  Operative procedure: Laparoscopic cholecystectomy.  Operating Surgeon: Donnalee CurryJeffrey Byrnett, MD.  Anesthesia: General endotracheal.  Estimated blood loss: Less than 5 cc.  Clinical note: This 57 year old woman is had one episode of severe upper abdominal pain and ultrasound showed numerous gallstones.  She is felt to be a candidate for elective cholecystectomy.  Operative note: The patient underwent general endotracheal anesthesia without difficulty.  The abdomen was cleansed with ChloraPrep and draped in Trendelenburg position and a varies needle placed through a trans-umbilical incision.  After assuring the intra-abdominal location with a hanging drop test the abdomen was insufflated with CO2 of 10 mmHg pressure.  A 10 mm Step port was expanded and inspection showed no evidence of injury from initial port placement.  The patient was placed in reverse Trendelenburg position and rolled to the left.  Gallbladder was placed on cephalad traction.  The liver was normal.  The common bile duct was visualized.  The cystic duct and cystic artery were cleared.  Initial plans were for cholangiograms but this was not possible due to patient positioning on the table.  The critical view of safety was obtained and the cystic duct and cystic artery were doubly clipped and divided.  The cystic duct lymph node was removed separately.  The gallbladder was removed from the liver bed making use of hook cautery dissection.  It was delivered to the umbilical port site without incident.  Numerous 5 to 9 mm stones were retrieved.  After reestablishing pneumoperitoneum inspection from the epigastric site showed no evidence of injury from initial port placement.  The right upper quadrant was irrigated with lactated Ringer solution.  Good hemostasis was noted.  The abdomen was desufflated and ports removed under direct vision.  The fascia at the  umbilicus was closed with a 0 Vicryl figure-of-eight suture.  Skin incisions were closed with 4-0 Vicryl subcuticular sutures.  Benzoin, Steri-Strips, Telfa and Tegaderm dressings were applied.  The patient tolerated the procedure well and was taken to recovery room in stable condition.

## 2017-08-07 LAB — SURGICAL PATHOLOGY

## 2017-08-21 ENCOUNTER — Ambulatory Visit (INDEPENDENT_AMBULATORY_CARE_PROVIDER_SITE_OTHER): Payer: Managed Care, Other (non HMO) | Admitting: General Surgery

## 2017-08-21 ENCOUNTER — Ambulatory Visit: Payer: Managed Care, Other (non HMO) | Admitting: General Surgery

## 2017-08-21 ENCOUNTER — Encounter: Payer: Self-pay | Admitting: General Surgery

## 2017-08-21 VITALS — BP 130/70 | HR 80 | Resp 12 | Ht 63.0 in | Wt 134.0 lb

## 2017-08-21 DIAGNOSIS — K8020 Calculus of gallbladder without cholecystitis without obstruction: Secondary | ICD-10-CM

## 2017-08-21 NOTE — Progress Notes (Signed)
Patient ID: Anne Swanson, female   DOB: Nov 16, 1960, 57 y.o.   MRN: [id number]  Chief Complaint  Patient presents with  . Routine Post Op    HPI Anne Swanson is a 57 y.o. female here today for her post op cholecystectomy done on 08/06/2017. Patient states she is doing well.  The patient reports she moves her bowels about every other day.  No diarrhea.  She required no narcotic analgesics after surgery. HPI  Past Medical History:  Diagnosis Date  . Anemia   . Asthma   . Complication of anesthesia    ITCHING X 30 HOURS AFTER SPINAL 1990  . Fibrocystic breast disease   . High cholesterol   . Migraines   . Osteopenia   . Vertigo     Past Surgical History:  Procedure Laterality Date  . CESAREAN SECTION     x3  . CHOLECYSTECTOMY N/A 08/06/2017   Procedure: LAPAROSCOPIC CHOLECYSTECTOMY WITH INTRAOPERATIVE CHOLANGIOGRAM;  Surgeon: Earline Mayotte, MD;  Location: [facility] ORS;  Service: General;  Laterality: N/A;  . WISDOM TOOTH EXTRACTION      Family History  Problem Relation Age of Onset  . Colon cancer Mother   . Breast cancer Mother   . Coronary artery disease Father   . Schizophrenia Father   . Leukemia Brother   . Stomach cancer Maternal Uncle   . Breast cancer Other     Social History Social History   Tobacco Use  . Smoking status: Never Smoker  . Smokeless tobacco: Never Used  Substance Use Topics  . Alcohol use: Yes    Alcohol/week: 0.0 oz    Comment: once a month  . Drug use: No    Allergies  Allergen Reactions  . Lodine [Etodolac] Other (See Comments)    Facial numbness    Current Outpatient Medications  Medication Sig Dispense Refill  . diphenhydrAMINE (SOMINEX) 25 MG tablet Take 25 mg by mouth 2 (two) times daily as needed for allergies.     . montelukast (SINGULAIR) 10 MG tablet Take 1 tablet (10 mg total) by mouth at bedtime. 30 tablet 3  . VENTOLIN HFA 108 (90 Base) MCG/ACT inhaler USE 2 PUFFS EVERY 4 TO 6 HOURS AS NEEDED (Patient taking  differently: USE 2 PUFFS EVERY 4 TO 6 HOURS AS NEEDED FOR SHORTNESS OF BREATH OR WHEEZING) 18 Inhaler 2   No current facility-administered medications for this visit.     Review of Systems Review of Systems  Constitutional: Negative.   Respiratory: Negative.   Cardiovascular: Negative.     Blood pressure 130/70, pulse 80, resp. rate 12, height  (1.6 m), weight 134 lb (60.8 kg), last menstrual period 02/15/2014.  Physical Exam Physical Exam  Constitutional: She is oriented to person, place, and time. She appears well-developed and well-nourished.  Cardiovascular: Normal rate, regular rhythm and normal heart sounds.  Pulmonary/Chest: Effort normal and breath sounds normal.  Abdominal:    Umbilical and lateral port sites healing well without bruising.  Neurological: She is alert and oriented to person, place, and time.  Skin: Skin is warm and dry.    Data Reviewed DIAGNOSIS:  A. GALLBLADDER; CHOLECYSTECTOMY:  - CHRONIC CHOLECYSTITIS WITH CHOLELITHIASIS.  - NEGATIVE FOR MALIGNANCY.   Assessment    Doing well post cholecystectomy.    Plan Patient to return as needed. The patient is aware to call back for any questions or concerns.   HPI, Physical Exam, Assessment and Plan have been scribed under the direction and  in the presence of Donnalee Curry, MD.  Ples Specter, CMA   Merrily Pew Blakeley Scheier 08/21/2017, 5:27 PM

## 2017-08-21 NOTE — Patient Instructions (Addendum)
The patient is aware to call back for any questions or concerns. Return as needed.  

## 2018-05-04 NOTE — Telephone Encounter (Signed)
error 

## 2019-06-27 ENCOUNTER — Ambulatory Visit: Payer: Managed Care, Other (non HMO) | Attending: Internal Medicine

## 2019-06-27 DIAGNOSIS — Z23 Encounter for immunization: Secondary | ICD-10-CM

## 2019-06-27 NOTE — Progress Notes (Signed)
   Covid-19 Vaccination Clinic  Name:  Anne Swanson    MRN: [id number] DOB: 11/30/1960  06/27/2019  [name] was observed post Covid-19 immunization for 15 minutes without incident. She was provided with Vaccine Information Sheet and instruction to access the V-Safe system.   [name] was instructed to call 911 with any severe reactions post vaccine: Marland Kitchen Difficulty breathing  . Swelling of face and throat  . A fast heartbeat  . A bad rash all over body  . Dizziness and weakness   Immunizations Administered    Name Date Dose VIS Date Route   Pfizer COVID-19 Vaccine 06/27/2019 10:41 AM 0.3 mL 03/28/2019 Intramuscular   Manufacturer: [employer], [employer]   Lot: [id number]   NDC: [address]-3

## 2019-07-22 ENCOUNTER — Ambulatory Visit: Payer: Managed Care, Other (non HMO) | Attending: Internal Medicine

## 2019-07-22 DIAGNOSIS — Z23 Encounter for immunization: Secondary | ICD-10-CM

## 2019-07-22 NOTE — Progress Notes (Signed)
   Covid-19 Vaccination Clinic  Name:  Anne Swanson    MRN: [id number] DOB: [birth year]  07/22/2019  Anne Swanson was observed post Covid-19 immunization for 15 minutes without incident. She was provided with Vaccine Information Sheet and instruction to access the V-Safe system.   Anne Swanson was instructed to call 911 with any severe reactions post vaccine: Marland Kitchen Difficulty breathing  . Swelling of face and throat  . A fast heartbeat  . A bad rash all over body  . Dizziness and weakness   Immunizations Administered    Name Date Dose VIS Date Route   Pfizer COVID-19 Vaccine 07/22/2019 10:39 AM 0.3 mL 03/28/2019 Intramuscular   Manufacturer: [employer], [employer]   Lot: [id number]   NDC: [address]-5

## 2019-12-11 ENCOUNTER — Other Ambulatory Visit: Payer: Self-pay

## 2019-12-11 ENCOUNTER — Telehealth (INDEPENDENT_AMBULATORY_CARE_PROVIDER_SITE_OTHER): Payer: Managed Care, Other (non HMO) | Admitting: Family Medicine

## 2019-12-11 ENCOUNTER — Ambulatory Visit
Admission: RE | Admit: 2019-12-11 | Discharge: 2019-12-11 | Disposition: A | Discharge status: Home / Self Care | Payer: Managed Care, Other (non HMO) | Source: Ambulatory Visit | Attending: Family Medicine | Admitting: Family Medicine

## 2019-12-11 ENCOUNTER — Other Ambulatory Visit: Payer: Self-pay | Admitting: Family Medicine

## 2019-12-11 ENCOUNTER — Ambulatory Visit
Admission: RE | Admit: 2019-12-11 | Discharge: 2019-12-11 | Disposition: A | Discharge status: Home / Self Care | Payer: Managed Care, Other (non HMO) | Attending: Family Medicine | Admitting: Family Medicine

## 2019-12-11 DIAGNOSIS — R0602 Shortness of breath: Secondary | ICD-10-CM

## 2019-12-11 DIAGNOSIS — U071 COVID-19: Secondary | ICD-10-CM

## 2019-12-11 DIAGNOSIS — J45909 Unspecified asthma, uncomplicated: Secondary | ICD-10-CM

## 2019-12-11 NOTE — Progress Notes (Signed)
MyChart Video Visit    Virtual Visit via Video Note   This visit type was conducted due to national recommendations for restrictions regarding the COVID-19 Pandemic (e.g. social distancing) in an effort to limit this patient's exposure and mitigate transmission in our community. This patient is at least at moderate risk for complications without adequate follow up. This format is felt to be most appropriate for this patient at this time. Physical exam was limited by quality of the video and audio technology used for the visit.    Patient location: home Provider location: [facility] Persons involved in the visit: patient, provider  I discussed the limitations of evaluation and management by telemedicine and the availability of in person appointments. The patient expressed understanding and agreed to proceed.    Patient: Anne Swanson   DOB: 1960-10-25   59 y.o. Female  MRN: [id number] Visit Date: 12/11/2019  Today's healthcare provider: Shirlee Latch, MD   Chief Complaint  Patient presents with  . Shortness of Breath   Subjective    HPI Patient C/O chest tightness, cough, and shortness of breath. Patient reports she tested positive for COVID 19 about a week ago. Symptoms present for 12 days (since 8/15).  Patient is requesting to get a chest x-ray. Patient denies any fever, chills, ear pain and sore throat. OVerall feeling better but chest tightness persists. Similar to asthma flares in past.  Gettign some relief from albuterol. +Vaccinated against COVID19.  Patient Active Problem List   Diagnosis Date Noted  . Calculus of gallbladder without cholecystitis without obstruction 06/29/2017  . Hyperlipidemia 03/01/2015  . Hematuria 12/02/2014  . 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 Fatigue 11/16/2014  . Fibrocystic breast disease 11/16/2014  . Anemia, iron deficiency 11/16/2014  . Osteopenia 11/16/2014  . Recurrent UTI 11/16/2014  . Closed rib fracture  11/16/2014  . Vitamin D deficiency 11/16/2014  . Dizziness 11/16/2014  . Closed fracture of one rib 11/16/2014   Social History   Tobacco Use  . Smoking status: Never Smoker  . Smokeless tobacco: Never Used  Vaping Use  . Vaping Use: Never used  Substance Use Topics  . Alcohol use: Yes    Alcohol/week: 0.0 standard drinks    Comment: once a month  . Drug use: No   Allergies  Allergen Reactions  . Lodine [Etodolac] Other (See Comments)    Facial numbness      Medications: Outpatient Medications Prior to Visit  Medication Sig  . VENTOLIN HFA 108 (90 Base) MCG/ACT inhaler USE 2 PUFFS EVERY 4 TO 6 HOURS AS NEEDED (Patient taking differently: USE 2 PUFFS EVERY 4 TO 6 HOURS AS NEEDED FOR SHORTNESS OF BREATH OR WHEEZING)  . [DISCONTINUED] diphenhydrAMINE (SOMINEX) 25 MG tablet Take 25 mg by mouth 2 (two) times daily as needed for allergies.  (Patient not taking: Reported on 12/11/2019)  . [DISCONTINUED] montelukast (SINGULAIR) 10 MG tablet Take 1 tablet (10 mg total) by mouth at bedtime. (Patient not taking: Reported on 12/11/2019)   No facility-administered medications prior to visit.    Review of Systems  HENT: Positive for postnasal drip. Negative for congestion, ear pain and sore throat.   Respiratory: Positive for cough, chest tightness, shortness of breath and wheezing.   Neurological: Positive for headaches.      Objective    LMP 02/15/2014    Physical Exam Constitutional:      General: She is not in acute distress.    Appearance: She is well-developed. She is  not diaphoretic.  HENT:     Head: Normocephalic and atraumatic.  Pulmonary:     Effort: Pulmonary effort is normal. No tachypnea or respiratory distress.  Neurological:     Mental Status: She is alert and oriented to person, place, and time.  Psychiatric:        Behavior: Behavior normal.        Assessment & Plan     1. COVID-19 2. Shortness of breath - COVID symptoms x12 days with positive test  1 week ago - outside of treatment window for monoclonal antibodies - given ongoing chest tightness and SOB, will obtain CXR - if pneumonia present, consider Azithromycin - if no pneumonia, treat with steroids for possible asthma exacerbation  - continue OTC symptomatic treatment - discussed return precautions - DG Chest 2 View; Future    Return if symptoms worsen or fail to improve.     I discussed the assessment and treatment plan with the patient. The patient was provided an opportunity to ask questions and all were answered. The patient agreed with the plan and demonstrated an understanding of the instructions.   The patient was advised to call back or seek an in-person evaluation if the symptoms worsen or if the condition fails to improve as anticipated.  I, Shirlee Latch, MD, have reviewed all documentation for this visit. The documentation on 12/11/19 for the exam, diagnosis, procedures, and orders are all accurate and complete.   Frazier Balfour, Marzella Schlein, MD, MPH [facility]

## 2019-12-11 NOTE — Telephone Encounter (Signed)
Requested medication (s) are due for refill today: no  Requested medication (s) are on the active medication list: yes  Last refill: 08/02/2015  Future visit scheduled: no  Notes to clinic: script is expired  Review for refill   Requested Prescriptions  Pending Prescriptions Disp Refills   VENTOLIN HFA 108 (90 Base) MCG/ACT inhaler [Pharmacy Med Name: VENTOLIN HFA 90 MCG INHALER]  1    Sig: TAKE 2 PUFFS BY MOUTH EVERY 6 HOURS AS NEEDED FOR WHEEZE      Pulmonology:  Beta Agonists Failed - 12/11/2019  1:17 PM      Failed - One inhaler should last at least one month. If the patient is requesting refills earlier, contact the patient to check for uncontrolled symptoms.      Passed - Valid encounter within last 12 months    Recent Outpatient Visits           Today COVID-19   [facility] [place], Marzella Schlein, MD   2 years ago Upper abdominal pain   [facility] Malva Limes, MD   2 years ago Cough   [facility] Malva Limes, MD   2 years ago Annual physical exam   [facility] Malva Limes, MD   3 years ago Vitamin D deficiency   [facility] Sherrie Mustache, Demetrios Isaacs, MD

## 2019-12-11 NOTE — Patient Instructions (Signed)
COVID-19 COVID-19 is a respiratory infection that is caused by a virus called severe acute respiratory syndrome coronavirus 2 (SARS-CoV-2). The disease is also known as coronavirus disease or novel coronavirus. In some people, the virus may not cause any symptoms. In others, it may cause a serious infection. The infection can get worse quickly and can lead to complications, such as:  Pneumonia, or infection of the lungs.  Acute respiratory distress syndrome or ARDS. This is a condition in which fluid build-up in the lungs prevents the lungs from filling with air and passing oxygen into the blood.  Acute respiratory failure. This is a condition in which there is not enough oxygen passing from the lungs to the body or when carbon dioxide is not passing from the lungs out of the body.  Sepsis or septic shock. This is a serious bodily reaction to an infection.  Blood clotting problems.  Secondary infections due to bacteria or fungus.  Organ failure. This is when your body's organs stop working. The virus that causes COVID-19 is contagious. This means that it can spread from person to person through droplets from coughs and sneezes (respiratory secretions). What are the causes? This illness is caused by a virus. You may catch the virus by:  Breathing in droplets from an infected person. Droplets can be spread by a person breathing, speaking, singing, coughing, or sneezing.  Touching something, like a table or a doorknob, that was exposed to the virus (contaminated) and then touching your mouth, nose, or eyes. What increases the risk? Risk for infection You are more likely to be infected with this virus if you:  Are within 6 feet (2 meters) of a person with COVID-19.  Provide care for or live with a person who is infected with COVID-19.  Spend time in crowded indoor spaces or live in shared housing. Risk for serious illness You are more likely to become seriously ill from the virus if you:   Are 59 years of age or older. The higher your age, the more you are at risk for serious illness.  Live in a nursing home or long-term care facility.  Have cancer.  Have a long-term (chronic) disease such as: ? Chronic lung disease, including chronic obstructive pulmonary disease or asthma. ? A long-term disease that lowers your body's ability to fight infection (immunocompromised). ? Heart disease, including heart failure, a condition in which the arteries that lead to the heart become narrow or blocked (coronary artery disease), a disease which makes the heart muscle thick, weak, or stiff (cardiomyopathy). ? Diabetes. ? Chronic kidney disease. ? Sickle cell disease, a condition in which red blood cells have an abnormal "sickle" shape. ? Liver disease.  Are obese. What are the signs or symptoms? Symptoms of this condition can range from mild to severe. Symptoms may appear any time from 2 to 14 days after being exposed to the virus. They include:  A fever or chills.  A cough.  Difficulty breathing.  Headaches, body aches, or muscle aches.  Runny or stuffy (congested) nose.  A sore throat.  New loss of taste or smell. Some people may also have stomach problems, such as nausea, vomiting, or diarrhea. Other people may not have any symptoms of COVID-19. How is this diagnosed? This condition may be diagnosed based on:  Your signs and symptoms, especially if: ? You live in an area with a COVID-19 outbreak. ? You recently traveled to or from an area where the virus is common. ? You   provide care for or live with a person who was diagnosed with COVID-19. ? You were exposed to a person who was diagnosed with COVID-19.  A physical exam.  Lab tests, which may include: ? Taking a sample of fluid from the back of your nose and throat (nasopharyngeal fluid), your nose, or your throat using a swab. ? A sample of mucus from your lungs (sputum). ? Blood tests.  Imaging tests, which  may include, X-rays, CT scan, or ultrasound. How is this treated? At present, there is no medicine to treat COVID-19. Medicines that treat other diseases are being used on a trial basis to see if they are effective against COVID-19. Your health care provider will talk with you about ways to treat your symptoms. For most people, the infection is mild and can be managed at home with rest, fluids, and over-the-counter medicines. Treatment for a serious infection usually takes places in a hospital intensive care unit (ICU). It may include one or more of the following treatments. These treatments are given until your symptoms improve.  Receiving fluids and medicines through an IV.  Supplemental oxygen. Extra oxygen is given through a tube in the nose, a face mask, or a hood.  Positioning you to lie on your stomach (prone position). This makes it easier for oxygen to get into the lungs.  Continuous positive airway pressure (CPAP) or bi-level positive airway pressure (BPAP) machine. This treatment uses mild air pressure to keep the airways open. A tube that is connected to a motor delivers oxygen to the body.  Ventilator. This treatment moves air into and out of the lungs by using a tube that is placed in your windpipe.  Tracheostomy. This is a procedure to create a hole in the neck so that a breathing tube can be inserted.  Extracorporeal membrane oxygenation (ECMO). This procedure gives the lungs a chance to recover by taking over the functions of the heart and lungs. It supplies oxygen to the body and removes carbon dioxide. Follow these instructions at home: Lifestyle  If you are sick, stay home except to get medical care. Your health care provider will tell you how long to stay home. Call your health care provider before you go for medical care.  Rest at home as told by your health care provider.  Do not use any products that contain nicotine or tobacco, such as cigarettes, e-cigarettes, and  chewing tobacco. If you need help quitting, ask your health care provider.  Return to your normal activities as told by your health care provider. Ask your health care provider what activities are safe for you. General instructions  Take over-the-counter and prescription medicines only as told by your health care provider.  Drink enough fluid to keep your urine pale yellow.  Keep all follow-up visits as told by your health care provider. This is important. How is this prevented?  There is no vaccine to help prevent COVID-19 infection. However, there are steps you can take to protect yourself and others from this virus. To protect yourself:   Do not travel to areas where COVID-19 is a risk. The areas where COVID-19 is reported change often. To identify high-risk areas and travel restrictions, check the CDC travel website: wwwnc.cdc.gov/travel/notices  If you live in, or must travel to, an area where COVID-19 is a risk, take precautions to avoid infection. ? Stay away from people who are sick. ? Wash your hands often with soap and water for 20 seconds. If soap and water   are not available, use an alcohol-based hand sanitizer. ? Avoid touching your mouth, face, eyes, or nose. ? Avoid going out in public, follow guidance from your state and local health authorities. ? If you must go out in public, wear a cloth face covering or face mask. Make sure your mask covers your nose and mouth. ? Avoid crowded indoor spaces. Stay at least 6 feet (2 meters) away from others. ? Disinfect objects and surfaces that are frequently touched every day. This may include:  Counters and tables.  Doorknobs and light switches.  Sinks and faucets.  Electronics, such as phones, remote controls, keyboards, computers, and tablets. To protect others: If you have symptoms of COVID-19, take steps to prevent the virus from spreading to others.  If you think you have a COVID-19 infection, contact your health care  provider right away. Tell your health care team that you think you may have a COVID-19 infection.  Stay home. Leave your house only to seek medical care. Do not use public transport.  Do not travel while you are sick.  Wash your hands often with soap and water for 20 seconds. If soap and water are not available, use alcohol-based hand sanitizer.  Stay away from other members of your household. Let healthy household members care for children and pets, if possible. If you have to care for children or pets, wash your hands often and wear a mask. If possible, stay in your own room, separate from others. Use a different bathroom.  Make sure that all people in your household wash their hands well and often.  Cough or sneeze into a tissue or your sleeve or elbow. Do not cough or sneeze into your hand or into the air.  Wear a cloth face covering or face mask. Make sure your mask covers your nose and mouth. Where to find more information  Centers for Disease Control and Prevention: www.cdc.gov/coronavirus/2019-ncov/index.html  World Health Organization: www.who.int/health-topics/coronavirus Contact a health care provider if:  You live in or have traveled to an area where COVID-19 is a risk and you have symptoms of the infection.  You have had contact with someone who has COVID-19 and you have symptoms of the infection. Get help right away if:  You have trouble breathing.  You have pain or pressure in your chest.  You have confusion.  You have bluish lips and fingernails.  You have difficulty waking from sleep.  You have symptoms that get worse. These symptoms may represent a serious problem that is an emergency. Do not wait to see if the symptoms will go away. Get medical help right away. Call your local emergency services (911 in the [place] Do not drive yourself to the hospital. Let the emergency medical personnel know if you think you have COVID-19. Summary  COVID-19 is a  respiratory infection that is caused by a virus. It is also known as coronavirus disease or novel coronavirus. It can cause serious infections, such as pneumonia, acute respiratory distress syndrome, acute respiratory failure, or sepsis.  The virus that causes COVID-19 is contagious. This means that it can spread from person to person through droplets from breathing, speaking, singing, coughing, or sneezing.  You are more likely to develop a serious illness if you are 50 years of age or older, have a weak immune system, live in a nursing home, or have chronic disease.  There is no medicine to treat COVID-19. Your health care provider will talk with you about ways to treat your symptoms.    Take steps to protect yourself and others from infection. Wash your hands often and disinfect objects and surfaces that are frequently touched every day. Stay away from people who are sick and wear a mask if you are sick. This information is not intended to replace advice given to you by your health care provider. Make sure you discuss any questions you have with your health care provider. Document Revised: 01/31/2019 Document Reviewed: 05/09/2018 Elsevier Patient Education  2020 [employer]  

## 2019-12-12 ENCOUNTER — Telehealth: Payer: Self-pay | Admitting: *Deleted

## 2019-12-12 ENCOUNTER — Encounter: Payer: Self-pay | Admitting: Family Medicine

## 2019-12-12 DIAGNOSIS — R0602 Shortness of breath: Secondary | ICD-10-CM

## 2019-12-12 MED ORDER — PREDNISONE 20 MG PO TABS: 40.0000 mg | ORAL_TABLET | Freq: Every day | ORAL | 0 refills | Status: AC

## 2019-12-12 NOTE — Telephone Encounter (Signed)
Copied from CRM [id number]. Topic: General - Other >> Dec 12, 2019  9:01 AM Jaquita Rector A wrote: Reason for CRM: Patient called to inquire of Dr B about sending the Rx discussed for Prednisone to the pharmacy for her. Can be reached at Ph# [phone]

## 2019-12-12 NOTE — Telephone Encounter (Signed)
-----   Message from Erasmo Downer, MD sent at 12/12/2019  8:01 AM EDT ----- No pneumonia or other acute findings.  Can treat chest tightness/shortness of breath like asthma exacerbation with prednisone.  Ok to ERx Prednisone 40mg  daily x7d r0 if patient agrees.

## 2019-12-12 NOTE — Telephone Encounter (Signed)
Please review. Thanks!  

## 2019-12-12 NOTE — Telephone Encounter (Signed)
Advised patient as below. Medication was sent into the pharmacy.  

## 2019-12-12 NOTE — Telephone Encounter (Signed)
See result note with directions for prescription to be sent

## 2019-12-31 NOTE — Progress Notes (Signed)
MyChart Video Visit    Virtual Visit via Video Note   This visit type was conducted due to national recommendations for restrictions regarding the COVID-19 Pandemic (e.g. social distancing) in an effort to limit this patient's exposure and mitigate transmission in our community. This patient is at least at moderate risk for complications without adequate follow up. This format is felt to be most appropriate for this patient at this time. Physical exam was limited by quality of the video and audio technology used for the visit.    Patient location: home Provider location: [facility] Persons involved in the visit: patient, provider  I discussed the limitations of evaluation and management by telemedicine and the availability of in person appointments. The patient expressed understanding and agreed to proceed.  Patient: Anne Swanson   DOB: 1960/07/15   59 y.o. Female  MRN: [id number] Visit Date: 01/01/2020  Today's healthcare provider: Shirlee Latch, MD   Chief Complaint  Patient presents with  . Follow-up   Subjective    HPI  Follow up for COVID 19  The patient was last seen for this 3 weeks ago. Changes made at last visit include chest x ray, start prednisone x 7 days.  She reports excellent compliance with treatment. She feels that condition is Unchanged. Patient reports she still has a "nagging" cough, and chest tightness all day long. Patient denies any shortness of breath or wheezing. Patient reports she is using inhaler twice a day every other day. She is not having side effects.   -----------------------------------------------------------------------------------------  Patient Active Problem List   Diagnosis Date Noted  . Calculus of gallbladder without cholecystitis without obstruction 06/29/2017  . Hyperlipidemia 03/01/2015  . Hematuria 12/02/2014  . Asthma 11/16/2014  . Fatigue 11/16/2014  . Fibrocystic breast disease 11/16/2014  .  Anemia, iron deficiency 11/16/2014  . Osteopenia 11/16/2014  . Recurrent UTI 11/16/2014  . Closed rib fracture 11/16/2014  . Vitamin D deficiency 11/16/2014  . Dizziness 11/16/2014  . Closed fracture of one rib 11/16/2014   Social History   Tobacco Use  . Smoking status: Never Smoker  . Smokeless tobacco: Never Used  Vaping Use  . Vaping Use: Never used  Substance Use Topics  . Alcohol use: Yes    Alcohol/week: 0.0 standard drinks    Comment: once a month  . Drug use: No   Allergies  Allergen Reactions  . Lodine [Etodolac] Other (See Comments)    Facial numbness      Medications: Outpatient Medications Prior to Visit  Medication Sig  . VENTOLIN HFA 108 (90 Base) MCG/ACT inhaler TAKE 2 PUFFS BY MOUTH EVERY 6 HOURS AS NEEDED FOR WHEEZE   No facility-administered medications prior to visit.    Review of Systems  Constitutional: Negative for chills and fever.  HENT: Positive for rhinorrhea. Negative for ear pain, sinus pressure, sinus pain and sore throat.   Respiratory: Positive for cough and chest tightness. Negative for shortness of breath and wheezing.   Cardiovascular: Negative for chest pain and palpitations.      Objective    Pulse 82   Ht 5\' 2"  (1.575 m)   Wt 118 lb (53.5 kg)   LMP 02/15/2014   SpO2 98%   BMI 21.58 kg/m  BP Readings from Last 3 Encounters:  08/21/17 130/70  08/06/17 125/63  06/28/17 122/72   Wt Readings from Last 3 Encounters:  01/01/20 118 lb (53.5 kg)  08/21/17 134 lb (60.8 kg)  07/30/17 134 lb (60.8  kg)      Physical Exam Constitutional:      General: She is not in acute distress.    Appearance: Normal appearance. She is not diaphoretic.  HENT:     Head: Normocephalic and atraumatic.  Eyes:     Conjunctiva/sclera: Conjunctivae normal.  Pulmonary:     Effort: Pulmonary effort is normal. No respiratory distress.  Neurological:     Mental Status: She is alert and oriented to person, place, and time. Mental status is at  baseline.  Psychiatric:        Mood and Affect: Mood normal.        Behavior: Behavior normal.        Assessment & Plan     Problem List Items Addressed This Visit      Respiratory   Asthma - Primary    Now with moderate persistent symptoms Does have improvement with albuterol, but needing it regularly Needs controller medication Has never taken before Start Breo daily Continue albuterol as needed Discussed return precautions It is possible that controller medication may only need to be short-term in this post Covid phase, but time will tell      Relevant Medications   fluticasone furoate-vilanterol (BREO ELLIPTA) 100-25 MCG/INH AEPB    Other Visit Diagnoses    Vaccine counseling         - discussed that myalgias, fatigue, and slight fever are normal after vaccines as an immune response - she is due for tetanus, flu, and pneumovax (high risk due to asthma) - we will schedule these vaccines at her convenience   Return in about 3 months (around 04/01/2020) for CPE.     I discussed the assessment and treatment plan with the patient. The patient was provided an opportunity to ask questions and all were answered. The patient agreed with the plan and demonstrated an understanding of the instructions.   The patient was advised to call back or seek an in-person evaluation if the symptoms worsen or if the condition fails to improve as anticipated.   I, Shirlee Latch, MD, have reviewed all documentation for this visit. The documentation on 01/01/20 for the exam, diagnosis, procedures, and orders are all accurate and complete.   Nevaeh Casillas, Marzella Schlein, MD, MPH [facility]

## 2020-01-01 ENCOUNTER — Encounter: Payer: Self-pay | Admitting: Family Medicine

## 2020-01-01 ENCOUNTER — Telehealth (INDEPENDENT_AMBULATORY_CARE_PROVIDER_SITE_OTHER): Payer: Managed Care, Other (non HMO) | Admitting: Family Medicine

## 2020-01-01 VITALS — HR 82 | Ht 62.0 in | Wt 118.0 lb

## 2020-01-01 DIAGNOSIS — Z7185 Encounter for immunization safety counseling: Secondary | ICD-10-CM

## 2020-01-01 DIAGNOSIS — J4540 Moderate persistent asthma, uncomplicated: Secondary | ICD-10-CM

## 2020-01-01 DIAGNOSIS — Z7189 Other specified counseling: Secondary | ICD-10-CM

## 2020-01-01 MED ORDER — FLUTICASONE FUROATE-VILANTEROL 100-25 MCG/INH IN AEPB: 1.0000 | INHALATION_SPRAY | Freq: Every day | RESPIRATORY_TRACT | 3 refills | Status: DC

## 2020-01-01 NOTE — Assessment & Plan Note (Signed)
Now with moderate persistent symptoms Does have improvement with albuterol, but needing it regularly Needs controller medication Has never taken before Start Breo daily Continue albuterol as needed Discussed return precautions It is possible that controller medication may only need to be short-term in this post Covid phase, but time will tell

## 2020-01-05 ENCOUNTER — Other Ambulatory Visit: Payer: Self-pay

## 2020-01-05 ENCOUNTER — Encounter: Payer: Self-pay | Admitting: Family Medicine

## 2020-01-05 ENCOUNTER — Ambulatory Visit (INDEPENDENT_AMBULATORY_CARE_PROVIDER_SITE_OTHER): Payer: Managed Care, Other (non HMO) | Admitting: Family Medicine

## 2020-01-05 VITALS — Temp 98.0°F

## 2020-01-05 DIAGNOSIS — Z23 Encounter for immunization: Secondary | ICD-10-CM | POA: Diagnosis not present

## 2020-01-05 DIAGNOSIS — J4540 Moderate persistent asthma, uncomplicated: Secondary | ICD-10-CM | POA: Diagnosis not present

## 2020-01-05 NOTE — Progress Notes (Signed)
Patient here for flu, pneumovax, tetanus vaccination only.  I did not examine the patient.  I did review his medical history, medications, and allergies and vaccine consent form.  CMA gave vaccination. Patient tolerated well.  Erasmo Downer, MD, MPH [facility] 01/05/2020 9:10 AM

## 2020-02-03 ENCOUNTER — Other Ambulatory Visit: Payer: Self-pay

## 2020-02-03 ENCOUNTER — Ambulatory Visit (INDEPENDENT_AMBULATORY_CARE_PROVIDER_SITE_OTHER): Payer: Managed Care, Other (non HMO) | Admitting: Family Medicine

## 2020-02-03 ENCOUNTER — Encounter: Payer: Self-pay | Admitting: Family Medicine

## 2020-02-03 VITALS — BP 120/73 | HR 76 | Temp 98.2°F | Ht 62.0 in | Wt 122.0 lb

## 2020-02-03 DIAGNOSIS — Z114 Encounter for screening for human immunodeficiency virus [HIV]: Secondary | ICD-10-CM

## 2020-02-03 DIAGNOSIS — E559 Vitamin D deficiency, unspecified: Secondary | ICD-10-CM

## 2020-02-03 DIAGNOSIS — Z0000 Encounter for general adult medical examination without abnormal findings: Secondary | ICD-10-CM | POA: Diagnosis not present

## 2020-02-03 DIAGNOSIS — Z1231 Encounter for screening mammogram for malignant neoplasm of breast: Secondary | ICD-10-CM

## 2020-02-03 DIAGNOSIS — Z1211 Encounter for screening for malignant neoplasm of colon: Secondary | ICD-10-CM

## 2020-02-03 DIAGNOSIS — M8580 Other specified disorders of bone density and structure, unspecified site: Secondary | ICD-10-CM

## 2020-02-03 DIAGNOSIS — E782 Mixed hyperlipidemia: Secondary | ICD-10-CM

## 2020-02-03 DIAGNOSIS — J4540 Moderate persistent asthma, uncomplicated: Secondary | ICD-10-CM | POA: Diagnosis not present

## 2020-02-03 DIAGNOSIS — Z862 Personal history of diseases of the blood and blood-forming organs and certain disorders involving the immune mechanism: Secondary | ICD-10-CM

## 2020-02-03 NOTE — Patient Instructions (Addendum)
The CDC recommends two doses of Shingrix (the shingles vaccine) separated by 2 to 6 months for adults age 59 years and older. I recommend checking with your insurance plan regarding coverage for this vaccine.      Preventive Care 35-87 Years Old, Female Preventive care refers to visits with your health care provider and lifestyle choices that can promote health and wellness. This includes:  A yearly physical exam. This may also be called an annual well check.  Regular dental visits and eye exams.  Immunizations.  Screening for certain conditions.  Healthy lifestyle choices, such as eating a healthy diet, getting regular exercise, not using drugs or products that contain nicotine and tobacco, and limiting alcohol use. What can I expect for my preventive care visit? Physical exam Your health care provider will check your:  Height and weight. This may be used to calculate body mass index (BMI), which tells if you are at a healthy weight.  Heart rate and blood pressure.  Skin for abnormal spots. Counseling Your health care provider may ask you questions about your:  Alcohol, tobacco, and drug use.  Emotional well-being.  Home and relationship well-being.  Sexual activity.  Eating habits.  Work and work Statistician.  Method of birth control.  Menstrual cycle.  Pregnancy history. What immunizations do I need?  Influenza (flu) vaccine  This is recommended every year. Tetanus, diphtheria, and pertussis (Tdap) vaccine  You may need a Td booster every 10 years. Varicella (chickenpox) vaccine  You may need this if you have not been vaccinated. Zoster (shingles) vaccine  You may need this after age 72. Measles, mumps, and rubella (MMR) vaccine  You may need at least one dose of MMR if you were born in 1957 or later. You may also need a second dose. Pneumococcal conjugate (PCV13) vaccine  You may need this if you have certain conditions and were not previously  vaccinated. Pneumococcal polysaccharide (PPSV23) vaccine  You may need one or two doses if you smoke cigarettes or if you have certain conditions. Meningococcal conjugate (MenACWY) vaccine  You may need this if you have certain conditions. Hepatitis A vaccine  You may need this if you have certain conditions or if you travel or work in places where you may be exposed to hepatitis A. Hepatitis B vaccine  You may need this if you have certain conditions or if you travel or work in places where you may be exposed to hepatitis B. Haemophilus influenzae type b (Hib) vaccine  You may need this if you have certain conditions. Human papillomavirus (HPV) vaccine  If recommended by your health care provider, you may need three doses over 6 months. You may receive vaccines as individual doses or as more than one vaccine together in one shot (combination vaccines). Talk with your health care provider about the risks and benefits of combination vaccines. What tests do I need? Blood tests  Lipid and cholesterol levels. These may be checked every 5 years, or more frequently if you are over 48 years old.  Hepatitis C test.  Hepatitis B test. Screening  Lung cancer screening. You may have this screening every year starting at age 29 if you have a 30-pack-year history of smoking and currently smoke or have quit within the past 15 years.  Colorectal cancer screening. All adults should have this screening starting at age 71 and continuing until age 51. Your health care provider may recommend screening at age 51 if you are at increased risk. You  will have tests every 1-10 years, depending on your results and the type of screening test.  Diabetes screening. This is done by checking your blood sugar (glucose) after you have not eaten for a while (fasting). You may have this done every 1-3 years.  Mammogram. This may be done every 1-2 years. Talk with your health care provider about when you should start  having regular mammograms. This may depend on whether you have a family history of breast cancer.  BRCA-related cancer screening. This may be done if you have a family history of breast, ovarian, tubal, or peritoneal cancers.  Pelvic exam and Pap test. This may be done every 3 years starting at age 21. Starting at age 30, this may be done every 5 years if you have a Pap test in combination with an HPV test. Other tests  Sexually transmitted disease (STD) testing.  Bone density scan. This is done to screen for osteoporosis. You may have this scan if you are at high risk for osteoporosis. Follow these instructions at home: Eating and drinking  Eat a diet that includes fresh fruits and vegetables, whole grains, lean protein, and low-fat dairy.  Take vitamin and mineral supplements as recommended by your health care provider.  Do not drink alcohol if: ? Your health care provider tells you not to drink. ? You are pregnant, may be pregnant, or are planning to become pregnant.  If you drink alcohol: ? Limit how much you have to 0-1 drink a day. ? Be aware of how much alcohol is in your drink. In the [place] one drink equals one 12 oz bottle of beer (355 mL), one 5 oz glass of wine (148 mL), or one 1 oz glass of hard liquor (44 mL). Lifestyle  Take daily care of your teeth and gums.  Stay active. Exercise for at least 30 minutes on 5 or more days each week.  Do not use any products that contain nicotine or tobacco, such as cigarettes, e-cigarettes, and chewing tobacco. If you need help quitting, ask your health care provider.  If you are sexually active, practice safe sex. Use a condom or other form of birth control (contraception) in order to prevent pregnancy and STIs (sexually transmitted infections).  If told by your health care provider, take low-dose aspirin daily starting at age 59. What's next?  Visit your health care provider once a year for a well check visit.  Ask your health  care provider how often you should have your eyes and teeth checked.  Stay up to date on all vaccines. This information is not intended to replace advice given to you by your health care provider. Make sure you discuss any questions you have with your health care provider. Document Revised: 12/13/2017 Document Reviewed: 12/13/2017 Elsevier Patient Education  2020 Elsevier Inc.  

## 2020-02-03 NOTE — Assessment & Plan Note (Signed)
Repeat DEXA Recheck Vit D level

## 2020-02-03 NOTE — Assessment & Plan Note (Signed)
Reviewed last lipid panel Not currently on a statin Recheck FLP and CMP Discussed diet and exercise  

## 2020-02-03 NOTE — Assessment & Plan Note (Signed)
Moderate persistent symptoms Improving Continue Breo wil lcall back if needs dose increase Albuterol prn flonase for allergic rhinitis symptoms

## 2020-02-03 NOTE — Assessment & Plan Note (Signed)
Recheck CBC. 

## 2020-02-03 NOTE — Progress Notes (Signed)
Complete physical exam   Patient: Anne Swanson   DOB: Jul 09, 1960   59 y.o. Female  MRN: [id number] Visit Date: 02/03/2020  Today's healthcare provider: Shirlee Latch, MD   Chief Complaint  Patient presents with  . Annual Exam   Subjective    Morgin A Swanson is a 59 y.o. female who presents today for a complete physical exam.  She reports consuming a general diet. Walking everyday She generally feels well. She reports sleeping poorly. She does have additional problems to discuss today.  HPI  Shortness of breath Thumb nails  Past Medical History:  Diagnosis Date  . Anemia   . Asthma   . Complication of anesthesia    ITCHING X 30 HOURS AFTER SPINAL 1990  . Fibrocystic breast disease   . High cholesterol   . Migraines   . Osteopenia   . Vertigo    Past Surgical History:  Procedure Laterality Date  . CESAREAN SECTION     x3  . CHOLECYSTECTOMY N/A 08/06/2017   Procedure: LAPAROSCOPIC CHOLECYSTECTOMY WITH INTRAOPERATIVE CHOLANGIOGRAM;  Surgeon: Earline Mayotte, MD;  Location: [facility] ORS;  Service: General;  Laterality: N/A;  . WISDOM TOOTH EXTRACTION     Social History   Socioeconomic History  . Marital status: Married    Spouse name: Not on file  . Number of children: 3  . Years of education: Not on file  . Highest education level: Not on file  Occupational History  . Occupation: Airline pilot    Comment: Self employed  Tobacco Use  . Smoking status: Never Smoker  . Smokeless tobacco: Never Used  Vaping Use  . Vaping Use: Never used  Substance and Sexual Activity  . Alcohol use: Yes    Alcohol/week: 0.0 standard drinks    Comment: once a month  . Drug use: No  . Sexual activity: Not on file  Other Topics Concern  . Not on file  Social History Narrative  . Not on file   Social Determinants of Health   Financial Resource Strain:   . Difficulty of Paying Living Expenses: Not on file  Food Insecurity:   . Worried About Programme researcher, broadcasting/film/video in  the Last Year: Not on file  . Ran Out of Food in the Last Year: Not on file  Transportation Needs:   . Lack of Transportation (Medical): Not on file  . Lack of Transportation (Non-Medical): Not on file  Physical Activity:   . Days of Exercise per Week: Not on file  . Minutes of Exercise per Session: Not on file  Stress:   . Feeling of Stress : Not on file  Social Connections:   . Frequency of Communication with Friends and Family: Not on file  . Frequency of Social Gatherings with Friends and Family: Not on file  . Attends Religious Services: Not on file  . Active Member of Clubs or Organizations: Not on file  . Attends Banker Meetings: Not on file  . Marital Status: Not on file  Intimate Partner Violence:   . Fear of Current or Ex-Partner: Not on file  . Emotionally Abused: Not on file  . Physically Abused: Not on file  . Sexually Abused: Not on file   Family Status  Relation Name Status  . Mother  Deceased at age 16       Breast cancer and colon cancer  . Father  Deceased at age 39       died from MI  .  Brother  Deceased at age 22       fdied from Leukemia  . Mat Uncle  (Not Specified)  . Other Mat Great Grandmother Deceased   Family History  Problem Relation Age of Onset  . Colon cancer Mother   . Breast cancer Mother   . Coronary artery disease Father   . Schizophrenia Father   . Leukemia Brother   . Stomach cancer Maternal Uncle   . Breast cancer Other    Allergies  Allergen Reactions  . Lodine [Etodolac] Other (See Comments)    Facial numbness    Patient Care Team: Erasmo Downer, MD as PCP - General (Family Medicine)   Medications: Outpatient Medications Prior to Visit  Medication Sig  . fluticasone furoate-vilanterol (BREO ELLIPTA) 100-25 MCG/INH AEPB Inhale 1 puff into the lungs daily.  . VENTOLIN HFA 108 (90 Base) MCG/ACT inhaler TAKE 2 PUFFS BY MOUTH EVERY 6 HOURS AS NEEDED FOR WHEEZE   No facility-administered medications prior  to visit.    Review of Systems  Constitutional: Negative.   HENT: Positive for congestion. Negative for dental problem, drooling, ear discharge, ear pain, facial swelling, hearing loss, mouth sores, nosebleeds, postnasal drip, rhinorrhea, sinus pressure, sinus pain, sneezing, sore throat, tinnitus, trouble swallowing and voice change.   Eyes: Negative.   Respiratory: Positive for cough and chest tightness. Negative for apnea, choking, shortness of breath, wheezing and stridor.   Cardiovascular: Negative.   Gastrointestinal: Negative.   Endocrine: Negative.   Genitourinary: Negative.   Musculoskeletal: Negative.   Skin: Negative.   Allergic/Immunologic: Negative.   Neurological: Negative.   Hematological: Negative.   Psychiatric/Behavioral: Negative.       Objective    BP 120/73 (BP Location: Left Arm, Patient Position: Sitting, Cuff Size: Normal)   Pulse 76   Temp 98.2 F (36.8 C) (Oral)   Ht 5\' 2"  (1.575 m)   Wt 122 lb (55.3 kg)   LMP 02/15/2014   BMI 22.31 kg/m    Physical Exam Vitals reviewed.  Constitutional:      General: She is not in acute distress.    Appearance: Normal appearance. She is well-developed. She is not diaphoretic.  HENT:     Head: Normocephalic and atraumatic.     Right Ear: Tympanic membrane, ear canal and external ear normal.     Left Ear: Tympanic membrane, ear canal and external ear normal.  Eyes:     General: No scleral icterus.    Conjunctiva/sclera: Conjunctivae normal.     Pupils: Pupils are equal, round, and reactive to light.  Neck:     Thyroid: No thyromegaly.  Cardiovascular:     Rate and Rhythm: Normal rate and regular rhythm.     Pulses: Normal pulses.     Heart sounds: Normal heart sounds. No murmur heard.   Pulmonary:     Effort: Pulmonary effort is normal. No respiratory distress.     Breath sounds: Normal breath sounds. No wheezing or rales.  Abdominal:     General: There is no distension.     Palpations: Abdomen is  soft.     Tenderness: There is no abdominal tenderness.  Musculoskeletal:        General: No deformity.     Cervical back: Neck supple.     Right lower leg: No edema.     Left lower leg: No edema.  Lymphadenopathy:     Cervical: No cervical adenopathy.  Skin:    General: Skin is warm and dry.  Findings: No rash.  Neurological:     Mental Status: She is alert and oriented to person, place, and time. Mental status is at baseline.     Sensory: No sensory deficit.     Motor: No weakness.     Gait: Gait normal.  Psychiatric:        Mood and Affect: Mood normal.        Behavior: Behavior normal.        Thought Content: Thought content normal.      Last depression screening scores PHQ 2/9 Scores 02/03/2020 03/12/2017 06/14/2015  PHQ - 2 Score 0 0 0  PHQ- 9 Score 0 1 0   Last fall risk screening Fall Risk  02/03/2020  Falls in the past year? 0  Number falls in past yr: 0  Injury with Fall? 0  Risk for fall due to : No Fall Risks  Follow up Falls evaluation completed   Last Audit-C alcohol use screening Alcohol Use Disorder Test (AUDIT) 02/03/2020  1. How often do you have a drink containing alcohol? 2  2. How many drinks containing alcohol do you have on a typical day when you are drinking? 0  3. How often do you have six or more drinks on one occasion? 0  AUDIT-C Score 2  Alcohol Brief Interventions/Follow-up AUDIT Score <7 follow-up not indicated   A score of 3 or more in women, and 4 or more in men indicates increased risk for alcohol abuse, EXCEPT if all of the points are from question 1   No results found for any visits on 02/03/20.  Assessment & Plan    Routine Health Maintenance and Physical Exam  Exercise Activities and Dietary recommendations Goals   None     Immunization History  Administered Date(s) Administered  . Influenza,inj,Quad PF,6+ Mos 03/12/2017, 01/05/2020  . PFIZER SARS-COV-2 Vaccination 06/27/2019, 07/22/2019  . Pneumococcal  Polysaccharide-23 01/05/2020  . Tdap 03/19/2007, 01/05/2020    Health Maintenance  Topic Date Due  . MAMMOGRAM  08/02/2019  . PAP SMEAR-Modifier  03/12/2022  . COLONOSCOPY  11/06/2022  . INFLUENZA VACCINE  Completed  . COVID-19 Vaccine  Completed  . Hepatitis C Screening  Completed  . HIV Screening  Completed  . TETANUS/TDAP  Discontinued    Discussed health benefits of physical activity, and encouraged her to engage in regular exercise appropriate for her age and condition.  Problem List Items Addressed This Visit      Respiratory   Asthma    Moderate persistent symptoms Improving Continue Breo wil lcall back if needs dose increase Albuterol prn flonase for allergic rhinitis symptoms        Musculoskeletal and Integument   Osteopenia    Repeat DEXA Recheck Vit D level      Relevant Orders   DG Bone Density     Other   History of anemia    Recheck CBC      Relevant Orders   CBC w/Diff/Platelet   Vitamin D deficiency    Recheck Vit D level      Relevant Orders   VITAMIN D 25 Hydroxy (Vit-D Deficiency, Fractures)   Hyperlipidemia    Reviewed last lipid panel Not currently on a statin Recheck FLP and CMP Discussed diet and exercise       Relevant Orders   Comprehensive metabolic panel   Lipid panel    Other Visit Diagnoses    Encounter for annual physical exam    -  Primary   Relevant  Orders   MM 3D SCREEN BREAST BILATERAL   HIV antibody (with reflex)   Comprehensive metabolic panel   Lipid panel   VITAMIN D 25 Hydroxy (Vit-D Deficiency, Fractures)   Ambulatory referral to Gastroenterology   CBC w/Diff/Platelet   Encounter for screening for HIV       Relevant Orders   HIV antibody (with reflex)   Colon cancer screening       Relevant Orders   Ambulatory referral to Gastroenterology   Encounter for screening mammogram for malignant neoplasm of breast       Relevant Orders   MM 3D SCREEN BREAST BILATERAL       Return in about 6 months  (around 08/03/2020) for chronic disease f/u.     I, Shirlee LatchAngela Macen Joslin, MD, have reviewed all documentation for this visit. The documentation on 02/03/20 for the exam, diagnosis, procedures, and orders are all accurate and complete.   Yahel Fuston, Marzella SchleinAngela M, MD, MPH [facility]

## 2020-02-03 NOTE — Assessment & Plan Note (Signed)
Recheck Vit D level 

## 2020-02-04 LAB — CBC WITH DIFFERENTIAL/PLATELET
Basophils Absolute: 0 10*3/uL (ref 0.0–0.2)
Basos: 1 %
EOS (ABSOLUTE): 0.1 10*3/uL (ref 0.0–0.4)
Eos: 2 %
Hematocrit: 40.2 % (ref 34.0–46.6)
Hemoglobin: 13.6 g/dL (ref 11.1–15.9)
Immature Grans (Abs): 0 10*3/uL (ref 0.0–0.1)
Immature Granulocytes: 0 %
Lymphocytes Absolute: 1.3 10*3/uL (ref 0.7–3.1)
Lymphs: 31 %
MCH: 29.8 pg (ref 26.6–33.0)
MCHC: 33.8 g/dL (ref 31.5–35.7)
MCV: 88 fL (ref 79–97)
Monocytes Absolute: 0.3 10*3/uL (ref 0.1–0.9)
Monocytes: 7 %
Neutrophils Absolute: 2.5 10*3/uL (ref 1.4–7.0)
Neutrophils: 59 %
Platelets: 174 10*3/uL (ref 150–450)
RBC: 4.56 x10E6/uL (ref 3.77–5.28)
RDW: 13.1 % (ref 11.7–15.4)
WBC: 4.2 10*3/uL (ref 3.4–10.8)

## 2020-02-04 LAB — LIPID PANEL
Chol/HDL Ratio: 3.7 ratio (ref 0.0–4.4)
Cholesterol, Total: 223 mg/dL — ABNORMAL HIGH (ref 100–199)
HDL: 60 mg/dL (ref >39)
LDL Chol Calc (NIH): 153 mg/dL — ABNORMAL HIGH (ref 0–99)
Triglycerides: 58 mg/dL (ref 0–149)
VLDL Cholesterol Cal: 10 mg/dL (ref 5–40)

## 2020-02-04 LAB — COMPREHENSIVE METABOLIC PANEL
ALT: 12 IU/L (ref 0–32)
AST: 17 IU/L (ref 0–40)
Albumin/Globulin Ratio: 1.9 (ref 1.2–2.2)
Albumin: 4.3 g/dL (ref 3.8–4.9)
Alkaline Phosphatase: 92 IU/L (ref 44–121)
BUN/Creatinine Ratio: 15 (ref 9–23)
BUN: 9 mg/dL (ref 6–24)
Bilirubin Total: 0.5 mg/dL (ref 0.0–1.2)
CO2: 23 mmol/L (ref 20–29)
Calcium: 9.1 mg/dL (ref 8.7–10.2)
Chloride: 104 mmol/L (ref 96–106)
Creatinine, Ser: 0.59 mg/dL (ref 0.57–1.00)
GFR calc Af Amer: 116 mL/min/{1.73_m2} (ref >59)
GFR calc non Af Amer: 101 mL/min/{1.73_m2} (ref >59)
Globulin, Total: 2.3 g/dL (ref 1.5–4.5)
Glucose: 100 mg/dL — ABNORMAL HIGH (ref 65–99)
Potassium: 4 mmol/L (ref 3.5–5.2)
Sodium: 142 mmol/L (ref 134–144)
Total Protein: 6.6 g/dL (ref 6.0–8.5)

## 2020-02-04 LAB — HIV ANTIBODY (ROUTINE TESTING W REFLEX): HIV Screen 4th Generation wRfx: NONREACTIVE

## 2020-02-04 LAB — VITAMIN D 25 HYDROXY (VIT D DEFICIENCY, FRACTURES): Vit D, 25-Hydroxy: 18.9 ng/mL — ABNORMAL LOW (ref 30.0–100.0)

## 2020-02-06 ENCOUNTER — Telehealth: Payer: Self-pay

## 2020-02-06 NOTE — Telephone Encounter (Signed)
-----   Message from Erasmo Downer, MD sent at 02/05/2020 11:02 AM EDT ----- Normal labs, except low vitamin D.  Recommend 1000 to 2000 units of OTC vitamin D3 daily.  Cholesterol is elevated. The 10-year ASCVD (heart disease and stroke) risk score Denman George DC Jr., et al., 2013) is: 2.7%, which is low.  No medication required at this time, but I do recommend diet low in saturated fat and regular exercise - 30 min at least 5 times per week.

## 2020-02-06 NOTE — Telephone Encounter (Signed)
Written by Erasmo Downer, MD on 02/05/2020 11:02 AM EDT Seen by patient Anne Swanson on 02/05/2020 2:26 PM

## 2020-02-11 ENCOUNTER — Encounter: Payer: Self-pay | Admitting: General Practice

## 2020-02-12 ENCOUNTER — Other Ambulatory Visit: Payer: Self-pay | Admitting: Family Medicine

## 2020-02-12 DIAGNOSIS — J45909 Unspecified asthma, uncomplicated: Secondary | ICD-10-CM

## 2020-02-19 ENCOUNTER — Other Ambulatory Visit: Payer: Self-pay

## 2020-02-19 ENCOUNTER — Telehealth (INDEPENDENT_AMBULATORY_CARE_PROVIDER_SITE_OTHER): Payer: Self-pay | Admitting: Gastroenterology

## 2020-02-19 DIAGNOSIS — Z800 Family history of malignant neoplasm of digestive organs: Secondary | ICD-10-CM

## 2020-02-19 DIAGNOSIS — Z1211 Encounter for screening for malignant neoplasm of colon: Secondary | ICD-10-CM

## 2020-02-19 MED ORDER — NA SULFATE-K SULFATE-MG SULF 17.5-3.13-1.6 GM/177ML PO SOLN
1.0000 | Freq: Once | ORAL | 0 refills | Status: AC
Start: 2020-02-19 — End: 2020-02-19

## 2020-02-19 NOTE — Progress Notes (Signed)
Gastroenterology Pre-Procedure Review  Request Date: 03/02/20 Requesting Physician: Dr. Allen Norris  PATIENT REVIEW QUESTIONS: The patient responded to the following health history questions as indicated:    1. Are you having any GI issues? no 2. Do you have a personal history of Polyps? no 3. Do you have a family history of Colon Cancer or Polyps? yes (mother colon cancer) 4. Diabetes Mellitus? no 5. Joint replacements in the past 12 months?no 6. Major health problems in the past 3 months?Gallbladder surgery 2019 7. Any artificial heart valves, MVP, or defibrillator?no    MEDICATIONS & ALLERGIES:    Patient reports the following regarding taking any anticoagulation/antiplatelet therapy:   Plavix, Coumadin, Eliquis, Xarelto, Lovenox, Pradaxa, Brilinta, or Effient? no Aspirin? no  Patient confirms/reports the following medications:  Current Outpatient Medications  Medication Sig Dispense Refill  . albuterol (VENTOLIN HFA) 108 (90 Base) MCG/ACT inhaler INHALE 2 PUFFS BY MOUTH EVERY 6 HOURS AS NEEDED FOR WHEEZE 8.5 each 11  . fluticasone furoate-vilanterol (BREO ELLIPTA) 100-25 MCG/INH AEPB Inhale 1 puff into the lungs daily. 30 each 3  . Na Sulfate-K Sulfate-Mg Sulf 17.5-3.13-1.6 GM/177ML SOLN Take 1 kit by mouth once for 1 dose. 354 mL 0   No current facility-administered medications for this visit.    Patient confirms/reports the following allergies:  Allergies  Allergen Reactions  . Lodine [Etodolac] Other (See Comments)    Facial numbness    No orders of the defined types were placed in this encounter.   AUTHORIZATION INFORMATION Primary Insurance: 1D#: Group #:  Secondary Insurance: 1D#: Group #:  SCHEDULE INFORMATION: Date: Tuesday 03/02/20 Time: Location:[facility]

## 2020-02-27 ENCOUNTER — Other Ambulatory Visit
Admission: RE | Admit: 2020-02-27 | Discharge: 2020-02-27 | Disposition: A | Discharge status: Home / Self Care | Payer: Managed Care, Other (non HMO) | Source: Ambulatory Visit | Attending: Gastroenterology | Admitting: Gastroenterology

## 2020-02-27 ENCOUNTER — Other Ambulatory Visit: Payer: Self-pay

## 2020-02-27 DIAGNOSIS — Z20822 Contact with and (suspected) exposure to covid-19: Secondary | ICD-10-CM | POA: Insufficient documentation

## 2020-02-27 DIAGNOSIS — Z01812 Encounter for preprocedural laboratory examination: Secondary | ICD-10-CM | POA: Insufficient documentation

## 2020-02-28 LAB — SARS CORONAVIRUS 2 (TAT 6-24 HRS): SARS Coronavirus 2: NEGATIVE

## 2020-03-02 ENCOUNTER — Ambulatory Visit: Payer: Managed Care, Other (non HMO) | Admitting: Anesthesiology

## 2020-03-02 ENCOUNTER — Encounter
Admission: RE | Disposition: A | Discharge status: Home / Self Care | Payer: Self-pay | Source: Home / Self Care | Attending: Gastroenterology

## 2020-03-02 ENCOUNTER — Encounter: Payer: Self-pay | Admitting: Gastroenterology

## 2020-03-02 ENCOUNTER — Ambulatory Visit
Admission: RE | Admit: 2020-03-02 | Discharge: 2020-03-02 | Disposition: A | Discharge status: Home / Self Care | Payer: Managed Care, Other (non HMO) | Attending: Gastroenterology | Admitting: Gastroenterology

## 2020-03-02 DIAGNOSIS — Z800 Family history of malignant neoplasm of digestive organs: Secondary | ICD-10-CM

## 2020-03-02 DIAGNOSIS — Z7951 Long term (current) use of inhaled steroids: Secondary | ICD-10-CM | POA: Diagnosis not present

## 2020-03-02 DIAGNOSIS — K640 First degree hemorrhoids: Secondary | ICD-10-CM | POA: Insufficient documentation

## 2020-03-02 DIAGNOSIS — K635 Polyp of colon: Secondary | ICD-10-CM

## 2020-03-02 DIAGNOSIS — Z1211 Encounter for screening for malignant neoplasm of colon: Secondary | ICD-10-CM | POA: Diagnosis not present

## 2020-03-02 HISTORY — PX: COLONOSCOPY WITH PROPOFOL: SHX5780

## 2020-03-02 SURGERY — COLONOSCOPY WITH PROPOFOL
Anesthesia: General

## 2020-03-02 MED ORDER — SODIUM CHLORIDE 0.9 % IV SOLN: INTRAVENOUS | Status: DC

## 2020-03-02 MED ORDER — PROPOFOL 500 MG/50ML IV EMUL
INTRAVENOUS | Status: AC
  Filled 2020-03-02: qty 50

## 2020-03-02 MED ORDER — PROPOFOL 500 MG/50ML IV EMUL
INTRAVENOUS | Status: DC | PRN
  Administered 2020-03-02: 120 ug/kg/min via INTRAVENOUS

## 2020-03-02 MED ORDER — LIDOCAINE HCL (CARDIAC) PF 100 MG/5ML IV SOSY
PREFILLED_SYRINGE | INTRAVENOUS | Status: DC | PRN
  Administered 2020-03-02: 50 mg via INTRAVENOUS

## 2020-03-02 MED ORDER — PROPOFOL 10 MG/ML IV BOLUS
INTRAVENOUS | Status: DC | PRN
  Administered 2020-03-02: 100 mg via INTRAVENOUS

## 2020-03-02 MED ORDER — LIDOCAINE HCL (PF) 2 % IJ SOLN
INTRAMUSCULAR | Status: AC
  Filled 2020-03-02: qty 5

## 2020-03-02 NOTE — Anesthesia Postprocedure Evaluation (Signed)
Anesthesia Post Note  Patient: Anne Swanson  Procedure(s) Performed: COLONOSCOPY WITH PROPOFOL (N/A )  Patient location during evaluation: Endoscopy Anesthesia Type: General Level of consciousness: awake and alert Pain management: pain level controlled Vital Signs Assessment: post-procedure vital signs reviewed and stable Respiratory status: spontaneous breathing, nonlabored ventilation, respiratory function stable and patient connected to nasal cannula oxygen Cardiovascular status: blood pressure returned to baseline and stable Postop Assessment: no apparent nausea or vomiting Anesthetic complications: no   No complications documented.   Last Vitals:  Vitals:   03/02/20 0819 03/02/20 0935  BP: 114/77 (!) 90/47  Pulse: 82   Resp: 14 16  Temp: (!) 36 C (!) 35.9 C  SpO2: 100% 98%    Last Pain:  Vitals:   03/02/20 1005  TempSrc:   PainSc: 0-No pain                 Corinda Gubler

## 2020-03-02 NOTE — Anesthesia Preprocedure Evaluation (Signed)
Anesthesia Evaluation  Patient identified by MRN, date of birth, ID band Patient awake    Reviewed: Allergy & Precautions, H&P , NPO status , Patient's Chart, lab work & pertinent test results  History of Anesthesia Complications (+) history of anesthetic complications (itching after spinal)  Airway Mallampati: III  TM Distance: >3 FB Neck ROM: full    Dental  (+) Chipped   Pulmonary neg shortness of breath, asthma ,    Pulmonary exam normal        Cardiovascular Exercise Tolerance: Good negative cardio ROS Normal cardiovascular exam     Neuro/Psych  Headaches, negative psych ROS   GI/Hepatic negative GI ROS, Neg liver ROS, neg GERD  ,  Endo/Other  negative endocrine ROS  Renal/GU negative Renal ROS  negative genitourinary   Musculoskeletal   Abdominal   Peds  Hematology negative hematology ROS (+)   Anesthesia Other Findings Past Medical History: No date: Anemia No date: Asthma No date: Complication of anesthesia     Comment:  ITCHING X 30 HOURS AFTER SPINAL 1990 No date: Fibrocystic breast disease No date: High cholesterol No date: Migraines No date: Osteopenia No date: Vertigo  Past Surgical History: No date: CESAREAN SECTION     Comment:  x3 08/06/2017: CHOLECYSTECTOMY; N/A     Comment:  Procedure: LAPAROSCOPIC CHOLECYSTECTOMY WITH               INTRAOPERATIVE CHOLANGIOGRAM;  Surgeon: Earline Mayotte, MD;  Location: [facility] ORS;  Service: General;                Laterality: N/A; No date: COLONOSCOPY No date: WISDOM TOOTH EXTRACTION  BMI    Body Mass Index: 21.22 kg/m      Reproductive/Obstetrics negative OB ROS                             Anesthesia Physical Anesthesia Plan  ASA: III  Anesthesia Plan: General   Post-op Pain Management:    Induction: Intravenous  PONV Risk Score and Plan: Propofol infusion and TIVA  Airway Management Planned:  Natural Airway and Nasal Cannula  Additional Equipment:   Intra-op Plan:   Post-operative Plan:   Informed Consent: I have reviewed the patients History and Physical, chart, labs and discussed the procedure including the risks, benefits and alternatives for the proposed anesthesia with the patient or authorized representative who has indicated his/her understanding and acceptance.     Dental Advisory Given  Plan Discussed with: Anesthesiologist, CRNA and Surgeon  Anesthesia Plan Comments: (Patient consented for risks of anesthesia including but not limited to:  - adverse reactions to medications - risk of intubation if required - damage to eyes, teeth, lips or other oral mucosa - nerve damage due to positioning  - sore throat or hoarseness - Damage to heart, brain, nerves, lungs, other parts of body or loss of life  Patient voiced understanding.)        Anesthesia Quick Evaluation

## 2020-03-02 NOTE — H&P (Signed)
Anne Minium, MD [facility] [address] [place], [place] [address] Phone: [phone] Fax : [phone]  Primary Care Physician:  Anne Downer, MD Primary Gastroenterologist:  Dr. Servando Snare  Pre-Procedure History & Physical: HPI:  Anne Swanson is a 59 y.o. female is here for a screening colonoscopy.   Past Medical History:  Diagnosis Date  . Anemia   . Asthma   . Complication of anesthesia    ITCHING X 30 HOURS AFTER SPINAL 1990  . Fibrocystic breast disease   . High cholesterol   . Migraines   . Osteopenia   . Vertigo     Past Surgical History:  Procedure Laterality Date  . CESAREAN SECTION     x3  . CHOLECYSTECTOMY N/A 08/06/2017   Procedure: LAPAROSCOPIC CHOLECYSTECTOMY WITH INTRAOPERATIVE CHOLANGIOGRAM;  Surgeon: Earline Mayotte, MD;  Location: [facility] ORS;  Service: General;  Laterality: N/A;  . COLONOSCOPY    . WISDOM TOOTH EXTRACTION      Prior to Admission medications   Medication Sig Start Date End Date Taking? Authorizing Provider  albuterol (VENTOLIN HFA) 108 (90 Base) MCG/ACT inhaler INHALE 2 PUFFS BY MOUTH EVERY 6 HOURS AS NEEDED FOR WHEEZE 02/12/20  Yes Bacigalupo, Marzella Schlein, MD  fluticasone furoate-vilanterol (BREO ELLIPTA) 100-25 MCG/INH AEPB Inhale 1 puff into the lungs daily. 01/01/20  Yes Bacigalupo, Marzella Schlein, MD  loratadine (CLARITIN) 10 MG tablet Take 10 mg by mouth daily.   Yes [provider]    Allergies as of 02/20/2020 - Review Complete 02/19/2020  Allergen Reaction Noted  . Lodine [etodolac] Other (See Comments) 11/16/2014    Family History  Problem Relation Age of Onset  . Colon cancer Mother   . Breast cancer Mother   . Coronary artery disease Father   . Schizophrenia Father   . Leukemia Brother   . Stomach cancer Maternal Uncle   . Breast cancer Other     Social History   Socioeconomic History  . Marital status: Married    Spouse name: Not on file  . Number of children: 3  . Years of education: Not on  file  . Highest education level: Not on file  Occupational History  . Occupation: Airline pilot    Comment: Self employed  Tobacco Use  . Smoking status: Never Smoker  . Smokeless tobacco: Never Used  Vaping Use  . Vaping Use: Never used  Substance and Sexual Activity  . Alcohol use: Yes    Alcohol/week: 0.0 standard drinks    Comment: once a month  . Drug use: No  . Sexual activity: Yes  Other Topics Concern  . Not on file  Social History Narrative  . Not on file   Social Determinants of Health   Financial Resource Strain:   . Difficulty of Paying Living Expenses: Not on file  Food Insecurity:   . Worried About Programme researcher, broadcasting/film/video in the Last Year: Not on file  . Ran Out of Food in the Last Year: Not on file  Transportation Needs:   . Lack of Transportation (Medical): Not on file  . Lack of Transportation (Non-Medical): Not on file  Physical Activity:   . Days of Exercise per Week: Not on file  . Minutes of Exercise per Session: Not on file  Stress:   . Feeling of Stress : Not on file  Social Connections:   . Frequency of Communication with Friends and Family: Not on file  . Frequency of Social Gatherings with Friends and  Family: Not on file  . Attends Religious Services: Not on file  . Active Member of Clubs or Organizations: Not on file  . Attends Banker Meetings: Not on file  . Marital Status: Not on file  Intimate Partner Violence:   . Fear of Current or Ex-Partner: Not on file  . Emotionally Abused: Not on file  . Physically Abused: Not on file  . Sexually Abused: Not on file    Review of Systems: See HPI, otherwise negative ROS  Physical Exam: BP 114/77   Pulse 82   Temp (!) 96.8 F (36 C) (Temporal)   Resp 14   Ht 5\' 2"  (1.575 m)   Wt 52.6 kg   LMP 02/15/2014   SpO2 100%   BMI 21.22 kg/m  General:   Alert,  pleasant and cooperative in NAD Head:  Normocephalic and atraumatic. Neck:  Supple; no masses or thyromegaly. Lungs:  Clear  throughout to auscultation.    Heart:  Regular rate and rhythm. Abdomen:  Soft, nontender and nondistended. Normal bowel sounds, without guarding, and without rebound.   Neurologic:  Alert and  oriented x4;  grossly normal neurologically.  Impression/Plan: Anne Swanson is now here to undergo a screening colonoscopy.  Risks, benefits, and alternatives regarding colonoscopy have been reviewed with the patient.  Questions have been answered.  All parties agreeable.

## 2020-03-02 NOTE — Transfer of Care (Signed)
Immediate Anesthesia Transfer of Care Note  Patient: Anne Swanson  Procedure(s) Performed: COLONOSCOPY WITH PROPOFOL (N/A )  Patient Location: Endoscopy Unit  Anesthesia Type:General  Level of Consciousness: drowsy and patient cooperative  Airway & Oxygen Therapy: Patient Spontanous Breathing  Post-op Assessment: Report given to RN and Post -op Vital signs reviewed and stable  Post vital signs: Reviewed and stable  Last Vitals:  Vitals Value Taken Time  BP 90/47 03/02/20 0935  Temp 35.9 C 03/02/20 0935  Pulse 65 03/02/20 0935  Resp 13 03/02/20 0935  SpO2 98 % 03/02/20 0935  Vitals shown include unvalidated device data.  Last Pain:  Vitals:   03/02/20 0935  TempSrc: Temporal  PainSc:          Complications: No complications documented.

## 2020-03-02 NOTE — Op Note (Signed)
[facility] Patient Name: Anne Swanson Procedure Date: 03/02/2020 9:06 AM MRN: [id number] Account #: [id number] Date of Birth: 24-Feb-1961 Admit Type: Outpatient Age: 59 Room: [facility] ENDO ROOM 4 Gender: Female Note Status: Finalized Procedure:             Colonoscopy Indications:           Screening for colorectal malignant neoplasm Providers:             Midge Minium MD, MD Referring MD:          Marzella Schlein. Bacigalupo (Referring MD) Medicines:             Propofol per Anesthesia Complications:         No immediate complications. Procedure:             Pre-Anesthesia Assessment:                        - Prior to the procedure, a History and Physical was                         performed, and patient medications and allergies were                         reviewed. The patient's tolerance of previous                         anesthesia was also reviewed. The risks and benefits                         of the procedure and the sedation options and risks                         were discussed with the patient. All questions were                         answered, and informed consent was obtained. Prior                         Anticoagulants: The patient has taken no previous                         anticoagulant or antiplatelet agents. ASA Grade                         Assessment: II - A patient with mild systemic disease.                         After reviewing the risks and benefits, the patient                         was deemed in satisfactory condition to undergo the                         procedure.                        After obtaining informed consent, the colonoscope was  passed under direct vision. Throughout the procedure,                         the patient's blood pressure, pulse, and oxygen                         saturations were monitored continuously. The                         Colonoscope was introduced through  the anus and                         advanced to the the cecum, identified by appendiceal                         orifice and ileocecal valve. The colonoscopy was                         performed without difficulty. The patient tolerated                         the procedure well. The quality of the bowel                         preparation was excellent. Findings:      The perianal and digital rectal examinations were normal.      A 5 mm polyp was found in the descending colon. The polyp was sessile.       The polyp was removed with a cold snare. Resection and retrieval were       complete.      Non-bleeding internal hemorrhoids were found during retroflexion. The       hemorrhoids were Grade I (internal hemorrhoids that do not prolapse). Impression:            - One 5 mm polyp in the descending colon, removed with                         a cold snare. Resected and retrieved.                        - Non-bleeding internal hemorrhoids. Recommendation:        - Discharge patient to home.                        - Resume previous diet.                        - Continue present medications.                        - Await pathology results.                        - Repeat colonoscopy in 5 years for surveillance. Procedure Code(s):     --- Professional ---                        [phone], Colonoscopy, flexible; with removal of  tumor(s), polyp(s), or other lesion(s) by snare                         technique Diagnosis Code(s):     --- Professional ---                        Z12.11, Encounter for screening for malignant neoplasm                         of colon                        K63.5, Polyp of colon CPT copyright 2019 [employer] Association. All rights reserved. The codes documented in this report are preliminary and upon coder review may  be revised to meet current compliance requirements. Midge Minium MD, MD 03/02/2020 9:32:50 AM This report has been  signed electronically. Number of Addenda: 0 Note Initiated On: 03/02/2020 9:06 AM Scope Withdrawal Time: 0 hours 5 minutes 53 seconds  Total Procedure Duration: 0 hours 15 minutes 44 seconds  Estimated Blood Loss:  Estimated blood loss: none.      [facility]

## 2020-03-03 ENCOUNTER — Encounter: Payer: Self-pay | Admitting: Gastroenterology

## 2020-03-04 ENCOUNTER — Encounter: Payer: Self-pay | Admitting: Gastroenterology

## 2020-03-04 LAB — SURGICAL PATHOLOGY

## 2020-03-17 ENCOUNTER — Other Ambulatory Visit: Payer: Self-pay

## 2020-03-17 ENCOUNTER — Ambulatory Visit
Admission: RE | Admit: 2020-03-17 | Discharge: 2020-03-17 | Disposition: A | Discharge status: Home / Self Care | Payer: Managed Care, Other (non HMO) | Source: Ambulatory Visit | Attending: Family Medicine | Admitting: Family Medicine

## 2020-03-17 ENCOUNTER — Telehealth: Payer: Self-pay

## 2020-03-17 DIAGNOSIS — Z1231 Encounter for screening mammogram for malignant neoplasm of breast: Secondary | ICD-10-CM | POA: Diagnosis present

## 2020-03-17 DIAGNOSIS — Z0000 Encounter for general adult medical examination without abnormal findings: Secondary | ICD-10-CM | POA: Insufficient documentation

## 2020-03-17 DIAGNOSIS — M8580 Other specified disorders of bone density and structure, unspecified site: Secondary | ICD-10-CM

## 2020-03-17 NOTE — Telephone Encounter (Signed)
Written by Erasmo Downer, MD on 03/17/2020 10:17 AM EST Seen by patient Anne Swanson on 03/17/2020 12:30 PM

## 2020-03-17 NOTE — Telephone Encounter (Signed)
-----   Message from Erasmo Downer, MD sent at 03/17/2020 10:17 AM EST ----- Bone density scan shows osteopenia (this is some bone loss, but not as bad as osteoporosis).  Recommend regular weight bearing exercise, avoiding smoking, and adequate Ca (1200mg /day) and Vit D (1000 units daily) via diet or supplement.  We will recheck in 2 years to ensure this hasn't worsened.

## 2020-03-22 ENCOUNTER — Ambulatory Visit: Payer: Self-pay

## 2020-03-22 ENCOUNTER — Other Ambulatory Visit: Payer: Self-pay

## 2020-03-22 ENCOUNTER — Emergency Department
Admission: EM | Admit: 2020-03-22 | Discharge: 2020-03-22 | Disposition: A | Discharge status: Home / Self Care | Payer: Managed Care, Other (non HMO) | Attending: Student in an Organized Health Care Education/Training Program | Admitting: Student in an Organized Health Care Education/Training Program

## 2020-03-22 ENCOUNTER — Emergency Department: Payer: Managed Care, Other (non HMO)

## 2020-03-22 DIAGNOSIS — R202 Paresthesia of skin: Secondary | ICD-10-CM | POA: Diagnosis not present

## 2020-03-22 DIAGNOSIS — R079 Chest pain, unspecified: Secondary | ICD-10-CM

## 2020-03-22 DIAGNOSIS — J45909 Unspecified asthma, uncomplicated: Secondary | ICD-10-CM | POA: Insufficient documentation

## 2020-03-22 DIAGNOSIS — R0789 Other chest pain: Secondary | ICD-10-CM | POA: Diagnosis present

## 2020-03-22 DIAGNOSIS — R0602 Shortness of breath: Secondary | ICD-10-CM | POA: Diagnosis not present

## 2020-03-22 LAB — TROPONIN I (HIGH SENSITIVITY)
Troponin I (High Sensitivity): 10 ng/L (ref <18)
Troponin I (High Sensitivity): 10 ng/L (ref <18)
Troponin I (High Sensitivity): 9 ng/L (ref <18)

## 2020-03-22 LAB — CBC
HCT: 42.9 % (ref 36.0–46.0)
Hemoglobin: 14.4 g/dL (ref 12.0–15.0)
MCH: 29.8 pg (ref 26.0–34.0)
MCHC: 33.6 g/dL (ref 30.0–36.0)
MCV: 88.8 fL (ref 80.0–100.0)
Platelets: 202 10*3/uL (ref 150–400)
RBC: 4.83 MIL/uL (ref 3.87–5.11)
RDW: 12.4 % (ref 11.5–15.5)
WBC: 4.9 10*3/uL (ref 4.0–10.5)
nRBC: 0 % (ref 0.0–0.2)

## 2020-03-22 LAB — BASIC METABOLIC PANEL
Anion gap: 10 (ref 5–15)
BUN: 12 mg/dL (ref 6–20)
CO2: 25 mmol/L (ref 22–32)
Calcium: 9 mg/dL (ref 8.9–10.3)
Chloride: 105 mmol/L (ref 98–111)
Creatinine, Ser: 0.69 mg/dL (ref 0.44–1.00)
GFR, Estimated: >60 mL/min (ref >60)
Glucose, Bld: 98 mg/dL (ref 70–99)
Potassium: 3.6 mmol/L (ref 3.5–5.1)
Sodium: 140 mmol/L (ref 135–145)

## 2020-03-22 LAB — FIBRIN DERIVATIVES D-DIMER (ARMC ONLY): Fibrin derivatives D-dimer (ARMC): 484.79 ng/mL (FEU) (ref 0.00–499.00)

## 2020-03-22 MED ORDER — PREDNISONE 20 MG PO TABS: 40.0000 mg | ORAL_TABLET | Freq: Every day | ORAL | 0 refills | Status: AC

## 2020-03-22 MED ORDER — IPRATROPIUM-ALBUTEROL 0.5-2.5 (3) MG/3ML IN SOLN
3.0000 mL | Freq: Once | RESPIRATORY_TRACT | Status: AC
  Administered 2020-03-22: 3 mL via RESPIRATORY_TRACT
  Filled 2020-03-22: qty 3

## 2020-03-22 NOTE — Telephone Encounter (Signed)
Noted  

## 2020-03-22 NOTE — ED Notes (Signed)
ED Provider at bedside. 

## 2020-03-22 NOTE — Telephone Encounter (Signed)
Woke up at 0300 this am  With severe chest pain x 5 minutes- Chest pain located breast bone and back "all over". Slight SOB. Pain was severe. Pt having chest tightness and numbness to left arm. Pt c/o fatigue and "wobbly with walking." Care advice given and pt to go to ED ASAP.   Reason for Disposition . [1] Chest pain lasts > 5 minutes AND [2] age > 72  Answer Assessment - Initial Assessment Questions 1. LOCATION: "Where does it hurt?"      - now having tightness in chest left arm is numb 2. RADIATION: "Does the pain go anywhere else?" (e.g., into neck, jaw, arms, back)     no 3. ONSET: "When did the chest pain begin?" (Minutes, hours or days)     0300 this am  4. PATTERN "Does the pain come and go, or has it been constant since it started?"  "Does it get worse with exertion?"      Constant does not worsen  5. DURATION: "How long does it last" (e.g., seconds, minutes, hours)     constant 6. SEVERITY: "How bad is the pain?"  (e.g., Scale 1-10; mild, moderate, or severe)    - MILD (1-3): doesn't interfere with normal activities     - MODERATE (4-7): interferes with normal activities or awakens from sleep    - SEVERE (8-10): excruciating pain, unable to do any normal activities       6/10 7. CARDIAC RISK FACTORS: "Do you have any history of heart problems or risk factors for heart disease?" (e.g., angina, prior heart attack; diabetes, high blood pressure, high cholesterol, smoker, or strong family history of heart disease)     no 8. PULMONARY RISK FACTORS: "Do you have any history of lung disease?"  (e.g., blood clots in lung, asthma, emphysema, birth control pills)     asthma 9. CAUSE: "What do you think is causing the chest pain?"     unknown 10. OTHER SYMPTOMS: "Do you have any other symptoms?" (e.g., dizziness, nausea, vomiting, sweating, fever, difficulty breathing, cough)       Balance is off, overall feels fatigue, cough  Protocols used: CHEST PAIN-A-AH

## 2020-03-22 NOTE — ED Provider Notes (Signed)
[facility] Emergency Department Provider Note    First MD Initiated Contact with Patient 03/22/20 1335     (approximate)  I have reviewed the triage vital signs and the nursing notes.   HISTORY  Chief Complaint Chest Pain    HPI Anne Swanson is a 59 y.o. female presents to the ER for evaluation of  5 minutes of severe chest pain midsternal associated with tightness and some tingling in her left arm.  Was also complaining of some shortness of breath at this.  She status post Covid illness back in August.  Denies any history of heart trouble.  Does have a history of asthma.  Does feel somewhat like her asthma is flaring up.  Denies any diaphoresis.  No pain radiating through to her back or jaw.  Has never had pain like this before.  On arrival to the ER she is pain-free.   Past Medical History:  Diagnosis Date  . Anemia   . Asthma   . Complication of anesthesia    ITCHING X 30 HOURS AFTER SPINAL 1990  . Fibrocystic breast disease   . High cholesterol   . Migraines   . Osteopenia   . Vertigo    Family History  Problem Relation Age of Onset  . Colon cancer Mother   . Breast cancer Mother   . Coronary artery disease Father   . Schizophrenia Father   . Leukemia Brother   . Stomach cancer Maternal Uncle   . Breast cancer Other    Past Surgical History:  Procedure Laterality Date  . CESAREAN SECTION     x3  . CHOLECYSTECTOMY N/A 08/06/2017   Procedure: LAPAROSCOPIC CHOLECYSTECTOMY WITH INTRAOPERATIVE CHOLANGIOGRAM;  Surgeon: Earline Mayotte, MD;  Location: [facility] ORS;  Service: General;  Laterality: N/A;  . COLONOSCOPY    . COLONOSCOPY WITH PROPOFOL N/A 03/02/2020   Procedure: COLONOSCOPY WITH PROPOFOL;  Surgeon: Midge Minium, MD;  Location: [facility] ENDOSCOPY;  Service: Endoscopy;  Laterality: N/A;  . WISDOM TOOTH EXTRACTION     Patient Active Problem List   Diagnosis Date Noted  . Encounter for screening colonoscopy   . Polyp of descending  colon   . Calculus of gallbladder without cholecystitis without obstruction 06/29/2017  . Hyperlipidemia 03/01/2015  . Asthma 11/16/2014  . Diffuse cystic mastopathy of breast 11/16/2014  . History of anemia 11/16/2014  . Osteopenia 11/16/2014  . Vitamin D deficiency 11/16/2014      Prior to Admission medications   Medication Sig Start Date End Date Taking? Authorizing Provider  albuterol (VENTOLIN HFA) 108 (90 Base) MCG/ACT inhaler INHALE 2 PUFFS BY MOUTH EVERY 6 HOURS AS NEEDED FOR WHEEZE 02/12/20   Bacigalupo, Marzella Schlein, MD  fluticasone furoate-vilanterol (BREO ELLIPTA) 100-25 MCG/INH AEPB Inhale 1 puff into the lungs daily. 01/01/20   Erasmo Downer, MD  loratadine (CLARITIN) 10 MG tablet Take 10 mg by mouth daily.    [provider]  predniSONE (DELTASONE) 20 MG tablet Take 2 tablets (40 mg total) by mouth daily for 5 days. 03/22/20 03/27/20  Willy Eddy, MD    Allergies Lodine [etodolac]    Social History Social History   Tobacco Use  . Smoking status: Never Smoker  . Smokeless tobacco: Never Used  Vaping Use  . Vaping Use: Never used  Substance Use Topics  . Alcohol use: Yes    Alcohol/week: 0.0 standard drinks    Comment: once a month  . Drug use: No    Review  of Systems Patient denies headaches, rhinorrhea, blurry vision, numbness, shortness of breath, chest pain, edema, cough, abdominal pain, nausea, vomiting, diarrhea, dysuria, fevers, rashes or hallucinations unless otherwise stated above in HPI. ____________________________________________   PHYSICAL EXAM:  VITAL SIGNS: Vitals:   03/22/20 1036 03/22/20 1323  BP: (!) 147/77 (!) 133/59  Pulse: 78 61  Resp: 17 16  Temp: 98.5 F (36.9 C) 99.1 F (37.3 C)  SpO2: 97% 99%    Constitutional: Alert and oriented.  Eyes: Conjunctivae are normal.  Head: Atraumatic. Nose: No congestion/rhinnorhea. Mouth/Throat: Mucous membranes are moist.   Neck: No stridor. Painless ROM.    Cardiovascular: Normal rate, regular rhythm. Grossly normal heart sounds.  Good peripheral circulation. Respiratory: Normal respiratory effort.  No retractions. Lungs with faint scattered wheeze Gastrointestinal: Soft and nontender. No distention. No abdominal bruits. No CVA tenderness. Genitourinary:  Musculoskeletal: No lower extremity tenderness nor edema.  No joint effusions. Neurologic:  Normal speech and language. No gross focal neurologic deficits are appreciated. No facial droop Skin:  Skin is warm, dry and intact. No rash noted. Psychiatric: Mood and affect are normal. Speech and behavior are normal.  ____________________________________________   LABS (all labs ordered are listed, but only abnormal results are displayed)  Results for orders placed or performed during the hospital encounter of 03/22/20 (from the past 24 hour(s))  Troponin I (High Sensitivity)     Status: None   Collection Time: 03/22/20 10:28 AM  Result Value Ref Range   Troponin I (High Sensitivity) 10 <18 ng/L  Basic metabolic panel     Status: None   Collection Time: 03/22/20 10:40 AM  Result Value Ref Range   Sodium 140 135 - 145 mmol/L   Potassium 3.6 3.5 - 5.1 mmol/L   Chloride 105 98 - 111 mmol/L   CO2 25 22 - 32 mmol/L   Glucose, Bld 98 70 - 99 mg/dL   BUN 12 6 - 20 mg/dL   Creatinine, Ser 9.60 0.44 - 1.00 mg/dL   Calcium 9.0 8.9 - 45.4 mg/dL   GFR, Estimated >09 >81 mL/min   Anion gap 10 5 - 15  CBC     Status: None   Collection Time: 03/22/20 10:40 AM  Result Value Ref Range   WBC 4.9 4.0 - 10.5 K/uL   RBC 4.83 3.87 - 5.11 MIL/uL   Hemoglobin 14.4 12.0 - 15.0 g/dL   HCT 19.1 36 - 46 %   MCV 88.8 80.0 - 100.0 fL   MCH 29.8 26.0 - 34.0 pg   MCHC 33.6 30.0 - 36.0 g/dL   RDW 47.8 29.5 - 62.1 %   Platelets 202 150 - 400 K/uL   nRBC 0.0 0.0 - 0.2 %  Troponin I (High Sensitivity)     Status: None   Collection Time: 03/22/20 10:40 AM  Result Value Ref Range   Troponin I (High Sensitivity)  10 <18 ng/L  Fibrin derivatives D-Dimer ([facility] only)     Status: None   Collection Time: 03/22/20  1:50 PM  Result Value Ref Range   Fibrin derivatives D-dimer ([facility]) 484.79 0.00 - 499.00 ng/mL (FEU)  Troponin I (High Sensitivity)     Status: None   Collection Time: 03/22/20  3:22 PM  Result Value Ref Range   Troponin I (High Sensitivity) 9 <18 ng/L   ____________________________________________  EKG My review and personal interpretation at Time: 10:37   Indication: chest pain Rate: 80  Rhythm: sinus Axis: normal Other: normal intervals, no stemi, no depressions  ____________________________________________  RADIOLOGY  I personally reviewed all radiographic images ordered to evaluate for the above acute complaints and reviewed radiology reports and findings.  These findings were personally discussed with the patient.  Please see medical record for radiology report.  ____________________________________________   PROCEDURES  Procedure(s) performed:  Procedures    Critical Care performed: no ____________________________________________   INITIAL IMPRESSION / ASSESSMENT AND PLAN / ED COURSE  Pertinent labs & imaging results that were available during my care of the patient were reviewed by me and considered in my medical decision making (see chart for details).   DDX: ACS, pericarditis, esophagitis, boerhaaves, pe, dissection, pna, bronchitis, costochondritis   Anne Swanson is a 59 y.o. who presents to the ED with presentation as described above.  She is afebrile hemodynamically stable well-appearing in no acute distress.  EKG is nonischemic.  Initial troponin negative.  Have a low risk for ACS but will order serial enzymes.  She is low risk by Wells criteria D-dimer was negative is not consistent with PE.  Clinically not consistent with dissection.  Abdominal exam is soft and benign.  Doubt reflux or esophagitis.  Symptoms did improve after albuterol treatment and due to  some wheezing on exam patient feels could be consistent with asthma exacerbation.  She is clinically well-appearing so I think would be appropriate for outpatient follow-up with prescription for prednisone.  Have discussed with the patient and available family all diagnostics and treatments performed thus far and all questions were answered to the best of my ability. The patient demonstrates understanding and agreement with plan.     The patient was evaluated in Emergency Department today for the symptoms described in the history of present illness. He/she was evaluated in the context of the global COVID-19 pandemic, which necessitated consideration that the patient might be at risk for infection with the SARS-CoV-2 virus that causes COVID-19. Institutional protocols and algorithms that pertain to the evaluation of patients at risk for COVID-19 are in a state of rapid change based on information released by regulatory bodies including the CDC and federal and state organizations. These policies and algorithms were followed during the patient's care in the ED.  As part of my medical decision making, I reviewed the following data within the electronic MEDICAL RECORD NUMBER Nursing notes reviewed and incorporated, Labs reviewed, notes from prior ED visits and [place] Controlled Substance Database   ____________________________________________   FINAL CLINICAL IMPRESSION(S) / ED DIAGNOSES  Final diagnoses:  Nonspecific chest pain      NEW MEDICATIONS STARTED DURING THIS VISIT:  New Prescriptions   PREDNISONE (DELTASONE) 20 MG TABLET    Take 2 tablets (40 mg total) by mouth daily for 5 days.     Note:  This document was prepared using Dragon voice recognition software and may include unintentional dictation errors.    Willy Eddy, MD 03/22/20 1616

## 2020-03-22 NOTE — ED Triage Notes (Signed)
Pt comes into the ED POV from home with c/o waking around 3am with chest pain, states she laid back down and this morning "I just feel bad, like my chest is tight  Like when my asthma flares up". Pt is in NAD , respiratory WNL

## 2020-03-30 ENCOUNTER — Encounter: Payer: Self-pay | Admitting: Family Medicine

## 2020-03-30 ENCOUNTER — Other Ambulatory Visit: Payer: Self-pay

## 2020-03-30 ENCOUNTER — Ambulatory Visit: Payer: Managed Care, Other (non HMO) | Admitting: Family Medicine

## 2020-03-30 VITALS — BP 116/68 | HR 96 | Temp 97.8°F | Wt 123.0 lb

## 2020-03-30 DIAGNOSIS — J4540 Moderate persistent asthma, uncomplicated: Secondary | ICD-10-CM

## 2020-03-30 DIAGNOSIS — H8111 Benign paroxysmal vertigo, right ear: Secondary | ICD-10-CM | POA: Insufficient documentation

## 2020-03-30 MED ORDER — MECLIZINE HCL 25 MG PO TABS: 25.0000 mg | ORAL_TABLET | Freq: Three times a day (TID) | ORAL | 0 refills | Status: DC | PRN

## 2020-03-30 NOTE — Assessment & Plan Note (Signed)
Recurrent issue No episode in the last few years Symptoms are classic for BPPV No hearing loss Meclizine as needed Referral to PT for vestibular therapy Instructed on Epley maneuver to do at home

## 2020-03-30 NOTE — Assessment & Plan Note (Signed)
Moderate persistent Fairly well controlled, but does still have some chest tightness occasionally Chest pain resolved with prednisone Cardiac cause ruled out Discussed red flags for chest pain Consider increasing Breo dose to 200-25 1 puff daily Continue albuterol as needed Consider Singulair

## 2020-03-30 NOTE — Patient Instructions (Signed)

## 2020-03-30 NOTE — Progress Notes (Signed)
Established patient visit   Patient: Anne Swanson   DOB: 21-Jul-1960   59 y.o. Female  MRN: [id number] Visit Date: 03/30/2020  Today's healthcare provider: Shirlee Latch, MD   Chief Complaint  Patient presents with  . Dizziness  . Follow-up    ER Follow up    Subjective    Dizziness This is a recurrent problem. The current episode started in the past 7 days. The problem occurs constantly. The problem has been unchanged. Associated symptoms include fatigue and nausea. Pertinent negatives include no abdominal pain, chills, diaphoresis, fever, headaches, numbness or vomiting.   HPI    Follow-up     Additional comments: ER Follow up        Last edited by Paschal Dopp, CMA on 03/30/2020  9:17 AM. (History)    PT is requesting a referral to physical therapy for her vertigo.   Follow up ER visit  Patient was seen in ER for chest pain on 03-22-20. She was treated for non-specific chest pain. Treatment for this included Prednisone for five days. She reports excellent compliance with treatment. She reports this condition is Improved.  -----------------------------------------------------------------------------------------   Patient Active Problem List   Diagnosis Date Noted  . Encounter for screening colonoscopy   . Polyp of descending colon   . Calculus of gallbladder without cholecystitis without obstruction 06/29/2017  . Hyperlipidemia 03/01/2015  . Asthma 11/16/2014  . Diffuse cystic mastopathy of breast 11/16/2014  . History of anemia 11/16/2014  . Osteopenia 11/16/2014  . Vitamin D deficiency 11/16/2014   Past Medical History:  Diagnosis Date  . Anemia   . Asthma   . Complication of anesthesia    ITCHING X 30 HOURS AFTER SPINAL 1990  . Fibrocystic breast disease   . High cholesterol   . Migraines   . Osteopenia   . Vertigo    Social History   Tobacco Use  . Smoking status: Never Smoker  . Smokeless tobacco: Never Used  Vaping Use  .  Vaping Use: Never used  Substance Use Topics  . Alcohol use: Yes    Alcohol/week: 0.0 standard drinks    Comment: once a month  . Drug use: No   Allergies  Allergen Reactions  . Lodine [Etodolac] Other (See Comments)    Facial numbness       Medications: Outpatient Medications Prior to Visit  Medication Sig  . albuterol (VENTOLIN HFA) 108 (90 Base) MCG/ACT inhaler INHALE 2 PUFFS BY MOUTH EVERY 6 HOURS AS NEEDED FOR WHEEZE  . fluticasone furoate-vilanterol (BREO ELLIPTA) 100-25 MCG/INH AEPB Inhale 1 puff into the lungs daily.  Marland Kitchen loratadine (CLARITIN) 10 MG tablet Take 10 mg by mouth daily.   No facility-administered medications prior to visit.    Review of Systems  Constitutional: Positive for appetite change and fatigue. Negative for activity change, chills, diaphoresis, fever and unexpected weight change.  Respiratory: Negative.   Cardiovascular: Negative.   Gastrointestinal: Positive for nausea. Negative for abdominal distention, abdominal pain, anal bleeding, blood in stool, constipation, diarrhea, rectal pain and vomiting.  Neurological: Positive for dizziness. Negative for syncope, facial asymmetry, speech difficulty, light-headedness, numbness and headaches.      Objective    BP 116/68 (BP Location: Right Arm, Patient Position: Sitting, Cuff Size: Normal)   Pulse 96   Temp 97.8 F (36.6 C) (Oral)   Wt 123 lb (55.8 kg)   LMP 02/15/2014   BMI 22.50 kg/m    Physical Exam Vitals reviewed.  Constitutional:      General: She is not in acute distress.    Appearance: Normal appearance. She is well-developed. She is not diaphoretic.  HENT:     Head: Normocephalic and atraumatic.  Eyes:     General: No scleral icterus.    Extraocular Movements: Extraocular movements intact.     Conjunctiva/sclera: Conjunctivae normal.     Comments: No nystagmus, but lateral gaze does trigger vertigo  Neck:     Thyroid: No thyromegaly.  Cardiovascular:     Rate and Rhythm:  Normal rate and regular rhythm.     Pulses: Normal pulses.     Heart sounds: Normal heart sounds. No murmur heard.   Pulmonary:     Effort: Pulmonary effort is normal. No respiratory distress.     Breath sounds: Normal breath sounds. No wheezing, rhonchi or rales.  Musculoskeletal:     Cervical back: Neck supple.     Right lower leg: No edema.     Left lower leg: No edema.  Lymphadenopathy:     Cervical: No cervical adenopathy.  Skin:    General: Skin is warm and dry.     Findings: No rash.  Neurological:     Mental Status: She is alert and oriented to person, place, and time. Mental status is at baseline.  Psychiatric:        Mood and Affect: Mood normal.        Behavior: Behavior normal.       No results found for any visits on 03/30/20.  Assessment & Plan     Problem List Items Addressed This Visit      Respiratory   Asthma - Primary    Moderate persistent Fairly well controlled, but does still have some chest tightness occasionally Chest pain resolved with prednisone Cardiac cause ruled out Discussed red flags for chest pain Consider increasing Breo dose to 200-25 1 puff daily Continue albuterol as needed Consider Singulair        Nervous and Auditory   Benign paroxysmal positional vertigo of right ear    Recurrent issue No episode in the last few years Symptoms are classic for BPPV No hearing loss Meclizine as needed Referral to PT for vestibular therapy Instructed on Epley maneuver to do at home      Relevant Orders   Ambulatory referral to Physical Therapy       Return for as scheduled.      I, Shirlee Latch, MD, have reviewed all documentation for this visit. The documentation on 03/30/20 for the exam, diagnosis, procedures, and orders are all accurate and complete.   Dalphine Cowie, Marzella Schlein, MD, MPH [facility]

## 2020-07-20 ENCOUNTER — Other Ambulatory Visit: Payer: Self-pay

## 2020-07-20 ENCOUNTER — Encounter: Payer: Self-pay | Admitting: Family Medicine

## 2020-07-20 ENCOUNTER — Ambulatory Visit (INDEPENDENT_AMBULATORY_CARE_PROVIDER_SITE_OTHER): Payer: Managed Care, Other (non HMO) | Admitting: Family Medicine

## 2020-07-20 VITALS — BP 130/63 | HR 76 | Temp 98.5°F | Wt 130.8 lb

## 2020-07-20 DIAGNOSIS — E782 Mixed hyperlipidemia: Secondary | ICD-10-CM

## 2020-07-20 DIAGNOSIS — J4540 Moderate persistent asthma, uncomplicated: Secondary | ICD-10-CM

## 2020-07-20 DIAGNOSIS — N952 Postmenopausal atrophic vaginitis: Secondary | ICD-10-CM | POA: Diagnosis not present

## 2020-07-20 MED ORDER — ESTRADIOL 0.1 MG/GM VA CREA: 1.0000 | TOPICAL_CREAM | VAGINAL | 12 refills | Status: DC

## 2020-07-20 NOTE — Assessment & Plan Note (Signed)
Reviewed last lipids Plan to check at next CPE Not on statin

## 2020-07-20 NOTE — Assessment & Plan Note (Signed)
Chronic and well controlled Stopped controller and doing well Will continue to monitor If starts having more exacerbations will need to resume Albuterol prn

## 2020-07-20 NOTE — Assessment & Plan Note (Signed)
New problem Postmenopausal with pain with intercourse Trial of topical estrogen 3 x weekly

## 2020-07-20 NOTE — Patient Instructions (Addendum)
The CDC recommends two doses of Shingrix (the shingles vaccine) separated by 2 to 6 months for adults age 60 years and older. I recommend checking with your insurance plan regarding coverage for this vaccine.      Atrophic Vaginitis  Atrophic vaginitis is a condition in which the tissues that line the vagina become dry and thin. This condition is most common in women who have stopped having regular menstrual periods (are in menopause). This usually starts when a woman is 13 to 60 years old. That is the time when a woman's estrogen levels begin to decrease. Estrogen is a female hormone. It helps to keep the tissues of the vagina moist. It stimulates the vagina to produce a clear fluid that lubricates the vagina for sex. This fluid also protects the vagina from infection. Lack of estrogen can cause the lining of the vagina to get thinner and dryer. The vagina may also shrink in size. It may become less elastic. Atrophic vaginitis tends to get worse over time as a woman's estrogen level drops. What are the causes? This condition is caused by the normal drop in estrogen that happens around the time of menopause. What increases the risk? Certain conditions or situations may lower a woman's estrogen level, leading to a higher risk for atrophic vaginitis. You are more likely to develop this condition if:  You are taking medicines that block estrogen.  You have had your ovaries removed.  You are being treated for cancer with radiation or medicines (chemotherapy).  You have given birth or are breastfeeding.  You are older than age 41.  You smoke. What are the signs or symptoms? Symptoms of this condition include:  Pain, soreness, a feeling of pressure, or bleeding during sex (dyspareunia).  Vaginal burning, irritation, or itching.  Pain or bleeding when a speculum is used in a vaginal exam.  Having burning pain while urinating.  Vaginal discharge. In some cases, there are no  symptoms. How is this diagnosed? This condition is diagnosed based on your medical history and a physical exam. This will include a pelvic exam that checks the vaginal tissues. Though rare, you may also have other tests, including:  A urine test.  A test that checks the acid balance in your vagina (acid balance test). How is this treated? Treatment for this condition depends on how severe your symptoms are. Treatment may include:  Using an over-the-counter vaginal lubricant before sex.  Using a long-acting vaginal moisturizer.  Using low-dose estrogen for moderate to severe symptoms that do not respond to other treatments. Options include creams, tablets, and inserts (vaginal rings). Before you use a vaginal estrogen, tell your health care provider if you have a history of: ? Breast cancer. ? Endometrial cancer. ? Blood clots. If you are not sexually active and your symptoms are very mild, you may not need treatment. Follow these instructions at home: Medicines  Take over-the-counter and prescription medicines only as told by your health care provider.  Do not use herbal or alternative medicines unless your health care provider says that you can.  Use over-the-counter creams, lubricants, or moisturizers for dryness only as told by your health care provider. General instructions  If your atrophic vaginitis is caused by menopause, discuss all of your menopause symptoms and treatment options with your health care provider.  Do not douche.  Do not use products that can make your vagina dry. These include: ? Scented feminine sprays. ? Scented tampons. ? Scented soaps.  Vaginal sex  can help to improve blood flow and elasticity of vaginal tissue. If you choose to have sex and it hurts, try using a water-soluble lubricant or moisturizer right before having sex. Contact a health care provider if:  Your discharge looks different than normal.  Your vagina has an unusual smell.  You  have new symptoms.  Your symptoms do not improve with treatment.  Your symptoms get worse. Summary  Atrophic vaginitis is a condition in which the tissues that line the vagina become dry and thin. It is most common in women who have stopped having regular menstrual periods (are in menopause).  Treatment options include using vaginal lubricants and low-dose vaginal estrogen.  Contact a health care provider if your vagina has an unusual smell, or if your symptoms get worse or do not improve after treatment. This information is not intended to replace advice given to you by your health care provider. Make sure you discuss any questions you have with your health care provider. Document Revised: 10/02/2019 Document Reviewed: 10/02/2019 Elsevier Patient Education  2021 [employer].

## 2020-07-20 NOTE — Progress Notes (Signed)
Established patient visit   Patient: Anne Swanson   DOB: [birth year]   60 y.o. Female  MRN: [id number] Visit Date: 07/20/2020  Today's healthcare provider: Shirlee Latch, MD   Chief Complaint  Patient presents with  . Asthma   I,Porsha C McClurkin,acting as a scribe for Shirlee Latch, MD.,have documented all relevant documentation on the behalf of Shirlee Latch, MD,as directed by  Shirlee Latch, MD while in the presence of Shirlee Latch, MD.  Subjective    HPI  Follow up for Asthma  The patient was last seen for this 3 months ago. Changes made at last visit include started on breo inhaler.  She reports good compliance with treatment. She feels that condition is Improved. She is not having side effects.   Has not had in last month as breathing is much improved.  -----------------------------------------------------------------------------------------    Social History   Tobacco Use  . Smoking status: Never Smoker  . Smokeless tobacco: Never Used  Vaping Use  . Vaping Use: Never used  Substance Use Topics  . Alcohol use: Yes    Alcohol/week: 0.0 standard drinks    Comment: once a month  . Drug use: No       Medications: Outpatient Medications Prior to Visit  Medication Sig  . albuterol (VENTOLIN HFA) 108 (90 Base) MCG/ACT inhaler INHALE 2 PUFFS BY MOUTH EVERY 6 HOURS AS NEEDED FOR WHEEZE  . loratadine (CLARITIN) 10 MG tablet Take 10 mg by mouth daily.  . meclizine (ANTIVERT) 25 MG tablet Take 1 tablet (25 mg total) by mouth 3 (three) times daily as needed for dizziness.  . [DISCONTINUED] fluticasone furoate-vilanterol (BREO ELLIPTA) 100-25 MCG/INH AEPB Inhale 1 puff into the lungs daily.   No facility-administered medications prior to visit.    Review of Systems  Constitutional: Negative.   Respiratory: Negative.  Negative for cough, chest tightness, shortness of breath and wheezing.        Objective    BP 130/63 (BP  Location: Left Arm, Patient Position: Sitting, Cuff Size: Normal)   Pulse 76   Temp 98.5 F (36.9 C) (Oral)   Wt 130 lb 12.8 oz (59.3 kg)   LMP 02/15/2014   SpO2 98%   BMI 23.92 kg/m       Physical Exam Vitals reviewed.  Constitutional:      General: She is not in acute distress.    Appearance: Normal appearance. She is well-developed. She is not diaphoretic.  HENT:     Head: Normocephalic and atraumatic.  Eyes:     General: No scleral icterus.    Conjunctiva/sclera: Conjunctivae normal.  Neck:     Thyroid: No thyromegaly.  Cardiovascular:     Rate and Rhythm: Normal rate and regular rhythm.     Pulses: Normal pulses.     Heart sounds: Normal heart sounds. No murmur heard.   Pulmonary:     Effort: Pulmonary effort is normal. No respiratory distress.     Breath sounds: Normal breath sounds. No wheezing, rhonchi or rales.  Musculoskeletal:     Cervical back: Neck supple.     Right lower leg: No edema.     Left lower leg: No edema.  Lymphadenopathy:     Cervical: No cervical adenopathy.  Skin:    General: Skin is warm and dry.     Findings: No rash.  Neurological:     Mental Status: She is alert and oriented to person, place, and time. Mental status is at baseline.  Psychiatric:        Mood and Affect: Mood normal.        Behavior: Behavior normal.       No results found for any visits on 07/20/20.  Assessment & Plan     Problem List Items Addressed This Visit      Respiratory   Asthma - Primary    Chronic and well controlled Stopped controller and doing well Will continue to monitor If starts having more exacerbations will need to resume Albuterol prn        Genitourinary   Atrophic vaginitis    New problem Postmenopausal with pain with intercourse Trial of topical estrogen 3 x weekly        Other   Hyperlipidemia    Reviewed last lipids Plan to check at next CPE Not on statin          Return in about 6 months (around 01/19/2021) for  CPE.      I, Shirlee Latch, MD, have reviewed all documentation for this visit. The documentation on 07/20/20 for the exam, diagnosis, procedures, and orders are all accurate and complete.   Jencarlo Bonadonna, Marzella Schlein, MD, MPH [facility]

## 2020-08-03 ENCOUNTER — Ambulatory Visit: Payer: Managed Care, Other (non HMO) | Admitting: Family Medicine

## 2021-02-03 ENCOUNTER — Encounter: Payer: Self-pay | Admitting: Family Medicine

## 2021-02-03 ENCOUNTER — Ambulatory Visit (INDEPENDENT_AMBULATORY_CARE_PROVIDER_SITE_OTHER): Payer: Managed Care, Other (non HMO) | Admitting: Family Medicine

## 2021-02-03 ENCOUNTER — Other Ambulatory Visit: Payer: Self-pay

## 2021-02-03 VITALS — BP 118/73 | HR 66 | Temp 98.4°F | Ht 62.0 in | Wt 127.7 lb

## 2021-02-03 DIAGNOSIS — J4540 Moderate persistent asthma, uncomplicated: Secondary | ICD-10-CM | POA: Diagnosis not present

## 2021-02-03 DIAGNOSIS — Z23 Encounter for immunization: Secondary | ICD-10-CM

## 2021-02-03 DIAGNOSIS — E782 Mixed hyperlipidemia: Secondary | ICD-10-CM | POA: Diagnosis not present

## 2021-02-03 DIAGNOSIS — Z1231 Encounter for screening mammogram for malignant neoplasm of breast: Secondary | ICD-10-CM

## 2021-02-03 DIAGNOSIS — R739 Hyperglycemia, unspecified: Secondary | ICD-10-CM

## 2021-02-03 DIAGNOSIS — M8580 Other specified disorders of bone density and structure, unspecified site: Secondary | ICD-10-CM

## 2021-02-03 DIAGNOSIS — Z0000 Encounter for general adult medical examination without abnormal findings: Secondary | ICD-10-CM | POA: Diagnosis not present

## 2021-02-03 DIAGNOSIS — K219 Gastro-esophageal reflux disease without esophagitis: Secondary | ICD-10-CM

## 2021-02-03 DIAGNOSIS — E559 Vitamin D deficiency, unspecified: Secondary | ICD-10-CM

## 2021-02-03 DIAGNOSIS — Z862 Personal history of diseases of the blood and blood-forming organs and certain disorders involving the immune mechanism: Secondary | ICD-10-CM

## 2021-02-03 NOTE — Progress Notes (Signed)
Complete physical exam   Patient: Anne Swanson   DOB: Dec 21, 1960   60 y.o. Female  MRN: [id number] Visit Date: 02/03/2021  Today's healthcare provider: Shirlee Latch, MD   Chief Complaint  Patient presents with   Annual Exam   Subjective    Anne Swanson is a 60 y.o. female who presents today for a complete physical exam.  She reports consuming a general diet.  She reports before it began to get cold out she was walking 5 miles daily but is now on walking 1 mile daily  She generally feels well. She reports sleeping well. She does have additional problems to discuss today.    HPI  -Last CPE 02/03/20 -Pap Smear due 03/12/22 -Mammogram due 03/17/22 -Colonoscopy due 03/02/25  -Reports acid reflux for the last month causing patient to have a poor appetite. Patient props up in order to sleep. Reports taking tums. Tried to go without caffeine or soda - good relief  -Patient would like to receive flu vaccine -Would like zoster vaccine at next   Lipid/Cholesterol, Follow-up  Last lipid panel Other pertinent labs  Lab Results  Component Value Date   CHOL 223 (H) 02/03/2020   HDL 60 02/03/2020   LDLCALC 153 (H) 02/03/2020   TRIG 58 02/03/2020   CHOLHDL 3.7 02/03/2020   Lab Results  Component Value Date   ALT 12 02/03/2020   AST 17 02/03/2020   PLT 202 03/22/2020   TSH 0.872 11/16/2014     She was last seen for this 6 months ago.  Management since that visit includes no medications.  She reports  no  compliance with treatment. (Not on statin) She is not having side effects.   Symptoms: No chest pain No chest pressure/discomfort  No dyspnea Yes lower extremity edema (LL)  Yes numbness or tingling of extremity (fingers on both hands) No orthopnea  No palpitations No paroxysmal nocturnal dyspnea  No speech difficulty No syncope   Current diet: in general, a "healthy" diet  , well balanced Current exercise: walking  The 10-year ASCVD risk score (Arnett  DK, et al., 2019) is: 2.9%  ---------------------------------------------------------------------------------------------------   Past Medical History:  Diagnosis Date   Anemia    Asthma    Complication of anesthesia    ITCHING X 30 HOURS AFTER SPINAL 1990   Fibrocystic breast disease    High cholesterol    Migraines    Osteopenia    Vertigo    Past Surgical History:  Procedure Laterality Date   CESAREAN SECTION     x3   CHOLECYSTECTOMY N/A 08/06/2017   Procedure: LAPAROSCOPIC CHOLECYSTECTOMY WITH INTRAOPERATIVE CHOLANGIOGRAM;  Surgeon: Earline Mayotte, MD;  Location: [facility] ORS;  Service: General;  Laterality: N/A;   COLONOSCOPY     COLONOSCOPY WITH PROPOFOL N/A 03/02/2020   Procedure: COLONOSCOPY WITH PROPOFOL;  Surgeon: Midge Minium, MD;  Location: [facility] ENDOSCOPY;  Service: Endoscopy;  Laterality: N/A;   WISDOM TOOTH EXTRACTION     Social History   Socioeconomic History   Marital status: Married    Spouse name: Not on file   Number of children: 3   Years of education: Not on file   Highest education level: Not on file  Occupational History   Occupation: Accountant    Comment: Self employed  Tobacco Use   Smoking status: Never   Smokeless tobacco: Never  Vaping Use   Vaping Use: Never used  Substance and Sexual Activity   Alcohol use: Yes  Alcohol/week: 0.0 standard drinks    Comment: once a month   Drug use: No   Sexual activity: Yes  Other Topics Concern   Not on file  Social History Narrative   Not on file   Social Determinants of Health   Financial Resource Strain: Not on file  Food Insecurity: Not on file  Transportation Needs: Not on file  Physical Activity: Not on file  Stress: Not on file  Social Connections: Not on file  Intimate Partner Violence: Not on file   Family Status  Relation Name Status   Mother  Deceased at age 52       Breast cancer and colon cancer   Father  Deceased at age 44       died from MI   Brother  Deceased at age  74       fdied from Leukemia   Mat Uncle  (Not Specified)   Other Mat Programme researcher, broadcasting/film/video Deceased   Family History  Problem Relation Age of Onset   Colon cancer Mother    Breast cancer Mother    Coronary artery disease Father    Schizophrenia Father    Leukemia Brother    Stomach cancer Maternal Uncle    Breast cancer Other    Allergies  Allergen Reactions   Lodine [Etodolac] Other (See Comments)    Facial numbness    Patient Care Team: Erasmo Downer, MD as PCP - General (Family Medicine)   Medications: Outpatient Medications Prior to Visit  Medication Sig   albuterol (VENTOLIN HFA) 108 (90 Base) MCG/ACT inhaler INHALE 2 PUFFS BY MOUTH EVERY 6 HOURS AS NEEDED FOR WHEEZE   loratadine (CLARITIN) 10 MG tablet Take 10 mg by mouth daily.   meclizine (ANTIVERT) 25 MG tablet Take 1 tablet (25 mg total) by mouth 3 (three) times daily as needed for dizziness.   [DISCONTINUED] estradiol (ESTRACE VAGINAL) 0.1 MG/GM vaginal cream Place 1 Applicatorful vaginally 3 (three) times a week.   No facility-administered medications prior to visit.    Review of Systems  Constitutional:  Positive for appetite change.  HENT:  Positive for rhinorrhea.   Cardiovascular:  Positive for leg swelling.  Musculoskeletal:  Positive for arthralgias.  All other systems reviewed and are negative.  Last CBC Lab Results  Component Value Date   WBC 4.9 03/22/2020   HGB 14.4 03/22/2020   HCT 42.9 03/22/2020   MCV 88.8 03/22/2020   MCH 29.8 03/22/2020   RDW 12.4 03/22/2020   PLT 202 03/22/2020   Last metabolic panel Lab Results  Component Value Date   GLUCOSE 98 03/22/2020   NA 140 03/22/2020   K 3.6 03/22/2020   CL 105 03/22/2020   CO2 25 03/22/2020   BUN 12 03/22/2020   CREATININE 0.69 03/22/2020   GFRNONAA >60 03/22/2020   CALCIUM 9.0 03/22/2020   PROT 6.6 02/03/2020   ALBUMIN 4.3 02/03/2020   LABGLOB 2.3 02/03/2020   AGRATIO 1.9 02/03/2020   BILITOT 0.5 02/03/2020   ALKPHOS 92  02/03/2020   AST 17 02/03/2020   ALT 12 02/03/2020   ANIONGAP 10 03/22/2020   Last lipids Lab Results  Component Value Date   CHOL 223 (H) 02/03/2020   HDL 60 02/03/2020   LDLCALC 153 (H) 02/03/2020   TRIG 58 02/03/2020   CHOLHDL 3.7 02/03/2020   Last hemoglobin A1c No results found for: HGBA1C Last thyroid functions Lab Results  Component Value Date   TSH 0.872 11/16/2014   T4TOTAL 7.6 11/16/2014  Last vitamin D Lab Results  Component Value Date   VD25OH 18.9 (L) 02/03/2020   Last vitamin B12 and Folate No results found for: VITAMINB12, FOLATE    Objective    BP 118/73 (BP Location: Right Arm, Patient Position: Sitting, Cuff Size: Large)   Pulse 66   Temp 98.4 F (36.9 C) (Oral)   Ht 5\' 2"  (1.575 m)   Wt 127 lb 11.2 oz (57.9 kg)   LMP 02/15/2014   SpO2 98%   BMI 23.36 kg/m  BP Readings from Last 3 Encounters:  02/03/21 118/73  07/20/20 130/63  03/30/20 116/68   Wt Readings from Last 3 Encounters:  02/03/21 127 lb 11.2 oz (57.9 kg)  07/20/20 130 lb 12.8 oz (59.3 kg)  03/30/20 123 lb (55.8 kg)      Physical Exam Vitals reviewed.  Constitutional:      General: She is not in acute distress.    Appearance: Normal appearance. She is well-developed. She is not diaphoretic.  HENT:     Head: Normocephalic and atraumatic.     Right Ear: Tympanic membrane, ear canal and external ear normal.     Left Ear: Tympanic membrane, ear canal and external ear normal.     Nose: Nose normal.     Mouth/Throat:     Mouth: Mucous membranes are moist.     Pharynx: Oropharynx is clear. No oropharyngeal exudate.  Eyes:     General: No scleral icterus.    Conjunctiva/sclera: Conjunctivae normal.     Pupils: Pupils are equal, round, and reactive to light.  Neck:     Thyroid: No thyromegaly.  Cardiovascular:     Rate and Rhythm: Normal rate and regular rhythm.     Pulses: Normal pulses.     Heart sounds: Normal heart sounds. No murmur heard. Pulmonary:     Effort:  Pulmonary effort is normal. No respiratory distress.     Breath sounds: Normal breath sounds. No wheezing or rales.  Abdominal:     General: There is no distension.     Palpations: Abdomen is soft.     Tenderness: There is no abdominal tenderness.  Musculoskeletal:        General: No deformity.     Cervical back: Neck supple.     Right lower leg: No edema.     Left lower leg: No edema.  Lymphadenopathy:     Cervical: No cervical adenopathy.  Skin:    General: Skin is warm and dry.     Findings: No rash.  Neurological:     Mental Status: She is alert and oriented to person, place, and time. Mental status is at baseline.     Sensory: No sensory deficit.     Motor: No weakness.     Gait: Gait normal.  Psychiatric:        Mood and Affect: Mood normal.        Behavior: Behavior normal.        Thought Content: Thought content normal.      Last depression screening scores PHQ 2/9 Scores 02/03/2021 07/20/2020 03/30/2020  PHQ - 2 Score 0 0 0  PHQ- 9 Score 3 1 0   Last fall risk screening Fall Risk  02/03/2021  Falls in the past year? 1  Number falls in past yr: 0  Injury with Fall? 0  Risk for fall due to : Other (Comment)  Risk for fall due to: Comment was walking dog and pulled leash too hard  Follow up -  Last Audit-C alcohol use screening Alcohol Use Disorder Test (AUDIT) 02/03/2021  1. How often do you have a drink containing alcohol? 2  2. How many drinks containing alcohol do you have on a typical day when you are drinking? 0  3. How often do you have six or more drinks on one occasion? 0  AUDIT-C Score 2  Alcohol Brief Interventions/Follow-up -   A score of 3 or more in women, and 4 or more in men indicates increased risk for alcohol abuse, EXCEPT if all of the points are from question 1   No results found for any visits on 02/03/21.  Assessment & Plan    Routine Health Maintenance and Physical Exam  Exercise Activities and Dietary recommendations  Goals    None     Immunization History  Administered Date(s) Administered   Influenza,inj,Quad PF,6+ Mos 03/12/2017, 01/05/2020, 02/03/2021   PFIZER Comirnaty(Gray Top)Covid-19 Tri-Sucrose Vaccine 07/22/2020   PFIZER(Purple Top)SARS-COV-2 Vaccination 06/27/2019, 07/22/2019, 02/09/2020   Pneumococcal Polysaccharide-23 01/05/2020   Tdap 03/19/2007, 01/05/2020    Health Maintenance  Topic Date Due   Zoster Vaccines- Shingrix (1 of 2) Never done   COVID-19 Vaccine (5 - Booster for Pfizer series) 09/16/2020   Pneumococcal Vaccine 36-68 Years old (2 - PCV) 01/04/2021   PAP SMEAR-Modifier  03/12/2022   MAMMOGRAM  03/17/2022   COLONOSCOPY (Pts 45-51yrs Insurance coverage will need to be confirmed)  03/02/2025   INFLUENZA VACCINE  Completed   Hepatitis C Screening  Completed   HIV Screening  Completed   HPV VACCINES  Aged Out   TETANUS/TDAP  Discontinued    Discussed health benefits of physical activity, and encouraged her to engage in regular exercise appropriate for her age and condition.  Problem List Items Addressed This Visit       Respiratory   Asthma    Chronic and well-controlled No changes to medications today        Digestive   Gastroesophageal reflux disease without esophagitis    New problem Suspect its diet related Discussed not eating for 2 hours before going to bed Drink plenty of water with eating and food choices Continue Tums as needed Consider PPI in the future        Musculoskeletal and Integument   Osteopenia    Reviewed recent DEXA        Other   History of anemia    Recheck CBC      Vitamin D deficiency   Hyperlipidemia    Reviewed last lipid panel Not currently on a statin Recheck FLP and CMP Discussed diet and exercise       Other Visit Diagnoses     Encounter for annual physical exam    -  Primary   Relevant Orders   CBC with Differential/Platelet   Vitamin D (25 hydroxy)   Lipid panel   Comprehensive metabolic panel    Hemoglobin A1c   Flu vaccine need       Need for immunization against influenza       Relevant Orders   Flu Vaccine QUAD 48mo+IM (Fluarix, Fluzone & Alfiuria Quad PF) (Completed)   Screening mammogram for breast cancer       Relevant Orders   MM 3D SCREEN BREAST BILATERAL   Hyperglycemia       Relevant Orders   Hemoglobin A1c        Return in about 1 year (around 02/03/2022) for CPE.     I, Shirlee Latch, MD, have reviewed all documentation  for this visit. The documentation on 02/04/21 for the exam, diagnosis, procedures, and orders are all accurate and complete.   Neale Marzette, Marzella Schlein, MD, MPH [facility]

## 2021-02-04 NOTE — Assessment & Plan Note (Signed)
New problem Suspect its diet related Discussed not eating for 2 hours before going to bed Drink plenty of water with eating and food choices Continue Tums as needed Consider PPI in the future

## 2021-02-04 NOTE — Assessment & Plan Note (Signed)
Chronic and well-controlled No changes to medications today

## 2021-02-04 NOTE — Assessment & Plan Note (Signed)
Reviewed recent DEXA.  

## 2021-02-04 NOTE — Assessment & Plan Note (Signed)
Recheck CBC. 

## 2021-02-04 NOTE — Assessment & Plan Note (Signed)
Reviewed last lipid panel Not currently on a statin Recheck FLP and CMP Discussed diet and exercise  

## 2021-05-21 IMAGING — CR DG CHEST 2V
1 series · 2 of 2 positions shown · non-contrast
Comparison: 12/11/2019

CLINICAL DATA: Chest pain for 1 day radiating down the left arm.

EXAM:
CHEST - 2 VIEW

[Series 1: w chest pa · 0.14mm/px · 2 of 2 slices shown]
[im 1/2]
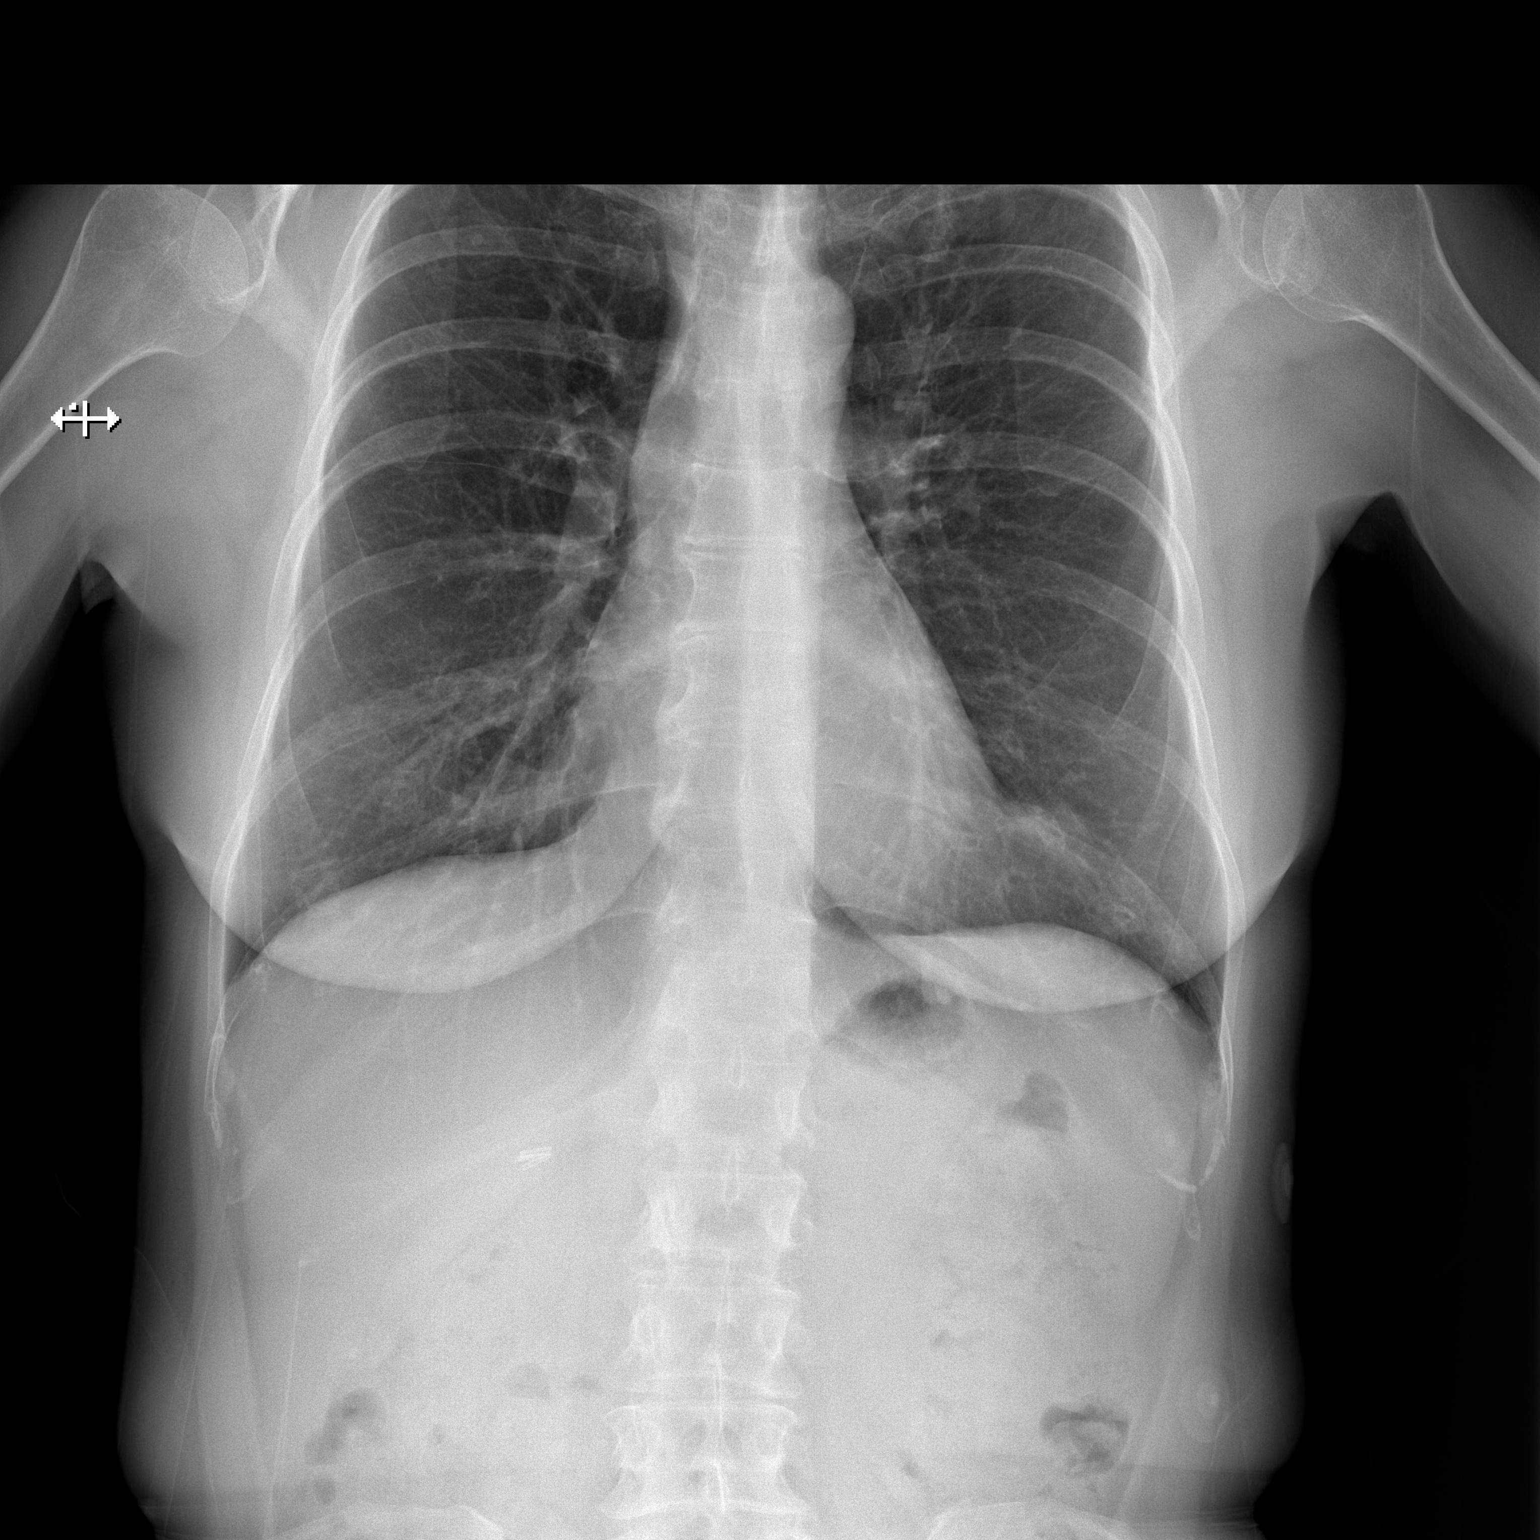
[im 2/2]
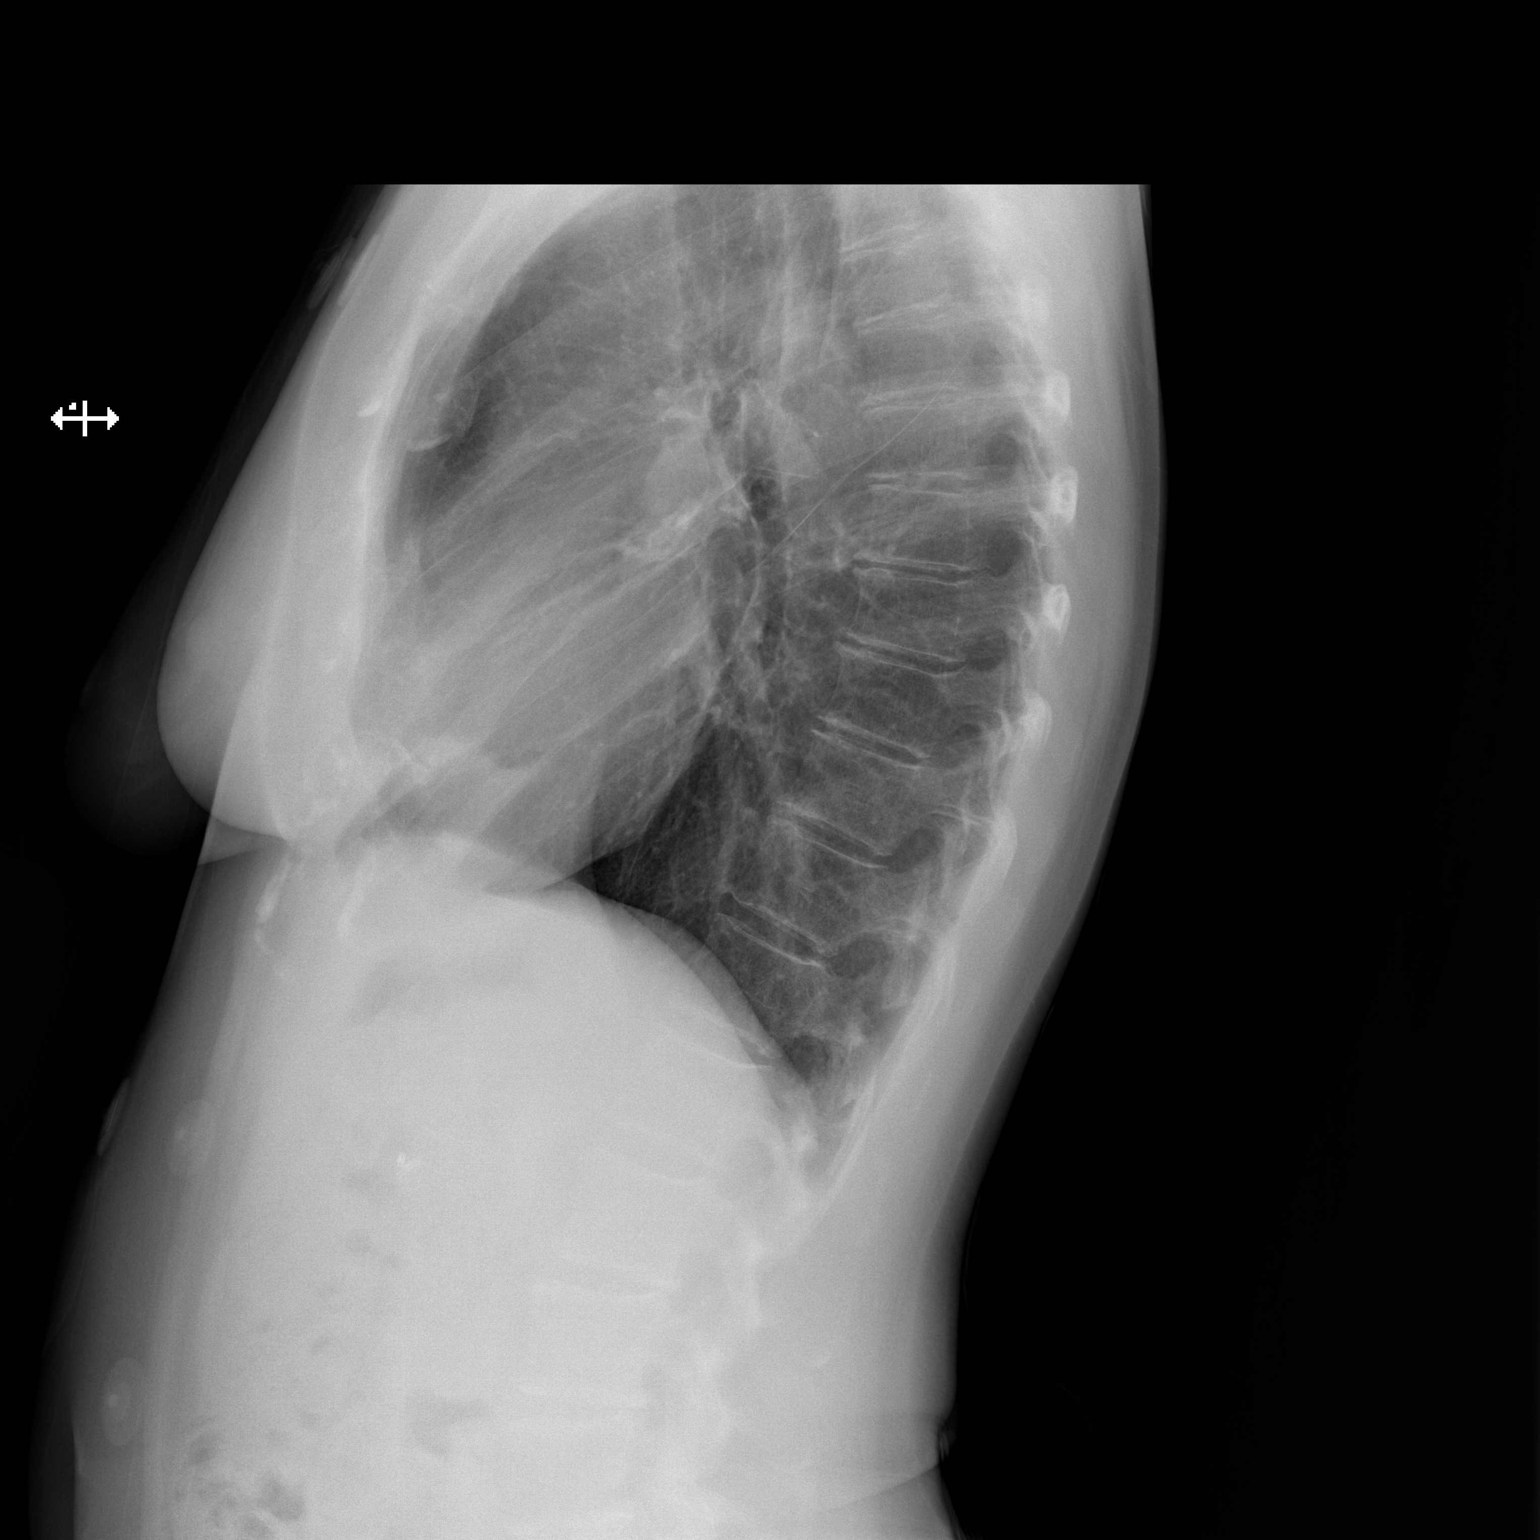

[2 of 2 positions shown; findings below may reference images not displayed]

FINDINGS: The cardiomediastinal silhouette is unchanged with normal heart
size. The lungs are well inflated. Mild chronic right middle lobe
opacity likely reflect scarring. No acute airspace consolidation,
edema, pleural effusion, or pneumothorax is identified. Right upper
quadrant abdominal surgical clips are noted. No acute osseous
abnormality is seen.
IMPRESSION: No active cardiopulmonary disease.

## 2021-06-08 ENCOUNTER — Other Ambulatory Visit: Payer: Self-pay

## 2021-06-08 ENCOUNTER — Ambulatory Visit
Admission: RE | Admit: 2021-06-08 | Discharge: 2021-06-08 | Disposition: A | Discharge status: Home / Self Care | Payer: Managed Care, Other (non HMO) | Source: Ambulatory Visit | Attending: Family Medicine | Admitting: Family Medicine

## 2021-06-08 DIAGNOSIS — Z1231 Encounter for screening mammogram for malignant neoplasm of breast: Secondary | ICD-10-CM | POA: Diagnosis not present

## 2021-09-14 NOTE — Progress Notes (Unsigned)
MyChart Video Visit  I,Cyril Woodmansee E Elijha Dedman,acting as a scribe for Shirlee Latch, MD.,have documented all relevant documentation on the behalf of Shirlee Latch, MD,as directed by  Shirlee Latch, MD while in the presence of Shirlee Latch, MD.    Virtual Visit via Video Note   This visit type was conducted due to national recommendations for restrictions regarding the COVID-19 Pandemic (e.g. social distancing) in an effort to limit this patient's exposure and mitigate transmission in our community. This patient is at least at moderate risk for complications without adequate follow up. This format is felt to be most appropriate for this patient at this time. Physical exam was limited by quality of the video and audio technology used for the visit.    Patient location: home Provider location: [facility] Persons involved in the visit: patient, provider  I discussed the limitations of evaluation and management by telemedicine and the availability of in person appointments. The patient expressed understanding and agreed to proceed.  Patient: Anne Swanson   DOB: [birth year]   61 y.o. Female  MRN: [id number] Visit Date: 09/16/2021  Today's healthcare provider: Shirlee Latch, MD   Chief Complaint  Patient presents with   URI   Subjective    URI  This is a new problem. The current episode started in the past 7 days (Started on Friday.). There has been no fever (She did had a fever of 100.2 on Friday.). Associated symptoms include congestion, coughing, headaches, rhinorrhea and wheezing ("some"). Pertinent negatives include no abdominal pain, chest pain, ear pain, nausea, neck pain, sinus pain or sneezing. Sore throat: some days in the morning.Associated symptoms comments: Laryngitis,chest congestion. She has tried antihistamine, decongestant and inhaler use for the symptoms.     Is starting to feel better. Worse day was 2 days ago.  Cough is still  productive.  Worried about asthma.   Medications: Outpatient Medications Prior to Visit  Medication Sig   albuterol (VENTOLIN HFA) 108 (90 Base) MCG/ACT inhaler INHALE 2 PUFFS BY MOUTH EVERY 6 HOURS AS NEEDED FOR WHEEZE   loratadine (CLARITIN) 10 MG tablet Take 10 mg by mouth daily.   meclizine (ANTIVERT) 25 MG tablet Take 1 tablet (25 mg total) by mouth 3 (three) times daily as needed for dizziness.   No facility-administered medications prior to visit.    Review of Systems  HENT:  Positive for congestion and rhinorrhea. Negative for ear pain, sinus pain and sneezing. Sore throat: some days in the morning.  Respiratory:  Positive for cough and wheezing ("some").   Cardiovascular:  Negative for chest pain.  Gastrointestinal:  Negative for abdominal pain and nausea.  Musculoskeletal:  Negative for neck pain.  Neurological:  Positive for headaches.      Objective    LMP 02/15/2014      Physical Exam Constitutional:      General: She is not in acute distress.    Appearance: Normal appearance.  HENT:     Head: Normocephalic.  Pulmonary:     Effort: Pulmonary effort is normal. No respiratory distress.  Neurological:     Mental Status: She is alert and oriented to person, place, and time. Mental status is at baseline.       Assessment & Plan     1. Viral URI with cough - symptoms and exam c/w viral URI - no evidence of strep pharyngitis, CAP, AOM, bacterial sinusitis, or other bacterial infection - discussed symptomatic management, natural course, and return precautions   2.  Moderate persistent asthma with acute exacerbation - recurrent issue - often after URIs - having more wheezing - will treat with Prednisone - discussed red flags/return precautions  Meds ordered this encounter  Medications   predniSONE (DELTASONE) 20 MG tablet    Sig: Take 2 tablets (40 mg total) by mouth daily with breakfast for 5 days.    Dispense:  10 tablet    Refill:  0     Return if  symptoms worsen or fail to improve.     I discussed the assessment and treatment plan with the patient. The patient was provided an opportunity to ask questions and all were answered. The patient agreed with the plan and demonstrated an understanding of the instructions.   The patient was advised to call back or seek an in-person evaluation if the symptoms worsen or if the condition fails to improve as anticipated.  I, Shirlee Latch, MD, have reviewed all documentation for this visit. The documentation on 09/16/21 for the exam, diagnosis, procedures, and orders are all accurate and complete.   Bacigalupo, Marzella Schlein, MD, MPH [facility]

## 2021-09-16 ENCOUNTER — Telehealth: Payer: Managed Care, Other (non HMO) | Admitting: Family Medicine

## 2021-09-16 ENCOUNTER — Encounter: Payer: Self-pay | Admitting: Family Medicine

## 2021-09-16 DIAGNOSIS — J069 Acute upper respiratory infection, unspecified: Secondary | ICD-10-CM

## 2021-09-16 DIAGNOSIS — J4541 Moderate persistent asthma with (acute) exacerbation: Secondary | ICD-10-CM | POA: Diagnosis not present

## 2021-09-16 MED ORDER — PREDNISONE 20 MG PO TABS: 40.0000 mg | ORAL_TABLET | Freq: Every day | ORAL | 0 refills | Status: AC

## 2021-10-11 LAB — LIPID PANEL
Chol/HDL Ratio: 4.6 ratio — ABNORMAL HIGH (ref 0.0–4.4)
Cholesterol, Total: 235 mg/dL — ABNORMAL HIGH (ref 100–199)
HDL: 51 mg/dL (ref >39)
LDL Chol Calc (NIH): 171 mg/dL — ABNORMAL HIGH (ref 0–99)
Triglycerides: 75 mg/dL (ref 0–149)
VLDL Cholesterol Cal: 13 mg/dL (ref 5–40)

## 2021-10-11 LAB — COMPREHENSIVE METABOLIC PANEL
ALT: 15 IU/L (ref 0–32)
AST: 18 IU/L (ref 0–40)
Albumin/Globulin Ratio: 1.9 (ref 1.2–2.2)
Albumin: 4.6 g/dL (ref 3.8–4.8)
Alkaline Phosphatase: 96 IU/L (ref 44–121)
BUN/Creatinine Ratio: 10 — ABNORMAL LOW (ref 12–28)
BUN: 7 mg/dL — ABNORMAL LOW (ref 8–27)
Bilirubin Total: 0.4 mg/dL (ref 0.0–1.2)
CO2: 22 mmol/L (ref 20–29)
Calcium: 9.3 mg/dL (ref 8.7–10.3)
Chloride: 104 mmol/L (ref 96–106)
Creatinine, Ser: 0.69 mg/dL (ref 0.57–1.00)
Globulin, Total: 2.4 g/dL (ref 1.5–4.5)
Glucose: 98 mg/dL (ref 70–99)
Potassium: 3.9 mmol/L (ref 3.5–5.2)
Sodium: 143 mmol/L (ref 134–144)
Total Protein: 7 g/dL (ref 6.0–8.5)
eGFR: 99 mL/min/{1.73_m2} (ref >59)

## 2021-10-11 LAB — CBC WITH DIFFERENTIAL/PLATELET
Basophils Absolute: 0 10*3/uL (ref 0.0–0.2)
Basos: 1 %
EOS (ABSOLUTE): 0.1 10*3/uL (ref 0.0–0.4)
Eos: 2 %
Hematocrit: 41.1 % (ref 34.0–46.6)
Hemoglobin: 13.7 g/dL (ref 11.1–15.9)
Immature Grans (Abs): 0 10*3/uL (ref 0.0–0.1)
Immature Granulocytes: 0 %
Lymphocytes Absolute: 1.5 10*3/uL (ref 0.7–3.1)
Lymphs: 30 %
MCH: 29.5 pg (ref 26.6–33.0)
MCHC: 33.3 g/dL (ref 31.5–35.7)
MCV: 89 fL (ref 79–97)
Monocytes Absolute: 0.4 10*3/uL (ref 0.1–0.9)
Monocytes: 7 %
Neutrophils Absolute: 3 10*3/uL (ref 1.4–7.0)
Neutrophils: 60 %
Platelets: 209 10*3/uL (ref 150–450)
RBC: 4.64 x10E6/uL (ref 3.77–5.28)
RDW: 13 % (ref 11.7–15.4)
WBC: 5.1 10*3/uL (ref 3.4–10.8)

## 2021-10-11 LAB — HEMOGLOBIN A1C
Est. average glucose Bld gHb Est-mCnc: 114 mg/dL
Hgb A1c MFr Bld: 5.6 % (ref 4.8–5.6)

## 2021-10-11 LAB — VITAMIN D 25 HYDROXY (VIT D DEFICIENCY, FRACTURES): Vit D, 25-Hydroxy: 30.9 ng/mL (ref 30.0–100.0)

## 2022-02-09 ENCOUNTER — Encounter: Payer: Managed Care, Other (non HMO) | Admitting: Physician Assistant

## 2022-02-09 ENCOUNTER — Encounter: Payer: Managed Care, Other (non HMO) | Admitting: Family Medicine

## 2022-02-24 NOTE — Progress Notes (Unsigned)
I,Anne Swanson S [name],acting as a Education administrator for Anne Paganini, MD.,have documented all relevant documentation on the behalf of Anne Paganini, MD,as directed by  Anne Paganini, MD while in the presence of Anne Paganini, MD.    Complete physical exam   Patient: Anne Swanson   DOB: Apr 11, 1961   61 y.o. Female  MRN: [id number] Visit Date: 02/27/2022  Today's healthcare provider: [name], MD   No chief complaint on file.  Subjective    Anne Swanson is a 61 y.o. female who presents today for a complete physical exam.  She reports consuming a {diet types:17450} diet. {Exercise:19826} She generally feels {well/fairly well/poorly:18703}. She reports sleeping {well/fairly well/poorly:18703}. She {does/does not:200015} have additional problems to discuss today.  HPI    Past Medical History:  Diagnosis Date   Anemia    Asthma    Complication of anesthesia    ITCHING X 30 HOURS AFTER SPINAL 1990   Fibrocystic breast disease    High cholesterol    Migraines    Osteopenia    Vertigo    Past Surgical History:  Procedure Laterality Date   CESAREAN SECTION     x3   CHOLECYSTECTOMY N/A 08/06/2017   Procedure: LAPAROSCOPIC CHOLECYSTECTOMY WITH INTRAOPERATIVE CHOLANGIOGRAM;  Surgeon: Robert Bellow, MD;  Location: [facility] ORS;  Service: General;  Laterality: N/A;   COLONOSCOPY     COLONOSCOPY WITH PROPOFOL N/A 03/02/2020   Procedure: COLONOSCOPY WITH PROPOFOL;  Surgeon: Lucilla Lame, MD;  Location: [facility] ENDOSCOPY;  Service: Endoscopy;  Laterality: N/A;   WISDOM TOOTH EXTRACTION     Social History   Socioeconomic History   Marital status: Married    Spouse name: Not on file   Number of children: 3   Years of education: Not on file   Highest education level: Not on file  Occupational History   Occupation: Accountant    Comment: Self employed  Tobacco Use   Smoking status: Never   Smokeless tobacco: Never  Vaping Use   Vaping Use: Never used   Substance and Sexual Activity   Alcohol use: Yes    Alcohol/week: 0.0 standard drinks of alcohol    Comment: once a month   Drug use: No   Sexual activity: Yes  Other Topics Concern   Not on file  Social History Narrative   Not on file   Social Determinants of Health   Financial Resource Strain: Not on file  Food Insecurity: Not on file  Transportation Needs: Not on file  Physical Activity: Not on file  Stress: Not on file  Social Connections: Not on file  Intimate Partner Violence: Not on file   Family Status  Relation Name Status   Mother  Deceased at age 38       Breast cancer and colon cancer   Father  Deceased at age 28       died from MI   Brother  Deceased at age 35       fdied from [place]  (Not Specified)   Other Mat Event organiser Deceased   Family History  Problem Relation Age of Onset   Colon cancer Mother    Breast cancer Mother    Coronary artery disease Father    Schizophrenia Father    Leukemia Brother    Stomach cancer Maternal Uncle    Breast cancer Other    Allergies  Allergen Reactions   Lodine [Etodolac] Other (See Comments)    Facial numbness  Patient Care Team: Virginia Crews, MD as PCP - General (Family Medicine)   Medications: Outpatient Medications Prior to Visit  Medication Sig   albuterol (VENTOLIN HFA) 108 (90 Base) MCG/ACT inhaler INHALE 2 PUFFS BY MOUTH EVERY 6 HOURS AS NEEDED FOR WHEEZE   loratadine (CLARITIN) 10 MG tablet Take 10 mg by mouth daily.   meclizine (ANTIVERT) 25 MG tablet Take 1 tablet (25 mg total) by mouth 3 (three) times daily as needed for dizziness.   No facility-administered medications prior to visit.    Review of Systems  All other systems reviewed and are negative.   Last CBC Lab Results  Component Value Date   WBC 5.1 10/10/2021   HGB 13.7 10/10/2021   HCT 41.1 10/10/2021   MCV 89 10/10/2021   MCH 29.5 10/10/2021   RDW 13.0 10/10/2021   PLT 209 16/01/9603   Last  metabolic panel Lab Results  Component Value Date   GLUCOSE 98 10/10/2021   NA 143 10/10/2021   K 3.9 10/10/2021   CL 104 10/10/2021   CO2 22 10/10/2021   BUN 7 (L) 10/10/2021   CREATININE 0.69 10/10/2021   EGFR 99 10/10/2021   CALCIUM 9.3 10/10/2021   PROT 7.0 10/10/2021   ALBUMIN 4.6 10/10/2021   LABGLOB 2.4 10/10/2021   AGRATIO 1.9 10/10/2021   BILITOT 0.4 10/10/2021   ALKPHOS 96 10/10/2021   AST 18 10/10/2021   ALT 15 10/10/2021   ANIONGAP 10 03/22/2020   Last lipids Lab Results  Component Value Date   CHOL 235 (H) 10/10/2021   HDL 51 10/10/2021   LDLCALC 171 (H) 10/10/2021   TRIG 75 10/10/2021   CHOLHDL 4.6 (H) 10/10/2021   Last hemoglobin A1c Lab Results  Component Value Date   HGBA1C 5.6 10/10/2021   Last thyroid functions Lab Results  Component Value Date   TSH 0.872 11/16/2014   T4TOTAL 7.6 11/16/2014   Last vitamin D Lab Results  Component Value Date   VD25OH 30.9 10/10/2021   Last vitamin B12 and Folate No results found for: "VITAMINB12", "FOLATE"    Objective    LMP 02/15/2014  BP Readings from Last 3 Encounters:  02/03/21 118/73  07/20/20 130/63  03/30/20 116/68   Wt Readings from Last 3 Encounters:  02/03/21 127 lb 11.2 oz (57.9 kg)  07/20/20 130 lb 12.8 oz (59.3 kg)  03/30/20 123 lb (55.8 kg)       Physical Exam  ***  Last depression screening scores    02/03/2021    2:17 PM 07/20/2020    3:50 PM 03/30/2020    9:21 AM  PHQ 2/9 Scores  PHQ - 2 Score 0 0 0  PHQ- 9 Score 3 1 0   Last fall risk screening    02/03/2021    2:16 PM  Fall Risk   Falls in the past year? 1  Number falls in past yr: 0  Injury with Fall? 0  Risk for fall due to : Other (Comment)  Risk for fall due to: Comment was walking dog and pulled leash too hard   Last Audit-C alcohol use screening    02/03/2021    2:16 PM  Alcohol Use Disorder Test (AUDIT)  1. How often do you have a drink containing alcohol? 2  2. How many drinks containing  alcohol do you have on a typical day when you are drinking? 0  3. How often do you have six or more drinks on one occasion? 0  AUDIT-C Score  2   A score of 3 or more in women, and 4 or more in men indicates increased risk for alcohol abuse, EXCEPT if all of the points are from question 1   No results found for any visits on 02/27/22.  Assessment & Plan    Routine Health Maintenance and Physical Exam  Exercise Activities and Dietary recommendations  Goals   None     Immunization History  Administered Date(s) Administered   Influenza,inj,Quad PF,6+ Mos 03/12/2017, 01/05/2020, 02/03/2021   PFIZER Comirnaty(Gray Top)Covid-19 Tri-Sucrose Vaccine 07/22/2020   PFIZER(Purple Top)SARS-COV-2 Vaccination 06/27/2019, 07/22/2019, 02/09/2020   Pfizer Covid-19 Vaccine Bivalent Booster 59yr & up 02/26/2021   Pneumococcal Polysaccharide-23 01/05/2020   Tdap 03/19/2007, 01/05/2020    Health Maintenance  Topic Date Due   Zoster Vaccines- Shingrix (1 of 2) Never done   COVID-19 Vaccine (6 - Pfizer risk series) 04/23/2021   INFLUENZA VACCINE  11/15/2021   PAP SMEAR-Modifier  03/12/2022   MAMMOGRAM  06/09/2023   COLONOSCOPY (Pts 45-448yrInsurance coverage will need to be confirmed)  03/02/2025   Hepatitis C Screening  Completed   HIV Screening  Completed   HPV VACCINES  Aged Out   TETANUS/TDAP  Discontinued    Discussed health benefits of physical activity, and encouraged her to engage in regular exercise appropriate for her age and condition.  ***  No follow-ups on file.     {provider attestation***:1}   AnLavon PaganiniMD  [phone]) [phone])  [place]

## 2022-02-27 ENCOUNTER — Other Ambulatory Visit (HOSPITAL_COMMUNITY)
Admission: RE | Admit: 2022-02-27 | Discharge: 2022-02-27 | Disposition: A | Discharge status: Home / Self Care | Payer: Managed Care, Other (non HMO) | Source: Ambulatory Visit | Attending: Family Medicine | Admitting: Family Medicine

## 2022-02-27 ENCOUNTER — Encounter: Payer: Self-pay | Admitting: Family Medicine

## 2022-02-27 ENCOUNTER — Ambulatory Visit (INDEPENDENT_AMBULATORY_CARE_PROVIDER_SITE_OTHER): Payer: Managed Care, Other (non HMO) | Admitting: Family Medicine

## 2022-02-27 VITALS — BP 130/74 | HR 72 | Temp 98.0°F | Resp 16 | Ht 62.0 in | Wt 128.0 lb

## 2022-02-27 DIAGNOSIS — Z0000 Encounter for general adult medical examination without abnormal findings: Secondary | ICD-10-CM | POA: Insufficient documentation

## 2022-02-27 DIAGNOSIS — Z862 Personal history of diseases of the blood and blood-forming organs and certain disorders involving the immune mechanism: Secondary | ICD-10-CM

## 2022-02-27 DIAGNOSIS — R739 Hyperglycemia, unspecified: Secondary | ICD-10-CM

## 2022-02-27 DIAGNOSIS — Z23 Encounter for immunization: Secondary | ICD-10-CM

## 2022-02-27 DIAGNOSIS — R079 Chest pain, unspecified: Secondary | ICD-10-CM

## 2022-02-27 DIAGNOSIS — E782 Mixed hyperlipidemia: Secondary | ICD-10-CM

## 2022-02-27 DIAGNOSIS — E559 Vitamin D deficiency, unspecified: Secondary | ICD-10-CM

## 2022-02-27 DIAGNOSIS — Z124 Encounter for screening for malignant neoplasm of cervix: Secondary | ICD-10-CM | POA: Diagnosis present

## 2022-02-27 NOTE — Assessment & Plan Note (Signed)
New problem x1 Non-exertional and short-lived No worrisome assoc symptoms Consider esophageal spasm If recurs will do more workup

## 2022-02-27 NOTE — Assessment & Plan Note (Signed)
Reviewed last lipid panel Not currently on a statin Recheck FLP and CMP Discussed diet and exercise  

## 2022-03-06 LAB — CYTOLOGY - PAP: Adequacy: ABNORMAL

## 2022-03-09 LAB — CBC WITH DIFFERENTIAL/PLATELET
Basophils Absolute: 0 10*3/uL (ref 0.0–0.2)
Basos: 0 %
EOS (ABSOLUTE): 0.1 10*3/uL (ref 0.0–0.4)
Eos: 1 %
Hematocrit: 41.9 % (ref 34.0–46.6)
Hemoglobin: 14.1 g/dL (ref 11.1–15.9)
Immature Grans (Abs): 0 10*3/uL (ref 0.0–0.1)
Immature Granulocytes: 0 %
Lymphocytes Absolute: 1.4 10*3/uL (ref 0.7–3.1)
Lymphs: 27 %
MCH: 29.9 pg (ref 26.6–33.0)
MCHC: 33.7 g/dL (ref 31.5–35.7)
MCV: 89 fL (ref 79–97)
Monocytes Absolute: 0.4 10*3/uL (ref 0.1–0.9)
Monocytes: 8 %
Neutrophils Absolute: 3.2 10*3/uL (ref 1.4–7.0)
Neutrophils: 64 %
Platelets: 176 10*3/uL (ref 150–450)
RBC: 4.72 x10E6/uL (ref 3.77–5.28)
RDW: 12.3 % (ref 11.7–15.4)
WBC: 5 10*3/uL (ref 3.4–10.8)

## 2022-03-09 LAB — COMPREHENSIVE METABOLIC PANEL
ALT: 16 IU/L (ref 0–32)
AST: 21 IU/L (ref 0–40)
Albumin/Globulin Ratio: 2.1 (ref 1.2–2.2)
Albumin: 4.6 g/dL (ref 3.9–4.9)
Alkaline Phosphatase: 119 IU/L (ref 44–121)
BUN/Creatinine Ratio: 10 — ABNORMAL LOW (ref 12–28)
BUN: 8 mg/dL (ref 8–27)
Bilirubin Total: 0.6 mg/dL (ref 0.0–1.2)
CO2: 25 mmol/L (ref 20–29)
Calcium: 9.3 mg/dL (ref 8.7–10.3)
Chloride: 101 mmol/L (ref 96–106)
Creatinine, Ser: 0.78 mg/dL (ref 0.57–1.00)
Globulin, Total: 2.2 g/dL (ref 1.5–4.5)
Glucose: 86 mg/dL (ref 70–99)
Potassium: 4.1 mmol/L (ref 3.5–5.2)
Sodium: 141 mmol/L (ref 134–144)
Total Protein: 6.8 g/dL (ref 6.0–8.5)
eGFR: 86 mL/min/{1.73_m2} (ref >59)

## 2022-03-09 LAB — LIPID PANEL WITH LDL/HDL RATIO
Cholesterol, Total: 225 mg/dL — ABNORMAL HIGH (ref 100–199)
HDL: 55 mg/dL (ref >39)
LDL Chol Calc (NIH): 157 mg/dL — ABNORMAL HIGH (ref 0–99)
LDL/HDL Ratio: 2.9 ratio (ref 0.0–3.2)
Triglycerides: 77 mg/dL (ref 0–149)
VLDL Cholesterol Cal: 13 mg/dL (ref 5–40)

## 2022-03-09 LAB — HEMOGLOBIN A1C
Est. average glucose Bld gHb Est-mCnc: 114 mg/dL
Hgb A1c MFr Bld: 5.6 % (ref 4.8–5.6)

## 2022-03-09 LAB — VITAMIN D 25 HYDROXY (VIT D DEFICIENCY, FRACTURES): Vit D, 25-Hydroxy: 26.2 ng/mL — ABNORMAL LOW (ref 30.0–100.0)

## 2022-03-13 ENCOUNTER — Telehealth: Payer: Self-pay

## 2022-03-13 DIAGNOSIS — R87629 Unspecified abnormal cytological findings in specimens from vagina: Secondary | ICD-10-CM

## 2022-03-13 NOTE — Telephone Encounter (Signed)
-----   Message from Erasmo Downer, MD sent at 03/13/2022 10:05 AM EST ----- Ok to place referral then ----- Message ----- From: Myles Lipps, CMA Sent: 03/08/2022   3:10 PM EST To: Erasmo Downer, MD  Patient aware. Patient would like to go ahead with GYN referral. Please advise.

## 2022-03-31 ENCOUNTER — Encounter: Payer: Managed Care, Other (non HMO) | Admitting: Licensed Practical Nurse

## 2022-04-24 ENCOUNTER — Encounter: Payer: Self-pay | Admitting: Obstetrics

## 2022-04-24 ENCOUNTER — Ambulatory Visit: Payer: Managed Care, Other (non HMO) | Admitting: Obstetrics

## 2022-04-24 VITALS — BP 111/61 | HR 76 | Ht 62.0 in | Wt 129.4 lb

## 2022-04-24 DIAGNOSIS — Z124 Encounter for screening for malignant neoplasm of cervix: Secondary | ICD-10-CM

## 2022-04-24 DIAGNOSIS — Z01419 Encounter for gynecological examination (general) (routine) without abnormal findings: Secondary | ICD-10-CM

## 2022-04-24 DIAGNOSIS — Z7689 Persons encountering health services in other specified circumstances: Secondary | ICD-10-CM

## 2022-04-24 DIAGNOSIS — R350 Frequency of micturition: Secondary | ICD-10-CM

## 2022-04-24 DIAGNOSIS — R32 Unspecified urinary incontinence: Secondary | ICD-10-CM

## 2022-04-24 DIAGNOSIS — Z01411 Encounter for gynecological examination (general) (routine) with abnormal findings: Secondary | ICD-10-CM | POA: Diagnosis not present

## 2022-04-24 DIAGNOSIS — N898 Other specified noninflammatory disorders of vagina: Secondary | ICD-10-CM | POA: Diagnosis not present

## 2022-04-24 LAB — POCT URINALYSIS DIPSTICK
Bilirubin, UA: NEGATIVE
Blood, UA: NEGATIVE
Glucose, UA: NEGATIVE
Ketones, UA: NEGATIVE
Leukocytes, UA: NEGATIVE
Nitrite, UA: NEGATIVE
Protein, UA: NEGATIVE
Spec Grav, UA: 1.01 (ref 1.010–1.025)
Urobilinogen, UA: 0.2 E.U./dL
pH, UA: 6 (ref 5.0–8.0)

## 2022-04-24 NOTE — Progress Notes (Signed)
Gynecology Annual Exam  PCP: Erasmo Downer, MD  Chief Complaint:  Chief Complaint  Patient presents with  . Pap Smear    History of Present Illness:Patient is a 62 y.o. G3P3 presents for annual exam. The patient has no complaints today.   LMP: Patient's last menstrual period was 02/15/2014. Menarche:{numbers 6-06:30160} Average Interval: {Desc; regular/irreg:14544}, {numbers 22-35:14824} days Duration of flow: {numbers; 0-10:33138} days Heavy Menses: {yes/no:63} Clots: {yes/no:63} Intermenstrual Bleeding: {yes/no:63} Postcoital Bleeding: {yes/no:63} Dysmenorrhea: {yes/no:63}  The patient {sys sexually active:13135} sexually active. She {has/denies:315300} dyspareunia.  The patient {DOES_DOES FUX:32355} perform self breast exams.  There {is/is no:19420} notable family history of breast or ovarian cancer in her family.  The patient wears seatbelts: {yes/no:63}.   The patient has regular exercise: {yes/no/not asked:9010}.    The patient {Blank single:19197::"reports","denies"} current symptoms of depression.     Review of Systems: ROS  Past Medical History:  Patient Active Problem List   Diagnosis Date Noted  . Chest pain 02/27/2022  . Gastroesophageal reflux disease without esophagitis 02/03/2021  . Atrophic vaginitis 07/20/2020  . Benign paroxysmal positional vertigo of right ear 03/30/2020  . Polyp of descending colon   . Calculus of gallbladder without cholecystitis without obstruction 06/29/2017  . Hyperlipidemia 03/01/2015  . Asthma 11/16/2014  . Diffuse cystic mastopathy of breast 11/16/2014  . History of anemia 11/16/2014  . Osteopenia 11/16/2014  . Vitamin D deficiency 11/16/2014    Baseline Vit D 25-OH=26.4 on 01/14/2008     Past Surgical History:  Past Surgical History:  Procedure Laterality Date  . CESAREAN SECTION     x3  . CHOLECYSTECTOMY N/A 08/06/2017   Procedure: LAPAROSCOPIC CHOLECYSTECTOMY WITH INTRAOPERATIVE CHOLANGIOGRAM;  Surgeon:  Earline Mayotte, MD;  Location: [facility] ORS;  Service: General;  Laterality: N/A;  . COLONOSCOPY    . COLONOSCOPY WITH PROPOFOL N/A 03/02/2020   Procedure: COLONOSCOPY WITH PROPOFOL;  Surgeon: Midge Minium, MD;  Location: [facility] ENDOSCOPY;  Service: Endoscopy;  Laterality: N/A;  . WISDOM TOOTH EXTRACTION      Gynecologic History:  Patient's last menstrual period was 02/15/2014. Last Pap: Results were: *** {Findings; lab pap smear results:16707::"NIL and HR HPV+","NIL and HR HPV negative"}  Last mammogram: *** Results were: {Blank single:19197::"***","BI-RADS IV","BI-RAD III","BI-RAD II","BI-RAD I"}  Obstetric History: G3P3  Family History:  Family History  Problem Relation Age of Onset  . Colon cancer Mother   . Breast cancer Mother   . Coronary artery disease Father   . Schizophrenia Father   . Leukemia Brother   . Stomach cancer Maternal Uncle   . Breast cancer Other     Social History:  Social History   Socioeconomic History  . Marital status: Married    Spouse name: Not on file  . Number of children: 3  . Years of education: Not on file  . Highest education level: Not on file  Occupational History  . Occupation: Airline pilot    Comment: Self employed  Tobacco Use  . Smoking status: Never  . Smokeless tobacco: Never  Vaping Use  . Vaping Use: Never used  Substance and Sexual Activity  . Alcohol use: Yes    Alcohol/week: 0.0 standard drinks of alcohol    Comment: once a month  . Drug use: No  . Sexual activity: Yes    Birth control/protection: Post-menopausal  Other Topics Concern  . Not on file  Social History Narrative  . Not on file   Social Determinants of Health   Financial Resource Strain:  Not on file  Food Insecurity: Not on file  Transportation Needs: Not on file  Physical Activity: Not on file  Stress: Not on file  Social Connections: Not on file  Intimate Partner Violence: Not on file    Allergies:  Allergies  Allergen Reactions  . Lodine  [Etodolac] Other (See Comments)    Facial numbness    Medications: Prior to Admission medications   Medication Sig Start Date End Date Taking? Authorizing Provider  albuterol (VENTOLIN HFA) 108 (90 Base) MCG/ACT inhaler INHALE 2 PUFFS BY MOUTH EVERY 6 HOURS AS NEEDED FOR WHEEZE 02/12/20  Yes Bacigalupo, Marzella Schlein, MD  loratadine (CLARITIN) 10 MG tablet Take 10 mg by mouth daily.   Yes [provider]  meclizine (ANTIVERT) 25 MG tablet Take 1 tablet (25 mg total) by mouth 3 (three) times daily as needed for dizziness. 03/30/20  Yes Bacigalupo, Marzella Schlein, MD    Physical Exam Vitals: Blood pressure 111/61, pulse 76, height 5\' 2"  (1.575 m), weight 129 lb 6.4 oz (58.7 kg), last menstrual period 02/15/2014.  General: NAD HEENT: normocephalic, anicteric Thyroid: no enlargement, no palpable nodules Pulmonary: No increased work of breathing, CTAB Cardiovascular: RRR, distal pulses 2+ Breast: Breast symmetrical, no tenderness, no palpable nodules or masses, no skin or nipple retraction present, no nipple discharge.  No axillary or supraclavicular lymphadenopathy. Abdomen: NABS, soft, non-tender, non-distended.  Umbilicus without lesions.  No hepatomegaly, splenomegaly or masses palpable. No evidence of hernia  Genitourinary:  External: Normal external female genitalia.  Normal urethral meatus, normal Bartholin's and Skene's glands.    Vagina: Normal vaginal mucosa, no evidence of prolapse.    Cervix: Grossly normal in appearance, no bleeding  Uterus: Non-enlarged, mobile, normal contour.  No CMT  Adnexa: ovaries non-enlarged, no adnexal masses  Rectal: deferred  Lymphatic: no evidence of inguinal lymphadenopathy Extremities: no edema, erythema, or tenderness Neurologic: Grossly intact Psychiatric: mood appropriate, affect full  Female chaperone present for pelvic and breast  portions of the physical exam     Assessment: 62 y.o. G3P3 routine annual exam  Plan: Problem List Items  Addressed This Visit   None Visit Diagnoses     Establishing care with new doctor, encounter for    -  Primary   Cervical cancer screening       Relevant Orders   IGP, cobasHPV16/18   Vaginal discharge       Relevant Orders   NuSwab BV and Candida, NAA   Frequent urination       Relevant Orders   POCT Urinalysis Dipstick       1) Mammogram - recommend yearly screening mammogram.  Mammogram {Blank single:19197::"Is up to date","Was ordered today"}  2) STI screening  {Blank single:19197::"was","was not"}offered and {Blank single:19197::"accepted","declined","therefore not obtained"}  3) ASCCP guidelines and rational discussed.  Patient opts for {Blank single:19197::"***","every 5 years","every 3 years","yearly","discontinue age >65","discontinue secondary to prior hysterectomy"} screening interval  4) Osteoporosis  - per USPTF routine screening DEXA at age 29 - FRAX 10 year major fracture risk ***,  10 year hip fracture risk ***  Consider FDA-approved medical therapies in postmenopausal women and men aged 16 years and older, based on the following: a) A hip or vertebral (clinical or morphometric) fracture b) T-score = -2.5 at the femoral neck or spine after appropriate evaluation to exclude secondary causes C) Low bone mass (T-score between -1.0 and -2.5 at the femoral neck or spine) and a 10-year probability of a hip fracture = 3% or a 10-year probability  of a major osteoporosis-related fracture = 20% based on the [place]-adapted WHO algorithm   5) Routine healthcare maintenance including cholesterol, diabetes screening discussed {Blank single:19197::"managed by PCP","Ordered today","To return fasting at a later date","Declines"}  6) Colonoscopy ***.  Screening recommended starting at age 43 for average risk individuals, age 81 for individuals deemed at increased risk (including African Americans) and recommended to continue until age 55.  For patient age 84-85 individualized approach  is recommended.  Gold standard screening is via colonoscopy, Cologuard screening is an acceptable alternative for patient unwilling or unable to undergo colonoscopy.  "Colorectal cancer screening for average-risk adults: 2018 guideline update from the American Cancer Society"CA: A Cancer Journal for Clinicians: Sep 13, 2016   7) No follow-ups on file.    Imagene Riches, CNM  04/24/2022 2:38 PM   [facility] 04/24/2022, 2:38 PM

## 2022-04-26 LAB — NUSWAB BV AND CANDIDA, NAA
Candida albicans, NAA: NEGATIVE
Candida glabrata, NAA: NEGATIVE

## 2022-04-28 LAB — IGP, COBASHPV16/18
HPV 16: NEGATIVE
HPV 18: NEGATIVE
HPV other hr types: NEGATIVE

## 2022-04-30 ENCOUNTER — Encounter: Payer: Self-pay | Admitting: Obstetrics

## 2022-07-07 ENCOUNTER — Encounter: Payer: Self-pay | Admitting: Family Medicine

## 2022-07-07 DIAGNOSIS — J45909 Unspecified asthma, uncomplicated: Secondary | ICD-10-CM

## 2022-07-17 MED ORDER — ALBUTEROL SULFATE HFA 108 (90 BASE) MCG/ACT IN AERS: INHALATION_SPRAY | RESPIRATORY_TRACT | 6 refills | Status: DC

## 2022-07-18 ENCOUNTER — Ambulatory Visit
Admission: RE | Admit: 2022-07-18 | Discharge: 2022-07-18 | Disposition: A | Discharge status: Home / Self Care | Payer: Managed Care, Other (non HMO) | Source: Ambulatory Visit | Attending: Family Medicine

## 2022-07-18 ENCOUNTER — Ambulatory Visit: Payer: Managed Care, Other (non HMO) | Admitting: Family Medicine

## 2022-07-18 ENCOUNTER — Encounter: Payer: Self-pay | Admitting: Family Medicine

## 2022-07-18 ENCOUNTER — Ambulatory Visit
Admission: RE | Admit: 2022-07-18 | Discharge: 2022-07-18 | Disposition: A | Discharge status: Home / Self Care | Payer: Managed Care, Other (non HMO) | Attending: Family Medicine | Admitting: Family Medicine

## 2022-07-18 VITALS — BP 96/63 | HR 72 | Temp 98.0°F | Resp 12 | Wt 133.0 lb

## 2022-07-18 DIAGNOSIS — G8929 Other chronic pain: Secondary | ICD-10-CM

## 2022-07-18 DIAGNOSIS — M79645 Pain in left finger(s): Secondary | ICD-10-CM | POA: Insufficient documentation

## 2022-07-18 DIAGNOSIS — M79644 Pain in right finger(s): Secondary | ICD-10-CM | POA: Diagnosis not present

## 2022-07-18 DIAGNOSIS — M67449 Ganglion, unspecified hand: Secondary | ICD-10-CM | POA: Insufficient documentation

## 2022-07-18 NOTE — Progress Notes (Signed)
I,Sulibeya S Dimas,acting as a Education administrator for [name], MD.,have documented all relevant documentation on the behalf of [name], MD,as directed by  [name], MD while in the presence of [name], MD.     Established patient visit   Patient: Anne Swanson   DOB: March 19, 1961   62 y.o. Female  MRN: [id number] Visit Date: 07/18/2022  Today's healthcare provider: [name], MD   Chief Complaint  Patient presents with   Cyst   Subjective    HPI  Patient C/O of cyst on right thumb x 2-3 months. She reports cyst is not painful. She reports her whole hand is painful. She denies cyst growing in size.   R hand dominant  Trigger finger 3rd finger R hand in November now cannot close enough to trigger.  Medications: Outpatient Medications Prior to Visit  Medication Sig   albuterol (VENTOLIN HFA) 108 (90 Base) MCG/ACT inhaler INHALE 2 PUFFS BY MOUTH EVERY 6 HOURS AS NEEDED FOR WHEEZE   loratadine (CLARITIN) 10 MG tablet Take 10 mg by mouth daily.   meclizine (ANTIVERT) 25 MG tablet Take 1 tablet (25 mg total) by mouth 3 (three) times daily as needed for dizziness.   No facility-administered medications prior to visit.    Review of Systems  Constitutional:  Negative for chills and fatigue.  Respiratory:  Positive for chest tightness and shortness of breath. Negative for cough.   Musculoskeletal:  Positive for arthralgias and myalgias.       Objective    BP 96/63 (BP Location: Left Arm, Patient Position: Sitting, Cuff Size: Large)   Pulse 72   Temp 98 F (36.7 C) (Temporal)   Resp 12   Wt 133 lb (60.3 kg)   LMP 02/15/2014   SpO2 98%   BMI 24.33 kg/m    Physical Exam Vitals reviewed.  Constitutional:      General: She is not in acute distress.    Appearance: She is well-developed.  HENT:     Head: Normocephalic and atraumatic.  Eyes:     General: No scleral icterus.    Conjunctiva/sclera: Conjunctivae normal.   Cardiovascular:     Rate and Rhythm: Normal rate and regular rhythm.  Pulmonary:     Effort: Pulmonary effort is normal. No respiratory distress.  Musculoskeletal:     Comments: L thumb: Swelling at [facility] joint, ROM intact R hand: small cyst on lateral R thumb DIP, Pain and limited ROM in 3rd finger  Skin:    General: Skin is warm and dry.     Findings: No rash.  Neurological:     Mental Status: She is alert and oriented to person, place, and time.  Psychiatric:        Behavior: Behavior normal.       No results found for any visits on 07/18/22.  Assessment & Plan     1. Digital mucinous cyst of finger - new problem x several months - discussed benign nature - will discuss with Hand surgeon as she is being referred as below - DG Hand Complete Right; Future - Ambulatory referral to Hand Surgery  2. 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 Finger pain, right - new problem - loss of ROM and significant pain in her dominant hand -  had trigger finger, but now unable to bend finger that far - get XRay and refer to hand surgery for further eval/management - DG Hand Complete Right; Future - Ambulatory referral to Hand Surgery    No orders of the defined types were placed in this encounter.    Return if symptoms worsen or fail to improve.      I, [name], MD, have reviewed all documentation for this visit. The documentation on 07/18/22 for the exam, diagnosis, procedures, and orders are all accurate and complete.   Monea Pesantez, Dionne Bucy, MD, MPH [facility]

## 2022-08-14 ENCOUNTER — Ambulatory Visit: Payer: Managed Care, Other (non HMO) | Admitting: Urology

## 2022-08-14 VITALS — BP 119/73 | HR 74 | Ht 62.0 in | Wt 133.0 lb

## 2022-08-14 DIAGNOSIS — N3281 Overactive bladder: Secondary | ICD-10-CM | POA: Diagnosis not present

## 2022-08-14 DIAGNOSIS — R350 Frequency of micturition: Secondary | ICD-10-CM | POA: Diagnosis not present

## 2022-08-14 DIAGNOSIS — R32 Unspecified urinary incontinence: Secondary | ICD-10-CM

## 2022-08-14 MED ORDER — ESTRADIOL 0.1 MG/GM VA CREA: TOPICAL_CREAM | VAGINAL | 11 refills | Status: DC

## 2022-08-14 NOTE — Progress Notes (Signed)
08/14/2022 2:16 PM   Anne Swanson May 07, 1960 [id number]  Referring provider: Mirna Mires, CNM [address] [place],  [place] [address]  Chief Complaint  Patient presents with   New Patient (Initial Visit)   Urinary Incontinence    HPI: Was consulted to assess the patient's frequency.  She voids at least every 1 hour sometimes triggered when she goes from sitting to standing position.  It is associated with urgency.  She is an Airline pilot.  No nocturia.  Flow is reasonable and she feels empty  Once a month or once every other months she leaks a small amount with a cough but otherwise is continent  She does have dyspareunia and vaginal dryness.  She never took estrogen cream because of the labile and had concerns  She has not had a hysterectomy.  No neurologic issues.  Loose bowel movements.  No history of bladder surgery kidney stones or bladder infections.  No treatment for frequency   PMH: Past Medical History:  Diagnosis Date   Anemia    Asthma    Complication of anesthesia    ITCHING X 30 HOURS AFTER SPINAL 1990   Fibrocystic breast disease    High cholesterol    Migraines    Osteopenia    Vertigo     Surgical History: Past Surgical History:  Procedure Laterality Date   CESAREAN SECTION     x3   CHOLECYSTECTOMY N/A 08/06/2017   Procedure: LAPAROSCOPIC CHOLECYSTECTOMY WITH INTRAOPERATIVE CHOLANGIOGRAM;  Surgeon: Earline Mayotte, MD;  Location: [facility];  Service: General;  Laterality: N/A;   COLONOSCOPY     COLONOSCOPY WITH PROPOFOL N/A 03/02/2020   Procedure: COLONOSCOPY WITH PROPOFOL;  Surgeon: Midge Minium, MD;  Location: [facility];  Service: Endoscopy;  Laterality: N/A;   WISDOM TOOTH EXTRACTION      Home Medications:  Allergies as of 08/14/2022       Reactions   Lodine [etodolac] Other (See Comments)   Facial numbness        Medication List        Accurate as of August 14, 2022  2:16 PM. If you have any questions,  ask your nurse or doctor.          albuterol 108 (90 Base) MCG/ACT inhaler Commonly known as: VENTOLIN HFA INHALE 2 PUFFS BY MOUTH EVERY 6 HOURS AS NEEDED FOR WHEEZE   loratadine 10 MG tablet Commonly known as: CLARITIN Take 10 mg by mouth daily.   meclizine 25 MG tablet Commonly known as: ANTIVERT Take 1 tablet (25 mg total) by mouth 3 (three) times daily as needed for dizziness.        Allergies:  Allergies  Allergen Reactions   Lodine [Etodolac] Other (See Comments)    Facial numbness    Family History: Family History  Problem Relation Age of Onset   Colon cancer Mother    Breast cancer Mother    Coronary artery disease Father    Schizophrenia Father    Leukemia Brother    Stomach cancer Maternal Uncle    Breast cancer Other     Social History:  reports that she has never smoked. She has never used smokeless tobacco. She reports current alcohol use. She reports that she does not use drugs.  ROS:  Physical Exam: LMP 02/15/2014   Constitutional:  Alert and oriented, No acute distress. HEENT: [employer] AT, moist mucus membranes.  Trachea midline, no masses. Cardiovascular: No clubbing, cyanosis, or edema. Respiratory: Normal respiratory effort, no increased work of breathing. GI: Abdomen is soft, nontender, nondistended, no abdominal masses GU: Well supported bladder neck.  She did have vaginal atrophy.  No stress incontinence or prolapse.  She was a bit tender. Skin: No rashes, bruises or suspicious lesions. Lymph: No cervical or inguinal adenopathy. Neurologic: Grossly intact, no focal deficits, moving all 4 extremities. Psychiatric: Normal mood and affect.  Laboratory Data: Lab Results  Component Value Date   WBC 5.0 03/08/2022   HGB 14.1 03/08/2022   HCT 41.9 03/08/2022   MCV 89 03/08/2022   PLT 176 03/08/2022    Lab Results  Component Value Date   CREATININE 0.78 03/08/2022    No  results found for: "PSA"  No results found for: "TESTOSTERONE"  Lab Results  Component Value Date   HGBA1C 5.6 03/08/2022    Urinalysis    Component Value Date/Time   COLORURINE STRAW (A) 01/25/2015 1829   APPEARANCEUR CLEAR (A) 01/25/2015 1829   LABSPEC 1.005 01/25/2015 1829   PHURINE 6.0 01/25/2015 1829   GLUCOSEU NEGATIVE 01/25/2015 1829   HGBUR NEGATIVE 01/25/2015 1829   BILIRUBINUR neg 04/24/2022 1454   KETONESUR NEGATIVE 01/25/2015 1829   PROTEINUR Negative 04/24/2022 1454   PROTEINUR NEGATIVE 01/25/2015 1829   UROBILINOGEN 0.2 04/24/2022 1454   NITRITE neg 04/24/2022 1454   NITRITE NEGATIVE 01/25/2015 1829   LEUKOCYTESUR Negative 04/24/2022 1454    Pertinent Imaging: Urine reviewed.  Urine sent for culture.  Assessment & Plan: Has an overactive bladder with frequency.  Role of medical and behavioral therapy discussed.  Role of local estrogen cream for dyspareunia and dryness discussed.  Follow-up with her gynecologist for dyspareunia.  I do not think she has interstitial cystitis.  Urgent prescription sent.  Pros cons risk discussed.  Follow-up with gynecology for this.  Physical therapy consultation given.  Patient will hold off on medication for now but make another appointment if she wants to try an overactive bladder medication in future.  Call if culture positive  1. Urinary incontinence, unspecified type  - Urinalysis, Complete   No follow-ups on file.  Martina Sinner, MD  [facility] [address], [address] [place], [place] [address] [phone]

## 2022-08-15 LAB — URINALYSIS, COMPLETE
Bilirubin, UA: NEGATIVE
Glucose, UA: NEGATIVE
Ketones, UA: NEGATIVE
Nitrite, UA: NEGATIVE
Protein,UA: NEGATIVE
RBC, UA: NEGATIVE
Specific Gravity, UA: 1.01 (ref 1.005–1.030)
Urobilinogen, Ur: 0.2 mg/dL (ref 0.2–1.0)
pH, UA: 6.5 (ref 5.0–7.5)

## 2022-08-15 LAB — MICROSCOPIC EXAMINATION

## 2022-09-06 ENCOUNTER — Ambulatory Visit (HOSPITAL_COMMUNITY): Payer: Managed Care, Other (non HMO) | Admitting: Physical Therapy

## 2022-11-03 ENCOUNTER — Ambulatory Visit (HOSPITAL_COMMUNITY): Payer: Managed Care, Other (non HMO) | Admitting: Physical Therapy

## 2022-11-22 ENCOUNTER — Other Ambulatory Visit: Payer: Self-pay | Admitting: Family Medicine

## 2022-11-22 ENCOUNTER — Ambulatory Visit: Payer: Managed Care, Other (non HMO) | Admitting: Family Medicine

## 2022-11-22 VITALS — BP 116/63 | HR 78 | Ht 62.0 in | Wt 125.3 lb

## 2022-11-22 DIAGNOSIS — N61 Mastitis without abscess: Secondary | ICD-10-CM

## 2022-11-22 DIAGNOSIS — N63 Unspecified lump in unspecified breast: Secondary | ICD-10-CM

## 2022-11-22 MED ORDER — SULFAMETHOXAZOLE-TRIMETHOPRIM 800-160 MG PO TABS: 1.0000 | ORAL_TABLET | Freq: Two times a day (BID) | ORAL | 0 refills | Status: DC

## 2022-11-22 NOTE — Progress Notes (Signed)
Established patient visit   Patient: Anne Swanson   DOB: 15-Sep-1960   62 y.o. Female  MRN: [id number] Visit Date: 11/22/2022  Today's healthcare provider: Jacky Kindle, FNP  Introduced to nurse practitioner role and practice setting.  All questions answered.  Discussed provider/patient relationship and expectations.  Subjective    HPI HPI     Breast Pain    Additional comments: Patient reports located in left breast and associated with edema that began last night around 10:00 pm. Reports symptoms are worse and pinker.      Last edited by Acey Lav, CMA on 11/22/2022  2:40 PM.       Medications: Outpatient Medications Prior to Visit  Medication Sig   albuterol (VENTOLIN HFA) 108 (90 Base) MCG/ACT inhaler INHALE 2 PUFFS BY MOUTH EVERY 6 HOURS AS NEEDED FOR WHEEZE   estradiol (ESTRACE) 0.1 MG/GM vaginal cream Estrogen Cream Instruction Discard applicator Apply pea sized amount to tip of finger to urethra before bed. Wash hands well after application. Use Monday, Wednesday and Friday   loratadine (CLARITIN) 10 MG tablet Take 10 mg by mouth daily.   meclizine (ANTIVERT) 25 MG tablet Take 1 tablet (25 mg total) by mouth 3 (three) times daily as needed for dizziness.   No facility-administered medications prior to visit.    Review of Systems    Objective    BP 116/63 (BP Location: Right Arm, Patient Position: Sitting, Cuff Size: Normal)   Pulse 78   Ht 5\' 2"  (1.575 m)   Wt 125 lb 4.8 oz (56.8 kg)   LMP 02/15/2014   SpO2 99%   BMI 22.92 kg/m   Physical Exam Vitals and nursing note reviewed.  Constitutional:      General: She is not in acute distress.    Appearance: Normal appearance. She is normal weight. She is not ill-appearing, toxic-appearing or diaphoretic.  HENT:     Head: Normocephalic and atraumatic.  Cardiovascular:     Rate and Rhythm: Normal rate and regular rhythm.     Pulses: Normal pulses.     Heart sounds: Normal heart sounds. No murmur  heard.    No friction rub. No gallop.  Pulmonary:     Effort: Pulmonary effort is normal. No respiratory distress.     Breath sounds: Normal breath sounds. No stridor. No wheezing, rhonchi or rales.  Chest:     Chest wall: No tenderness.       Comments: Breasts: symmetric fibrous changes in both upper outer quadrants, right breast normal without mass, skin or nipple changes or axillary nodes, L breast with warmth, erythema, edema and concern for infection. Recommend treatment for infection with imaging. Pt denies known injury or other symptoms; was at a church event yesterday evening.   Musculoskeletal:        General: No swelling, tenderness, deformity or signs of injury. Normal range of motion.     Right lower leg: No edema.     Left lower leg: No edema.  Skin:    General: Skin is warm and dry.     Capillary Refill: Capillary refill takes less than 2 seconds.     Coloration: Skin is not jaundiced or pale.     Findings: Erythema present. No bruising, lesion or rash.  Neurological:     General: No focal deficit present.     Mental Status: She is alert and oriented to person, place, and time. Mental status is at baseline.  Psychiatric:  Mood and Affect: Mood normal.        Behavior: Behavior normal.        Thought Content: Thought content normal.        Judgment: Judgment normal.     No results found for any visits on 11/22/22.  Assessment & Plan     Problem List Items Addressed This Visit   None Visit Diagnoses     Mastitis in female    -  Primary   Relevant Medications   sulfamethoxazole-trimethoprim (BACTRIM DS) 800-160 MG tablet   Other Relevant Orders   MS 3D DIAG MAMMO BILAT BR (aka MM)   [place] LIMITED ULTRASOUND INCLUDING AXILLA LEFT BREAST      Breasts: symmetric fibrous changes in both upper outer quadrants, right breast normal without mass, skin or nipple changes or axillary nodes, L breast with warmth, erythema, edema and concern for infection. Recommend  treatment for infection with imaging. Pt denies known injury or other symptoms; was at a church event yesterday evening.       Leilani Merl, FNP, have reviewed all documentation for this visit. The documentation on 11/22/22 for the exam, diagnosis, procedures, and orders are all accurate and complete.  Jacky Kindle, FNP  [facility] [phone] (phone) [phone] (fax)  [facility]

## 2022-11-30 ENCOUNTER — Ambulatory Visit
Admission: RE | Admit: 2022-11-30 | Discharge: 2022-11-30 | Disposition: A | Discharge status: Home / Self Care | Payer: Managed Care, Other (non HMO) | Source: Ambulatory Visit | Attending: Family Medicine | Admitting: Family Medicine

## 2022-11-30 DIAGNOSIS — N61 Mastitis without abscess: Secondary | ICD-10-CM | POA: Diagnosis present

## 2022-12-08 ENCOUNTER — Ambulatory Visit (HOSPITAL_COMMUNITY): Payer: Managed Care, Other (non HMO) | Attending: Urology | Admitting: Physical Therapy

## 2022-12-08 DIAGNOSIS — R350 Frequency of micturition: Secondary | ICD-10-CM | POA: Diagnosis not present

## 2022-12-08 DIAGNOSIS — N3941 Urge incontinence: Secondary | ICD-10-CM | POA: Insufficient documentation

## 2022-12-08 DIAGNOSIS — R32 Unspecified urinary incontinence: Secondary | ICD-10-CM | POA: Insufficient documentation

## 2022-12-08 NOTE — Therapy (Signed)
OUTPATIENT PHYSICAL THERAPY FEMALE PELVIC EVALUATION   Patient Name: Anne Swanson MRN: [id number] DOB:November 08, 1960, 62 y.o., female Today's Date: 12/08/2022  END OF SESSION:  PT End of Session - 12/08/22 1558     Visit Number 1    Number of Visits 4    Date for PT Re-Evaluation 01/26/23    Authorization Type [employer]    Progress Note Due on Visit 6    PT Start Time 1520    PT Stop Time 1545    PT Time Calculation (min) 25 min    Activity Tolerance Patient tolerated treatment well             Past Medical History:  Diagnosis Date   Anemia    Asthma    Complication of anesthesia    ITCHING X 30 HOURS AFTER SPINAL 1990   Fibrocystic breast disease    High cholesterol    Migraines    Osteopenia    Vertigo    Past Surgical History:  Procedure Laterality Date   CESAREAN SECTION     x3   CHOLECYSTECTOMY N/A 08/06/2017   Procedure: LAPAROSCOPIC CHOLECYSTECTOMY WITH INTRAOPERATIVE CHOLANGIOGRAM;  Surgeon: Earline Mayotte, MD;  Location: [facility];  Service: General;  Laterality: N/A;   COLONOSCOPY     COLONOSCOPY WITH PROPOFOL N/A 03/02/2020   Procedure: COLONOSCOPY WITH PROPOFOL;  Surgeon: Midge Minium, MD;  Location: [facility];  Service: Endoscopy;  Laterality: N/A;   WISDOM TOOTH EXTRACTION     Patient Active Problem List   Diagnosis Date Noted   Digital mucinous cyst of finger 07/18/2022   Chest pain 02/27/2022   Gastroesophageal reflux disease without esophagitis 02/03/2021   Atrophic vaginitis 07/20/2020   Benign paroxysmal positional vertigo of right ear 03/30/2020   Polyp of descending colon    Calculus of gallbladder without cholecystitis without obstruction 06/29/2017   Hyperlipidemia 03/01/2015   Asthma 11/16/2014   Diffuse cystic mastopathy of breast 11/16/2014   History of anemia 11/16/2014   Osteopenia 11/16/2014   Vitamin D deficiency 11/16/2014    PCP: Shirlee Latch  REFERRING PROVIDER: Alfredo Martinez, MD  REFERRING DIAG:   Diagnosis  R32 (ICD-10-CM) - Urinary incontinence, unspecified type  R35.0 (ICD-10-CM) - Urinary frequency    THERAPY DIAG: Stress incontinence  Rationale for Evaluation and Treatment: Rehabilitation  ONSET DATE: 3 years with a hx of UTI's  SUBJECTIVE:                                                                                                                                                                                           SUBJECTIVE STATEMENT: Ms. Storz states that  she has an urgency to go to the bathroom all the time.  The pt states that she can go 30 minutes every time but she can be on a trip and hold it for five hours.  She drinks all the time 2-3 diet sodas every day and 2 bottles of water.  She state occasionally if she coughs or she might have some incontinence but this only occurs about once every 2-3 months    PAIN:  Are you having pain? No  PRECAUTIONS: None  No   PATIENT GOALS: To not have to go to the bathroom as frequent  PERTINENT HISTORY:   URINATION: Pain with urination: No Fully empty bladder: No Stream: Weak Urgency: Yes: every 30 minutes  Frequency: 30 minutes Leakage: Urge to void and Coughing Pads: No    OBJECTIVE:   POSTURE: No Significant postural limitations   Pt with significant weakened pelvic floor mm.   TODAY'S TREATMENT:                                                                                                                              DATE: 12/08/22  EVAL :  Educated to decrease caffeine beverages, educated in both quick flick and long hold kegals Quick :  hold 2" relax 4 x 10 Long hold:  hold 5" relax 10" x 2 (pt unable to hold the full 5 seconds on the third attempt. )    PATIENT EDUCATION:  Education details: caffeine is an irritant to the bladder, Pt need to strengthen there pelvic and abdominal floor mm Person educated: Patient Education method: Explanation, Verbal cues, and Handouts Education  comprehension: verbalized understanding  HOME EXERCISE PROGRAM: Voiding schedule Kegal exercises   ASSESSMENT:  CLINICAL IMPRESSION: Patient is a 62 y.o. female who was seen today for physical therapy evaluation and treatment for stress/urge incontinence. The pt states that incontinence is not what is concerning her as she only leaks about once every 3-4 week when she is laughing or has not gone to the bathroom long for awhile.  She is more bothered but the urge to go to the bathroom once every 30 minutes.  PT states when she goes her stream will be weak.  Ms. Marquita Palms will benefit from skilled PT to educate in creating a voiding schedule, avoiding bladder irritants as well as kegals.     OBJECTIVE IMPAIRMENTS: decreased strength and frequent urination  .   REHAB POTENTIAL: Good  CLINICAL DECISION MAKING: Stable/uncomplicated  EVALUATION COMPLEXITY: Low   GOALS: Goals reviewed with patient? No  SHORT TERM GOALS: Target date: 9/13  PT to be able to wait 21/2 hours prior to feeling  pressure to  urinate. Baseline: Goal status: INITIAL  2.  PT completing her Kegals on a daily basis. Baseline:  Goal status: INITIAL  LONG TERM GOALS: Target date: 01/26/23  PT to be able to wait 4 hrs prior to feeling pressure to urinate.  Baseline:  Goal status:  INITIAL  2.  PT to state that she has not had a leak for 6 weeks  Baseline:  Goal status: INITIAL  PLAN:  PT FREQUENCY: every other week  PT DURATION: 4 weeks  PLANNED INTERVENTIONS: Therapeutic exercises, Patient/Family education, and Self Care  PLAN FOR NEXT SESSION: heel slides, hip abduction, curls, increase long hold kegal as appropriate  Virgina Organ, PT CLT [phone]  12/08/2022, 4:12 PM

## 2022-12-29 ENCOUNTER — Encounter (HOSPITAL_COMMUNITY): Payer: Managed Care, Other (non HMO) | Admitting: Physical Therapy

## 2023-01-19 ENCOUNTER — Encounter (HOSPITAL_COMMUNITY): Payer: Managed Care, Other (non HMO) | Admitting: Physical Therapy

## 2023-02-02 ENCOUNTER — Encounter (HOSPITAL_COMMUNITY): Payer: Managed Care, Other (non HMO) | Admitting: Physical Therapy

## 2023-02-16 ENCOUNTER — Encounter (HOSPITAL_COMMUNITY): Payer: Managed Care, Other (non HMO) | Admitting: Physical Therapy

## 2023-03-08 ENCOUNTER — Encounter: Payer: Managed Care, Other (non HMO) | Admitting: Family Medicine

## 2023-03-28 ENCOUNTER — Ambulatory Visit (INDEPENDENT_AMBULATORY_CARE_PROVIDER_SITE_OTHER): Payer: Managed Care, Other (non HMO) | Admitting: Family Medicine

## 2023-03-28 VITALS — BP 123/76 | HR 76 | Resp 16 | Ht 62.0 in | Wt 118.4 lb

## 2023-03-28 DIAGNOSIS — Z8249 Family history of ischemic heart disease and other diseases of the circulatory system: Secondary | ICD-10-CM

## 2023-03-28 DIAGNOSIS — E559 Vitamin D deficiency, unspecified: Secondary | ICD-10-CM

## 2023-03-28 DIAGNOSIS — M8580 Other specified disorders of bone density and structure, unspecified site: Secondary | ICD-10-CM | POA: Diagnosis not present

## 2023-03-28 DIAGNOSIS — Z0000 Encounter for general adult medical examination without abnormal findings: Secondary | ICD-10-CM | POA: Diagnosis not present

## 2023-03-28 DIAGNOSIS — E782 Mixed hyperlipidemia: Secondary | ICD-10-CM | POA: Diagnosis not present

## 2023-03-28 NOTE — Patient Instructions (Addendum)
Please review the attached list of medications and notify my office if there are any errors.   I recommend that you get the Prevnar 20 vaccine to protect yourself from certain dangerous strains of pneumonia. You can get Prevnar 20 at your pharmacy, or call our office at [phone] at your earliest convenience to schedule this vaccine.   The CDC recommends two doses of Shingrix (the shingles vaccine) separated by 2 to 6 months for adults age 62 years and older. I recommend checking with your insurance plan regarding coverage for this vaccine.    

## 2023-03-28 NOTE — Progress Notes (Signed)
Complete physical exam   Patient: Anne Swanson   DOB: 1960-06-09   62 y.o. Female  MRN: [id number] Visit Date: 03/28/2023  Today's healthcare provider: Mila Merry, MD   Chief Complaint  Patient presents with   Annual Exam   Subjective    Discussed the use of AI scribe software for clinical note transcription with the patient, who gave verbal consent to proceed.  History of Present Illness   The patient, with a history of asthma and osteopenia, presents for a routine physical. She reports a recent hip injury sustained three weeks ago while stretching in the car, which has been gradually improving. She denies any other associated symptoms.   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 The patient enjoys a diet that includes salads, vegetables, and occasional fried foods and beef. She has not seen her dermatologist, Dr. Gwen Pounds, in several years.  The patient also reports elevated blood pressure over the past three months, which she attributes to stress related to her upcoming retirement. She has a history of COVID-19 infection in September, with residual cough and left-sided pain. She denies any breathing difficulties or chest pain. Albuterol inhaler is used infrequently, approximately once or twice a month.  She has not been taking her prescribed Vitamin D supplement, despite a history of low levels. The patient has a history of osteopenia and is considering the use of Vitamin D supplements due to its potential benefits for bone health.   She also reports not using the prescribed estrogen cream due to concerns about hormonal effects. The patient has not taken meclizine in approximately six months.   She reports a decrease in fibrocystic changes in her breasts since reducing caffeine intake. She has not noticed any new lumps or soreness      Lab Results  Component Value Date   CHOL 225 (H) 03/08/2022   HDL 55 03/08/2022   LDLCALC 157 (H) 03/08/2022   TRIG 77 03/08/2022   CHOLHDL 4.6 (H) 10/10/2021   Lab Results  Component Value Date   VD25OH 26.2 (L) 03/08/2022      Past Medical History:  Diagnosis Date   Anemia    Asthma    Complication of anesthesia    ITCHING X 30 HOURS AFTER SPINAL 1990   Fibrocystic breast disease    High cholesterol    Migraines    Osteopenia    Vertigo    Past Surgical History:  Procedure Laterality Date   CESAREAN SECTION     x3   CHOLECYSTECTOMY N/A 08/06/2017   Procedure: LAPAROSCOPIC CHOLECYSTECTOMY WITH INTRAOPERATIVE CHOLANGIOGRAM;  Surgeon: Earline Mayotte, MD;  Location: [facility];  Service: General;  Laterality: N/A;   COLONOSCOPY     COLONOSCOPY WITH PROPOFOL N/A 03/02/2020   Procedure: COLONOSCOPY WITH PROPOFOL;  Surgeon: Midge Minium, MD;  Location: [facility];  Service: Endoscopy;  Laterality: N/A;   WISDOM TOOTH EXTRACTION     Social History   Socioeconomic History   Marital status: Married    Spouse name: Not on file   Number of children: 3   Years of education: Not on file   Highest education level: Bachelor's degree (e.g., BA, AB, BS)  Occupational History   Occupation: Accountant    Comment: Self employed  Tobacco Use   Smoking status: Never   Smokeless tobacco:  Never  Vaping Use   Vaping status: Never Used  Substance and Sexual Activity   Alcohol use: Yes    Alcohol/week: 0.0 standard drinks of alcohol    Comment: once a month   Drug use: No   Sexual activity: Yes    Birth control/protection: Post-menopausal  Other Topics Concern   Not on file  Social History Narrative   Not on file   Social Determinants of Health   Financial Resource Strain: Low Risk  (03/28/2023)   Overall Financial Resource Strain (CARDIA)    Difficulty of Paying Living Expenses: Not hard at all  Food Insecurity: No Food Insecurity (03/28/2023)   Hunger Vital Sign     Worried About Running Out of Food in the Last Year: Never true    Ran Out of Food in the Last Year: Never true  Transportation Needs: No Transportation Needs (03/28/2023)   PRAPARE - Administrator, Civil Service (Medical): No    Lack of Transportation (Non-Medical): No  Physical Activity: Inactive (03/28/2023)   Exercise Vital Sign    Days of Exercise per Week: 0 days    Minutes of Exercise per Session: 40 min  Stress: Stress Concern Present (03/28/2023)   [employer] of Occupational Health - Occupational Stress Questionnaire    Feeling of Stress : To some extent  Social Connections: Socially Integrated (03/28/2023)   Social Connection and Isolation Panel [NHANES]    Frequency of Communication with Friends and Family: Three times a week    Frequency of Social Gatherings with Friends and Family: Twice a week    Attends Religious Services: More than 4 times per year    Active Member of [employer] or Organizations: Yes    Attends Engineer, structural: More than 4 times per year    Marital Status: Married  Catering manager Violence: Not on file   Family Status  Relation Name Status   Mother  Deceased at age 93       Breast cancer and colon cancer   Father  Deceased at age 44       died from MI   Brother  Deceased at age 81       fdied from Leukemia   Mat Uncle  (Not Specified)   Other Mat Great Grandmother Deceased  No partnership data on file   Family History  Problem Relation Age of Onset   Colon cancer Mother    Breast cancer Mother    Coronary artery disease Father    Schizophrenia Father    Leukemia Brother    Stomach cancer Maternal Uncle    Breast cancer Other    Allergies  Allergen Reactions   Lodine [Etodolac] Other (See Comments)    Facial numbness    Patient Care Team: Erasmo Downer, MD as PCP - General (Family Medicine)   Medications: Outpatient Medications Prior to Visit  Medication Sig   albuterol (VENTOLIN HFA) 108  (90 Base) MCG/ACT inhaler INHALE 2 PUFFS BY MOUTH EVERY 6 HOURS AS NEEDED FOR WHEEZE   meclizine (ANTIVERT) 25 MG tablet Take 1 tablet (25 mg total) by mouth 3 (three) times daily as needed for dizziness.   estradiol (ESTRACE) 0.1 MG/GM vaginal cream Estrogen Cream Instruction Discard applicator Apply pea sized amount to tip of finger to urethra before bed. Wash hands well after application. Use Monday, Wednesday and Friday   loratadine (CLARITIN) 10 MG tablet Take 10 mg by mouth daily.   sulfamethoxazole-trimethoprim (BACTRIM DS) 800-160 MG  tablet Take 1 tablet by mouth 2 (two) times daily.   No facility-administered medications prior to visit.      Objective    BP 123/76 (BP Location: Left Arm, Patient Position: Sitting, Cuff Size: Normal)   Pulse 76   Resp 16   Ht 5\' 2"  (1.575 m)   Wt 118 lb 6.4 oz (53.7 kg)   LMP 02/15/2014   BMI 21.66 kg/m    Physical Exam  General Appearance:    Well developed, well nourished female. Alert, cooperative, in no acute distress, appears stated age   Head:    Normocephalic, without obvious abnormality, atraumatic  Eyes:    PERRL, conjunctiva/corneas clear, EOM's intact, fundi    benign, both eyes  Ears:    Normal TM's and external ear canals, both ears  Nose:   Nares normal, septum midline, mucosa normal, no drainage    or sinus tenderness  Throat:   Lips, mucosa, and tongue normal; teeth and gums normal  Neck:   Supple, symmetrical, trachea midline, no adenopathy;    thyroid:  no enlargement/tenderness/nodules; no carotid   bruit or JVD  Back:     Symmetric, no curvature, ROM normal, no CVA tenderness  Lungs:     Clear to auscultation bilaterally, respirations unlabored  Chest Wall:    No tenderness or deformity   Heart:    Normal heart rate. Normal rhythm. No murmurs, rubs, or gallops.   Breast Exam:    deferred  Abdomen:     Soft, non-tender, bowel sounds active all four quadrants,    no masses, no organomegaly  Pelvic:    deferred   Extremities:   All extremities are intact. No cyanosis or edema  Pulses:   2+ and symmetric all extremities  Skin:   Skin color, texture, turgor normal, no rashes or lesions  Lymph nodes:   Cervical, supraclavicular, and axillary nodes normal  Neurologic:   CNII-XII intact, normal strength, sensation and reflexes    throughout       Last depression screening scores    03/28/2023    9:47 AM 07/18/2022    2:59 PM 02/27/2022    4:02 PM  PHQ 2/9 Scores  PHQ - 2 Score 0 0 0  PHQ- 9 Score 1 2 1    Last fall risk screening    03/28/2023    9:47 AM  Fall Risk   Falls in the past year? 0  Number falls in past yr: 0  Injury with Fall? 0  Risk for fall due to : No Fall Risks   Last Audit-C alcohol use screening    03/28/2023    8:14 AM  Alcohol Use Disorder Test (AUDIT)  1. How often do you have a drink containing alcohol? 2  2. How many drinks containing alcohol do you have on a typical day when you are drinking? 0  3. How often do you have six or more drinks on one occasion? 0  AUDIT-C Score 2   A score of 3 or more in women, and 4 or more in men indicates increased risk for alcohol abuse, EXCEPT if all of the points are from question 1   No results found for any visits on 03/28/23.  Assessment & Plan    Routine Health Maintenance and Physical Exam  Exercise Activities and Dietary recommendations  Goals   None     Immunization History  Administered Date(s) Administered   Influenza,inj,Quad PF,6+ Mos 03/12/2017, 01/05/2020, 02/03/2021   Influenza-Unspecified 01/06/2022  PFIZER Comirnaty(Gray Top)Covid-19 Tri-Sucrose Vaccine 07/22/2020   PFIZER(Purple Top)SARS-COV-2 Vaccination 06/27/2019, 07/22/2019, 02/09/2020   Pfizer Covid-19 Vaccine Bivalent Booster 63yrs & up 07/22/2020, 02/26/2021, 01/06/2022   Pneumococcal Polysaccharide-23 01/05/2020   Tdap 03/19/2007, 01/05/2020    Health Maintenance  Topic Date Due   Zoster Vaccines- Shingrix (1 of 2) Never done    Pneumococcal Vaccine 18-73 Years old (2 of 2 - PCV) 01/04/2021   COVID-19 Vaccine (8 - 2023-24 season) 12/17/2022   INFLUENZA VACCINE  07/16/2023 (Originally 11/16/2022)   MAMMOGRAM  11/29/2024   Colonoscopy  03/02/2025   Cervical Cancer Screening (HPV/Pap Cotest)  04/25/2027   DTaP/Tdap/Td (3 - Td or Tdap) 01/04/2030   Hepatitis C Screening  Completed   HIV Screening  Completed   HPV VACCINES  Aged Out    Discussed health benefits of physical activity, and encouraged her to engage in regular exercise appropriate for her age and condition.     Hip Pain Self-limited injury with gradual improvement. No other associated symptoms. -Continue to monitor and consider referral to sports medicine if no further improvement.  Hypertension Recent elevated readings in the context of stress related to upcoming retirement. No other symptoms reported. -Monitor blood pressure and consider intervention if persistently elevated.  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 No other respiratory symptoms. -Recommend updated covid vaccine 3-6 months after recovery from covid infection.   Asthma Infrequent use of albuterol inhaler, approximately once or twice a month. No acute exacerbations reported. -Continue current management. Has had pneumovax, but not prevnar. Recommend she get this at her convenience.  Recommend Shingles vaccine at her convenience.   Osteopenia History of osteopenia. Recent cessation of caffeine intake. Vitamin D supplementation not consistent. -Encourage consistent Vitamin D supplementation. -Consider repeat bone density scan next year   Family history premature CAD -Order labs including cholesterol, LPA, ApoB, and thyroid function.  -Continue regular mammograms for breast cancer screening. She declined breast exam today.        Mila Merry, MD  [facility] [phone] (phone) [phone] (fax)  [facility]

## 2023-03-29 LAB — COMPREHENSIVE METABOLIC PANEL
ALT: 15 [IU]/L (ref 0–32)
AST: 19 [IU]/L (ref 0–40)
Albumin: 4.5 g/dL (ref 3.9–4.9)
Alkaline Phosphatase: 95 [IU]/L (ref 44–121)
BUN/Creatinine Ratio: 14 (ref 12–28)
BUN: 10 mg/dL (ref 8–27)
Bilirubin Total: 0.7 mg/dL (ref 0.0–1.2)
CO2: 25 mmol/L (ref 20–29)
Calcium: 9.4 mg/dL (ref 8.7–10.3)
Chloride: 102 mmol/L (ref 96–106)
Creatinine, Ser: 0.72 mg/dL (ref 0.57–1.00)
Globulin, Total: 2.2 g/dL (ref 1.5–4.5)
Glucose: 99 mg/dL (ref 70–99)
Potassium: 4 mmol/L (ref 3.5–5.2)
Sodium: 141 mmol/L (ref 134–144)
Total Protein: 6.7 g/dL (ref 6.0–8.5)
eGFR: 94 mL/min/{1.73_m2} (ref >59)

## 2023-03-29 LAB — LIPID PANEL
Chol/HDL Ratio: 4.1 {ratio} (ref 0.0–4.4)
Cholesterol, Total: 254 mg/dL — ABNORMAL HIGH (ref 100–199)
HDL: 62 mg/dL (ref >39)
LDL Chol Calc (NIH): 180 mg/dL — ABNORMAL HIGH (ref 0–99)
Triglycerides: 70 mg/dL (ref 0–149)
VLDL Cholesterol Cal: 12 mg/dL (ref 5–40)

## 2023-03-29 LAB — LIPOPROTEIN A (LPA): Lipoprotein (a): 90 nmol/L — ABNORMAL HIGH (ref <75.0)

## 2023-03-29 LAB — VITAMIN D 25 HYDROXY (VIT D DEFICIENCY, FRACTURES): Vit D, 25-Hydroxy: 27 ng/mL — ABNORMAL LOW (ref 30.0–100.0)

## 2023-03-29 LAB — TSH: TSH: 0.969 u[IU]/mL (ref 0.450–4.500)

## 2023-03-29 LAB — APOLIPOPROTEIN B: Apolipoprotein B: 127 mg/dL — ABNORMAL HIGH (ref <90)

## 2023-03-30 ENCOUNTER — Other Ambulatory Visit: Payer: Self-pay | Admitting: Family Medicine

## 2023-03-30 DIAGNOSIS — E559 Vitamin D deficiency, unspecified: Secondary | ICD-10-CM

## 2023-03-30 DIAGNOSIS — E7841 Elevated Lipoprotein(a): Secondary | ICD-10-CM

## 2023-03-30 DIAGNOSIS — E782 Mixed hyperlipidemia: Secondary | ICD-10-CM

## 2023-03-30 MED ORDER — ROSUVASTATIN CALCIUM 5 MG PO TABS: 5.0000 mg | ORAL_TABLET | Freq: Every day | ORAL | 3 refills | Status: DC

## 2023-05-14 ENCOUNTER — Ambulatory Visit (INDEPENDENT_AMBULATORY_CARE_PROVIDER_SITE_OTHER): Payer: Managed Care, Other (non HMO)

## 2023-05-14 ENCOUNTER — Other Ambulatory Visit (HOSPITAL_COMMUNITY)
Admission: RE | Admit: 2023-05-14 | Discharge: 2023-05-14 | Disposition: A | Discharge status: Home / Self Care | Payer: Managed Care, Other (non HMO) | Source: Ambulatory Visit

## 2023-05-14 VITALS — BP 116/72 | HR 77 | Ht 62.0 in | Wt 116.6 lb

## 2023-05-14 DIAGNOSIS — N898 Other specified noninflammatory disorders of vagina: Secondary | ICD-10-CM

## 2023-05-14 DIAGNOSIS — Z113 Encounter for screening for infections with a predominantly sexual mode of transmission: Secondary | ICD-10-CM | POA: Diagnosis present

## 2023-05-14 DIAGNOSIS — Z23 Encounter for immunization: Secondary | ICD-10-CM

## 2023-05-14 DIAGNOSIS — Z01419 Encounter for gynecological examination (general) (routine) without abnormal findings: Secondary | ICD-10-CM | POA: Diagnosis not present

## 2023-05-14 DIAGNOSIS — E782 Mixed hyperlipidemia: Secondary | ICD-10-CM

## 2023-05-14 DIAGNOSIS — N952 Postmenopausal atrophic vaginitis: Secondary | ICD-10-CM

## 2023-05-14 DIAGNOSIS — Z124 Encounter for screening for malignant neoplasm of cervix: Secondary | ICD-10-CM | POA: Diagnosis present

## 2023-05-14 MED ORDER — ESTRADIOL 0.1 MG/GM VA CREA: TOPICAL_CREAM | VAGINAL | 11 refills | Status: DC

## 2023-05-14 NOTE — Progress Notes (Signed)
Outpatient Gynecology Note: Annual Visit  Assessment/Plan:    Anne Swanson is a 63 y.o. female G3P3 with normal well-woman gynecologic exam.   Atrophic vaginitis Will refill estrace cream. Reinforced benefits and how to use.   Vaginal discharge Pap, vaginitis swab, and STI testing done.   Hyperlipidemia Discussed s/s of cardiovascular disease in women. Encouraged follow up with PCP regarding lipids and starting prescribed statin.     Orders Placed This Encounter  Procedures   Flu vaccine trivalent PF, 6mos and older(Flulaval,Afluria,Fluarix,Fluzone)   HEP, RPR, HIV Panel   Current Outpatient Medications  Medication Instructions   albuterol (VENTOLIN HFA) 108 (90 Base) MCG/ACT inhaler INHALE 2 PUFFS BY MOUTH EVERY 6 HOURS AS NEEDED FOR WHEEZE   estradiol (ESTRACE) 0.1 MG/GM vaginal cream Estrogen Cream Instruction Discard applicator Apply pea sized amount to tip of finger to urethra before bed. Wash hands well after application. Use Monday, Wednesday and Friday   meclizine (ANTIVERT) 25 mg, Oral, 3 times daily PRN   rosuvastatin (CRESTOR) 5 mg, Oral, Daily    Return in about 1 year (around 05/13/2024) for Annual physical.    Subjective:    Anne Swanson is a 63 y.o. female G3P3 who presents for annual wellness visit.  She is feeling well overall, but has noticed some discharge that she describes as orange in color. She denies vaginal itching and urinary symptoms, as well as change in sexual practices. She is retired as of this past Friday and is looking forward to retirement!  Occupation: retired.    Lives with husband.   Feels safe at home.  CONCERNS? Vaginal discharge, orange in color   Well Woman Visit:  GYN HISTORY:  Patient's last menstrual period was 02/15/2014.     Menstrual History: OB History     Gravida  3   Para  3   Term      Preterm      AB      Living         SAB      IAB      Ectopic      Multiple      Live Births  3         Obstetric Comments  Menstrual age: 44  Age 1st Pregnancy: 30         Patient's last menstrual period was 02/15/2014.   Vaginal bleeding: none   Urinary incontinence? none Sexually active: yes Number of sexual partners: 1  Gender of sexual Partners: male  Dyspareunia? None. Some dryness.  Last pap:was normal, 2024 History of abnormal Pap: no STI history: none STI/HIV testing or immunizations needed? Yes.    Contraceptive methods:  none, postmenopausal   Health Maintenance > Reviewed breast self-awareness > History of abnormal mammogram: No > Body mass index is 21.33 kg/m.  > Recent dental visit Yes.   > Seat Belt Use: Yes.   > Texting and driving? No. > Guns in the house No. > Concern for alcohol abuse? no   Tobacco or other drug use: denied. Tobacco Use: Low Risk  (03/28/2023)   Patient History    Smoking Tobacco Use: Never    Smokeless Tobacco Use: Never    Passive Exposure: Not on file     If >50:  Last mammogram: 2024 BIRADS-1 Due August 2025  Postmenopausal Last colonoscopy: due in 2026  Last DEXA: n/a  Last lipid screening: December of 2024; followed by her PCP  Hep C Screening: unknown  _________________________________________________________  Current Outpatient Medications  Medication Sig Dispense Refill   albuterol (VENTOLIN HFA) 108 (90 Base) MCG/ACT inhaler INHALE 2 PUFFS BY MOUTH EVERY 6 HOURS AS NEEDED FOR WHEEZE 8.5 each 6   meclizine (ANTIVERT) 25 MG tablet Take 1 tablet (25 mg total) by mouth 3 (three) times daily as needed for dizziness. 30 tablet 0   rosuvastatin (CRESTOR) 5 MG tablet Take 1 tablet (5 mg total) by mouth daily. 90 tablet 3   estradiol (ESTRACE) 0.1 MG/GM vaginal cream Estrogen Cream Instruction Discard applicator Apply pea sized amount to tip of finger to urethra before bed. Wash hands well after application. Use Monday, Wednesday and Friday 42.5 g 11   No current facility-administered medications for this visit.    Allergies  Allergen Reactions   Lodine [Etodolac] Other (See Comments)    Facial numbness    Past Medical History:  Diagnosis Date   Anemia    Asthma    Complication of anesthesia    ITCHING X 30 HOURS AFTER SPINAL 1990   Fibrocystic breast disease    High cholesterol    Migraines    Osteopenia    Vertigo    Past Surgical History:  Procedure Laterality Date   CESAREAN SECTION     x3   CHOLECYSTECTOMY N/A 08/06/2017   Procedure: LAPAROSCOPIC CHOLECYSTECTOMY WITH INTRAOPERATIVE CHOLANGIOGRAM;  Surgeon: Earline Mayotte, MD;  Location: [facility];  Service: General;  Laterality: N/A;   COLONOSCOPY     COLONOSCOPY WITH PROPOFOL N/A 03/02/2020   Procedure: COLONOSCOPY WITH PROPOFOL;  Surgeon: Midge Minium, MD;  Location: [facility];  Service: Endoscopy;  Laterality: N/A;   WISDOM TOOTH EXTRACTION     OB History     Gravida  3   Para  3   Term      Preterm      AB      Living         SAB      IAB      Ectopic      Multiple      Live Births  3        Obstetric Comments  Menstrual age: 10  Age 1st Pregnancy: 72        Social History   Tobacco Use   Smoking status: Never   Smokeless tobacco: Never  Substance Use Topics   Alcohol use: Yes    Alcohol/week: 0.0 standard drinks of alcohol    Comment: once a month   Social History   Substance and Sexual Activity  Sexual Activity Yes   Birth control/protection: Post-menopausal    Immunization History  Administered Date(s) Administered   Influenza, Seasonal, Injecte, Preservative Fre 05/14/2023   Influenza,inj,Quad PF,6+ Mos 03/12/2017, 01/05/2020, 02/03/2021   Influenza-Unspecified 01/06/2022   PFIZER Comirnaty(Gray Top)Covid-19 Tri-Sucrose Vaccine 07/22/2020   PFIZER(Purple Top)SARS-COV-2 Vaccination 06/27/2019, 07/22/2019, 02/09/2020   Pfizer Covid-19 Vaccine Bivalent Booster 67yrs & up 07/22/2020, 02/26/2021, 01/06/2022   Pneumococcal Polysaccharide-23 01/05/2020   Tdap 03/19/2007,  01/05/2020     Review Of Systems  Constitutional: Denied constitutional symptoms, night sweats, recent illness, fatigue, fever, insomnia and weight loss.  Eyes: Denied eye symptoms, eye pain, photophobia, vision change and visual disturbance.  Ears/Nose/Throat/Neck: Denied ear, nose, throat or neck symptoms, hearing loss, nasal discharge, sinus congestion and sore throat.  Cardiovascular: Denied cardiovascular symptoms, arrhythmia, chest pain/pressure, edema, exercise intolerance, orthopnea and palpitations.  Respiratory: Denied pulmonary symptoms, asthma, pleuritic pain, productive sputum, cough, dyspnea and wheezing.  Gastrointestinal: Denied, gastro-esophageal  reflux, melena, nausea and vomiting.  Genitourinary: Denied pelvic relaxation issues, and urinary complaints.  + vaginal discharge +mild erythema at posterior aspect of introitus +atrophic urethra   Musculoskeletal: Denied musculoskeletal symptoms, stiffness, swelling, muscle weakness and myalgia.  Dermatologic: Denied dermatology symptoms, rash and scar.  Neurologic: Denied neurology symptoms, dizziness, headache, neck pain and syncope.  Psychiatric: Denied psychiatric symptoms, anxiety and depression.  Endocrine: Denied endocrine symptoms including hot flashes and night sweats.      Objective:    BP 116/72 (BP Location: Left Arm, Patient Position: Sitting, Cuff Size: Normal)   Pulse 77   Ht 5\' 2"  (1.575 m)   Wt 52.9 kg   LMP 02/15/2014   BMI 21.33 kg/m   Constitutional: Well-developed, well-nourished female in no acute distress Neurological: Alert and oriented to person, place, and time Psychiatric: Mood and affect appropriate Skin: No rashes or lesions Neck: Supple without masses. Trachea is midline.Thyroid is normal size without masses Respiratory: Clear to auscultation bilaterally. Good air movement with normal work of breathing. Cardiovascular: Regular rate and rhythm. Extremities grossly normal, nontender with no  edema; pulses regular Gastrointestinal: Soft, nontender, nondistended. No masses or hernias appreciated. No hepatosplenomegaly. No fluid wave. No rebound or guarding. Breast Exam: normal appearance, no masses or tenderness Genitourinary:         External Genitalia: Normal female genitalia    Vagina: no lesions.    Cervix: No lesions, normal size and consistency   Perineum/Anus: No lesions Rectal: deferred   Cindra Eves, SNM  Seen in coordination with Autumn Messing, CNM  05/14/23 3:42 PM

## 2023-05-14 NOTE — Assessment & Plan Note (Signed)
Pap, vaginitis swab, and STI testing done.

## 2023-05-14 NOTE — Assessment & Plan Note (Signed)
Will refill estrace cream. Reinforced benefits and how to use.

## 2023-05-14 NOTE — Assessment & Plan Note (Signed)
Discussed s/s of cardiovascular disease in women. Encouraged follow up with PCP regarding lipids and starting prescribed statin.

## 2023-05-15 LAB — HEP, RPR, HIV PANEL
HIV Screen 4th Generation wRfx: NONREACTIVE
Hepatitis B Surface Ag: NEGATIVE
RPR Ser Ql: NONREACTIVE

## 2023-05-16 LAB — CYTOLOGY - PAP
Comment: NEGATIVE
Diagnosis: NEGATIVE
High risk HPV: NEGATIVE

## 2023-05-16 LAB — CERVICOVAGINAL ANCILLARY ONLY
Bacterial Vaginitis (gardnerella): NEGATIVE
Candida Glabrata: NEGATIVE
Candida Vaginitis: NEGATIVE
Chlamydia: NEGATIVE
Comment: NEGATIVE
Comment: NEGATIVE
Comment: NEGATIVE
Comment: NEGATIVE
Comment: NEGATIVE
Comment: NORMAL
Neisseria Gonorrhea: NEGATIVE
Trichomonas: NEGATIVE

## 2023-06-05 ENCOUNTER — Ambulatory Visit: Payer: Managed Care, Other (non HMO) | Admitting: Family Medicine

## 2023-06-05 ENCOUNTER — Other Ambulatory Visit: Payer: Self-pay | Admitting: Family Medicine

## 2023-06-05 VITALS — BP 126/68 | HR 82 | Resp 16 | Ht 62.0 in | Wt 120.0 lb

## 2023-06-05 DIAGNOSIS — R300 Dysuria: Secondary | ICD-10-CM

## 2023-06-05 LAB — POCT URINALYSIS DIPSTICK
Appearance: NORMAL
Bilirubin, UA: NEGATIVE
Blood, UA: NEGATIVE
Glucose, UA: NEGATIVE
Ketones, UA: NEGATIVE
Leukocytes, UA: NEGATIVE
Nitrite, UA: NEGATIVE
Protein, UA: NEGATIVE
Spec Grav, UA: <=1.005 — AB (ref 1.010–1.025)
Urobilinogen, UA: 0.2 U/dL
pH, UA: 6 (ref 5.0–8.0)

## 2023-06-05 MED ORDER — CEPHALEXIN 500 MG PO CAPS: 500.0000 mg | ORAL_CAPSULE | Freq: Two times a day (BID) | ORAL | 0 refills | Status: AC

## 2023-06-05 NOTE — Progress Notes (Signed)
 Patient ID: Anne Swanson, female    DOB: 1960-08-22, 63 y.o.   MRN: [id number]  PCP: Anne Downer, MD  Chief Complaint  Patient presents with   Dysuria    Started at 4am and has not stopped    Subjective:   Anne Swanson is a 63 y.o. female, presents to clinic with CC of the following:  HPI  Dysuria onset this am, severe, urgency, frequency, burning She has pushed clear fluids all day and it feels a little better right now She reports a dozen UTI's over the years, none recently She has vaginal estrogen creams on med list to be applied to urethra but she isn't using it/hasn't started it, she denies abd pain, flank pain, fever, N, V, D Results for orders placed or performed in visit on 06/05/23  POCT urinalysis dipstick   Collection Time: 06/05/23  3:58 PM  Result Value Ref Range   Color, UA Yellow    Clarity, UA Clear    Glucose, UA Negative Negative   Bilirubin, UA Negative    Ketones, UA Negative    Spec Grav, UA <=1.005 (A) 1.010 - 1.025   Blood, UA Negative    pH, UA 6.0 5.0 - 8.0   Protein, UA Negative Negative   Urobilinogen, UA 0.2 0.2 or 1.0 E.U./dL   Nitrite, UA Negative    Leukocytes, UA Negative Negative   Appearance Normal    Odor None    Dip unremarkable No recent hx of recurrent or complicated UTI per chart review   Patient Active Problem List   Diagnosis Date Noted   Vaginal discharge 05/14/2023   Digital mucinous cyst of finger 07/18/2022   Chest pain 02/27/2022   Gastroesophageal reflux disease without esophagitis 02/03/2021   Atrophic vaginitis 07/20/2020   Benign paroxysmal positional vertigo of right ear 03/30/2020   Polyp of descending colon    Calculus of gallbladder without cholecystitis without obstruction 06/29/2017   Hyperlipidemia 03/01/2015   Asthma 11/16/2014   Diffuse cystic mastopathy of breast 11/16/2014   History of anemia 11/16/2014   Osteopenia 11/16/2014   Vitamin D deficiency 11/16/2014      Current  Outpatient Medications:    albuterol (VENTOLIN HFA) 108 (90 Base) MCG/ACT inhaler, INHALE 2 PUFFS BY MOUTH EVERY 6 HOURS AS NEEDED FOR WHEEZE, Disp: 8.5 each, Rfl: 6   estradiol (ESTRACE) 0.1 MG/GM vaginal cream, Estrogen Cream Instruction Discard applicator Apply pea sized amount to tip of finger to urethra before bed. Wash hands well after application. Use Monday, Wednesday and Friday, Disp: 42.5 g, Rfl: 11   meclizine (ANTIVERT) 25 MG tablet, Take 1 tablet (25 mg total) by mouth 3 (three) times daily as needed for dizziness., Disp: 30 tablet, Rfl: 0   rosuvastatin (CRESTOR) 5 MG tablet, Take 1 tablet (5 mg total) by mouth daily., Disp: 90 tablet, Rfl: 3   Allergies  Allergen Reactions   Lodine [Etodolac] Other (See Comments)    Facial numbness     Social History   Tobacco Use   Smoking status: Never   Smokeless tobacco: Never  Vaping Use   Vaping status: Never Used  Substance Use Topics   Alcohol use: Yes    Alcohol/week: 0.0 standard drinks of alcohol    Comment: once a month   Drug use: No      Chart Review Today: I personally reviewed active problem list, medication list, allergies, family history, social history, health maintenance, notes from last encounter, lab results, imaging  with the patient/caregiver today.   Review of Systems  Constitutional: Negative.   HENT: Negative.    Eyes: Negative.   Respiratory: Negative.    Cardiovascular: Negative.   Gastrointestinal: Negative.   Endocrine: Negative.   Genitourinary: Negative.   Musculoskeletal: Negative.   Skin: Negative.   Allergic/Immunologic: Negative.   Neurological: Negative.   Hematological: Negative.   Psychiatric/Behavioral: Negative.    All other systems reviewed and are negative.      Objective:   Vitals:   06/05/23 1557  BP: 126/68  Pulse: 82  Resp: 16  SpO2: 98%  Weight: 120 lb (54.4 kg)  Height: 5\' 2"  (1.575 m)    Body mass index is 21.95 kg/m.  Physical Exam Vitals and nursing  note reviewed.  Constitutional:      General: She is not in acute distress.    Appearance: Normal appearance. She is well-developed. She is not ill-appearing, toxic-appearing or diaphoretic.  HENT:     Head: Normocephalic and atraumatic.     Nose: Nose normal.  Eyes:     General:        Right eye: No discharge.        Left eye: No discharge.     Conjunctiva/sclera: Conjunctivae normal.  Neck:     Trachea: No tracheal deviation.  Cardiovascular:     Rate and Rhythm: Normal rate and regular rhythm.  Pulmonary:     Effort: Pulmonary effort is normal. No respiratory distress.     Breath sounds: No stridor.  Abdominal:     General: There is no distension.     Palpations: Abdomen is soft.     Tenderness: There is no abdominal tenderness. There is no right CVA tenderness, left CVA tenderness, guarding or rebound.  Skin:    General: Skin is warm and dry.     Findings: No rash.  Neurological:     Mental Status: She is alert.     Motor: No abnormal muscle tone.     Coordination: Coordination normal.  Psychiatric:        Behavior: Behavior normal.      Results for orders placed or performed in visit on 06/05/23  POCT urinalysis dipstick   Collection Time: 06/05/23  3:58 PM  Result Value Ref Range   Color, UA Yellow    Clarity, UA Clear    Glucose, UA Negative Negative   Bilirubin, UA Negative    Ketones, UA Negative    Spec Grav, UA <=1.005 (A) 1.010 - 1.025   Blood, UA Negative    pH, UA 6.0 5.0 - 8.0   Protein, UA Negative Negative   Urobilinogen, UA 0.2 0.2 or 1.0 E.U./dL   Nitrite, UA Negative    Leukocytes, UA Negative Negative   Appearance Normal    Odor None        Assessment & Plan:   Dysuria - Plan: Urine Culture, POCT urinalysis dipstick Presumed simple UTI, pt prefers to start empiric abx today given severity of sx We will follow culture Abd exam benign today, VSS Start keflex today, she can use AZO as well for pain relief      Danelle Berry,  PA-C 06/05/23 4:11 PM

## 2023-06-06 LAB — URINE CULTURE
MICRO NUMBER:: 16100097
SPECIMEN QUALITY:: ADEQUATE

## 2023-06-07 ENCOUNTER — Encounter: Payer: Self-pay | Admitting: Family Medicine

## 2023-06-11 ENCOUNTER — Telehealth: Payer: Self-pay | Admitting: Family Medicine

## 2023-06-11 DIAGNOSIS — E7841 Elevated Lipoprotein(a): Secondary | ICD-10-CM

## 2023-06-11 DIAGNOSIS — E782 Mixed hyperlipidemia: Secondary | ICD-10-CM

## 2023-06-11 MED ORDER — ROSUVASTATIN CALCIUM 5 MG PO TABS: 5.0000 mg | ORAL_TABLET | Freq: Every day | ORAL | 3 refills | Status: DC

## 2023-06-11 NOTE — Telephone Encounter (Signed)
 [facility] pharmacy faxed refill request for the following medications:    rosuvastatin (CRESTOR) 5 MG tablet   Please advise

## 2023-06-13 ENCOUNTER — Encounter: Payer: Self-pay | Admitting: Family Medicine

## 2023-06-13 NOTE — Telephone Encounter (Unsigned)
 Copied from CRM [id number]. Topic: Appointments - Scheduling Inquiry for Clinic >> Jun 13, 2023 10:00 AM Gildardo Pounds wrote: Reason for CRM: Patient was advised to do a follow up with Danelle Berry for a urine culture and congestion. Callback number is [phone]

## 2023-06-14 ENCOUNTER — Ambulatory Visit: Payer: Managed Care, Other (non HMO) | Admitting: Family Medicine

## 2023-11-12 ENCOUNTER — Ambulatory Visit: Admitting: Family Medicine

## 2023-11-12 ENCOUNTER — Encounter: Payer: Self-pay | Admitting: Family Medicine

## 2023-11-12 VITALS — BP 111/70 | HR 80 | Temp 98.4°F | Ht 62.0 in | Wt 123.5 lb

## 2023-11-12 DIAGNOSIS — E559 Vitamin D deficiency, unspecified: Secondary | ICD-10-CM

## 2023-11-12 DIAGNOSIS — E782 Mixed hyperlipidemia: Secondary | ICD-10-CM

## 2023-11-12 NOTE — Progress Notes (Signed)
 Established patient visit   Patient: Anne Swanson   DOB: 07/02/1960   63 y.o. Female  MRN: [id number] Visit Date: 11/12/2023  Today's healthcare provider: Nancyann Perry, MD   Chief Complaint  Patient presents with   Procedure    Patient is having bunion correction on r foot, surgeon is wanting her to take rx vitamin D  since it has been low in the past. Patient wants to recheck vitamin D  to see if she needs rx vitamin d     Hyperlipidemia    Patent has questions about her medication rosuvastatin , wants to recheck cholesterol if able. Patient states after taking rosuvastatin  she had memory issues. Has been off of this med for about 4 weeks    Oral Swelling    Patient has had some lip swelling this week, onset x1 week. States this has happened several times over the past year    Subjective    Discussed the use of AI scribe software for clinical note transcription with the patient, who gave verbal consent to proceed.  History of Present Illness   Anne Swanson is a 63 year old female who presents for follow up vitamin d  deficiency and hyperlipidemia.  She is preparing for bunion surgery and her surgeon has recommended increasing her vitamin D  intake due to low levels. She has been taking over-the-counter vitamin D  supplements and spending time in the sun to improve her levels. The surgery is pending improvement in her vitamin D  status.  She discontinued rosuvastatin  on June 9th after experiencing cognitive issues, described as a 'memory fog', for six to eight weeks. She noted an incident where she failed to notice a car parked in a familiar location, which was unusual for her. After stopping the statin and diet drinks, she observed an improvement in her cognitive clarity within a couple of weeks. She had been on the statin since January but only started taking it a couple of months before discontinuation.  She is concerned about her cholesterol levels since stopping the statin  and is interested in checking her cholesterol and vitamin D  levels. 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 Her insurance coverage is a concern, as her COBRA insurance will end on September 30th.     This SmartLink has not been configured with any valid records.   Lab Results  Component Value Date   VD25OH 27.0 (L) 03/28/2023   Lab Results  Component Value Date   NA 141 03/28/2023   K 4.0 03/28/2023   CREATININE 0.72 03/28/2023   EGFR 94 03/28/2023   GLUCOSE 99 03/28/2023   Lab Results  Component Value Date   CHOL 254 (H) 03/28/2023   HDL 62 03/28/2023   LDLCALC 180 (H) 03/28/2023   TRIG 70 03/28/2023   CHOLHDL 4.1 03/28/2023     Medications: Outpatient Medications Prior to Visit  Medication Sig   albuterol  (VENTOLIN  HFA) 108 (90 Base) MCG/ACT inhaler INHALE 2 PUFFS BY MOUTH EVERY 6 HOURS AS NEEDED FOR WHEEZE   cholecalciferol (VITAMIN D3) 25 MCG (1000 UNIT) tablet Take 1,000 Units by mouth daily.   meclizine  (ANTIVERT ) 25 MG tablet Take 1 tablet (25 mg total) by mouth 3 (three) times daily as needed for dizziness.   estradiol  (ESTRACE ) 0.1 MG/GM vaginal cream Estrogen Cream Instruction Discard applicator Apply pea sized amount to tip of finger to urethra before bed. Wash  hands well after application. Use Monday, Wednesday and Friday (Patient not taking: Reported on 11/12/2023)   rosuvastatin  (CRESTOR ) 5 MG tablet Take 1 tablet (5 mg total) by mouth daily. (Patient not taking: Reported on 11/12/2023)   No facility-administered medications prior to visit.   Review of Systems     Objective    BP 111/70 (BP Location: Left Arm, Patient Position: Sitting, Cuff Size: Normal)   Pulse 80   Temp 98.4 F (36.9 C) (Oral)   Ht 5' 2 (1.575 m)   Wt 123 lb 8 oz (56 kg)   LMP 02/15/2014   SpO2 98%   BMI 22.59 kg/m   Physical Exam   General appearance: Well developed,  well nourished female, cooperative and in no acute distress Head: Normocephalic, without obvious abnormality, atraumatic Respiratory: Respirations even and unlabored, normal respiratory rate Extremities: All extremities are intact.  Skin: Skin color, texture, turgor normal. No rashes seen  Psych: Appropriate mood and affect. Neurologic: Mental status: Alert, oriented to person, place, and time, thought content appropriate.    Assessment & Plan    1. Mixed hyperlipidemia (Primary) Off rosuvastatin  due to possible cognitive side effects, although may also be related to artificial sweeteners in her drinks which she has since stopped consuming.   - Lipid panel  Anticipate change to atorvastatin unless her lipids are significantly lower.   2. Vitamin D  deficiency On 1000 units daily vitamin d3, but apparently her podiatrist wants levels normalized before schedule bunion surgery.  - Vitamin D  (25 hydroxy)      Nancyann Perry, MD  [facility] [phone] (phone) [phone] (fax)  [facility]

## 2023-11-14 LAB — LIPID PANEL

## 2023-11-16 LAB — VITAMIN D 25 HYDROXY (VIT D DEFICIENCY, FRACTURES): Vit D, 25-Hydroxy: 27.2 ng/mL — ABNORMAL LOW (ref 30.0–100.0)

## 2023-11-16 LAB — LIPID PANEL
Cholesterol, Total: 240 mg/dL — AB (ref 100–199)
HDL: 58 mg/dL (ref >39)
LDL CALC COMMENT:: 4.1 ratio (ref 0.0–4.4)
LDL Chol Calc (NIH): 168 mg/dL — AB (ref 0–99)
Triglycerides: 81 mg/dL (ref 0–149)
VLDL Cholesterol Cal: 14 mg/dL (ref 5–40)

## 2023-11-18 ENCOUNTER — Ambulatory Visit: Payer: Self-pay | Admitting: Family Medicine

## 2023-11-18 DIAGNOSIS — E782 Mixed hyperlipidemia: Secondary | ICD-10-CM

## 2023-11-27 MED ORDER — ATORVASTATIN CALCIUM 10 MG PO TABS: 10.0000 mg | ORAL_TABLET | Freq: Every day | ORAL | 1 refills | Status: DC

## 2023-12-03 ENCOUNTER — Ambulatory Visit: Admitting: Family Medicine

## 2023-12-19 ENCOUNTER — Other Ambulatory Visit: Payer: Self-pay | Admitting: Podiatry

## 2023-12-19 ENCOUNTER — Encounter: Payer: Self-pay | Admitting: Podiatry

## 2023-12-20 ENCOUNTER — Other Ambulatory Visit: Payer: Self-pay | Admitting: Family Medicine

## 2023-12-20 ENCOUNTER — Encounter: Payer: Self-pay | Admitting: Podiatry

## 2023-12-20 ENCOUNTER — Other Ambulatory Visit: Payer: Self-pay

## 2023-12-20 DIAGNOSIS — J45909 Unspecified asthma, uncomplicated: Secondary | ICD-10-CM

## 2023-12-20 NOTE — Anesthesia Preprocedure Evaluation (Addendum)
 Anesthesia Evaluation  Patient identified by MRN, date of birth, ID band Patient awake    Reviewed: Allergy & Precautions, H&P , NPO status , Patient's Chart, lab work & pertinent test results  History of Anesthesia Complications (+) history of anesthetic complications  Airway Mallampati: II  TM Distance: >3 FB Neck ROM: Full    Dental no notable dental hx.    Pulmonary neg pulmonary ROS, asthma    Pulmonary exam normal breath sounds clear to auscultation       Cardiovascular hypertension, Normal cardiovascular exam Rhythm:Regular Rate:Normal     Neuro/Psych  Headaches negative neurological ROS  negative psych ROS   GI/Hepatic negative GI ROS, Neg liver ROS,GERD  ,,  Endo/Other  negative endocrine ROS    Renal/GU negative Renal ROS  negative genitourinary   Musculoskeletal negative musculoskeletal ROS (+) Arthritis ,    Abdominal   Peds negative pediatric ROS (+)  Hematology negative hematology ROS (+) Blood dyscrasia, anemia   Anesthesia Other Findings Fibrocystic breast disease  Osteopenia Asthma  High cholesterol Vertigo  Anemia Migraines  Complication of anesthesia--itching after spinal GERD (gastroesophageal reflux disease) Hypertension Arthritis  Allergy    Reproductive/Obstetrics negative OB ROS                              Anesthesia Physical Anesthesia Plan  ASA: 2  Anesthesia Plan: General   Post-op Pain Management:    Induction: Intravenous  PONV Risk Score and Plan:   Airway Management Planned: LMA  Additional Equipment:   Intra-op Plan:   Post-operative Plan: Extubation in OR  Informed Consent: I have reviewed the patients History and Physical, chart, labs and discussed the procedure including the risks, benefits and alternatives for the proposed anesthesia with the patient or authorized representative who has indicated his/her understanding and  acceptance.     Dental Advisory Given  Plan Discussed with: Anesthesiologist, CRNA and Surgeon  Anesthesia Plan Comments: (Patient consented for risks of anesthesia including but not limited to:  - adverse reactions to medications - damage to eyes, teeth, lips or other oral mucosa - nerve damage due to positioning  - sore throat or hoarseness - Damage to heart, brain, nerves, lungs, other parts of body or loss of life  Patient voiced understanding and assent.)        Anesthesia Quick Evaluation

## 2023-12-25 NOTE — Discharge Instructions (Signed)
 [facility]  POST OPERATIVE INSTRUCTIONS FOR DR. ASHLEY AND DR. BAKER [facility] PODIATRY DEPARTMENT   Take your medication as prescribed.  Pain medication should be taken only as needed.  Keep the dressing clean, dry and intact.  Keep your foot elevated above the heart level for the first 48 hours.  Walking to the bathroom and brief periods of walking are acceptable, unless we have instructed you to be non-weight bearing.  Always wear your post-op shoe when walking.  Always use your crutches if you are to be non-weight bearing.  Do not take a shower. Baths are permissible as long as the foot is kept out of the water.   Every hour you are awake:  Bend your knee 15 times. Flex foot 15 times Massage calf 15 times  Call [facility] [phone]) if any of the following problems occur: You develop a temperature or fever. The bandage becomes saturated with blood. Medication does not stop your pain. Injury of the foot occurs. Any symptoms of infection including redness, odor, or red streaks running from wound.

## 2023-12-27 ENCOUNTER — Ambulatory Visit: Payer: Self-pay | Admitting: Anesthesiology

## 2023-12-27 ENCOUNTER — Ambulatory Visit: Payer: Self-pay

## 2023-12-27 ENCOUNTER — Encounter: Payer: Self-pay | Admitting: Podiatry

## 2023-12-27 ENCOUNTER — Other Ambulatory Visit: Payer: Self-pay

## 2023-12-27 ENCOUNTER — Ambulatory Visit
Admission: RE | Admit: 2023-12-27 | Discharge: 2023-12-27 | Disposition: A | Discharge status: Home / Self Care | Attending: Podiatry | Admitting: Podiatry

## 2023-12-27 ENCOUNTER — Encounter
Admission: RE | Disposition: A | Discharge status: Home / Self Care | Payer: Self-pay | Source: Home / Self Care | Attending: Podiatry

## 2023-12-27 DIAGNOSIS — K219 Gastro-esophageal reflux disease without esophagitis: Secondary | ICD-10-CM | POA: Insufficient documentation

## 2023-12-27 DIAGNOSIS — M2041 Other hammer toe(s) (acquired), right foot: Secondary | ICD-10-CM | POA: Insufficient documentation

## 2023-12-27 DIAGNOSIS — I10 Essential (primary) hypertension: Secondary | ICD-10-CM | POA: Insufficient documentation

## 2023-12-27 DIAGNOSIS — J45909 Unspecified asthma, uncomplicated: Secondary | ICD-10-CM | POA: Insufficient documentation

## 2023-12-27 DIAGNOSIS — D649 Anemia, unspecified: Secondary | ICD-10-CM | POA: Diagnosis not present

## 2023-12-27 DIAGNOSIS — M2011 Hallux valgus (acquired), right foot: Secondary | ICD-10-CM | POA: Diagnosis present

## 2023-12-27 DIAGNOSIS — M216X1 Other acquired deformities of right foot: Secondary | ICD-10-CM | POA: Insufficient documentation

## 2023-12-27 DIAGNOSIS — M79671 Pain in right foot: Secondary | ICD-10-CM | POA: Diagnosis present

## 2023-12-27 HISTORY — PX: HAMMER TOE SURGERY: SHX385

## 2023-12-27 HISTORY — PX: HALLUX VALGUS LAPIDUS: SHX6626

## 2023-12-27 SURGERY — BUNIONECTOMY, LAPIDUS
Anesthesia: General | Site: Second Toe | Laterality: Right

## 2023-12-27 MED ORDER — MIDAZOLAM HCL 2 MG/2ML IJ SOLN
INTRAMUSCULAR | Status: AC
  Filled 2023-12-27: qty 2

## 2023-12-27 MED ORDER — DEXAMETHASONE SODIUM PHOSPHATE 10 MG/ML IJ SOLN
INTRAMUSCULAR | Status: AC
  Filled 2023-12-27: qty 1

## 2023-12-27 MED ORDER — OXYCODONE-ACETAMINOPHEN 5-325 MG PO TABS: 1.0000 | ORAL_TABLET | Freq: Four times a day (QID) | ORAL | 0 refills | Status: AC | PRN

## 2023-12-27 MED ORDER — EPHEDRINE SULFATE (PRESSORS) 50 MG/ML IJ SOLN
INTRAMUSCULAR | Status: DC | PRN
  Administered 2023-12-27 (×2): 5 mg via INTRAVENOUS

## 2023-12-27 MED ORDER — MIDAZOLAM HCL 5 MG/5ML IJ SOLN
INTRAMUSCULAR | Status: DC | PRN
  Administered 2023-12-27: .5 mg via INTRAVENOUS

## 2023-12-27 MED ORDER — ASPIRIN 81 MG PO TBEC: 81.0000 mg | DELAYED_RELEASE_TABLET | Freq: Two times a day (BID) | ORAL | 0 refills | Status: DC

## 2023-12-27 MED ORDER — FENTANYL CITRATE (PF) 100 MCG/2ML IJ SOLN
INTRAMUSCULAR | Status: DC | PRN
  Administered 2023-12-27: 25 ug via INTRAVENOUS

## 2023-12-27 MED ORDER — FENTANYL CITRATE (PF) 100 MCG/2ML IJ SOLN: 100.0000 ug | Freq: Once | INTRAMUSCULAR | Status: DC

## 2023-12-27 MED ORDER — DEXAMETHASONE SODIUM PHOSPHATE 4 MG/ML IJ SOLN
INTRAMUSCULAR | Status: DC | PRN
  Administered 2023-12-27: 4 mg via INTRAVENOUS

## 2023-12-27 MED ORDER — CEFAZOLIN SODIUM-DEXTROSE 2-3 GM-%(50ML) IV SOLR
INTRAVENOUS | Status: AC
  Filled 2023-12-27: qty 50

## 2023-12-27 MED ORDER — FENTANYL CITRATE (PF) 100 MCG/2ML IJ SOLN
INTRAMUSCULAR | Status: AC
  Filled 2023-12-27: qty 2

## 2023-12-27 MED ORDER — ONDANSETRON HCL 4 MG/2ML IJ SOLN
INTRAMUSCULAR | Status: DC | PRN
Start: 2023-12-27 — End: 2023-12-27
  Administered 2023-12-27: 4 mg via INTRAVENOUS

## 2023-12-27 MED ORDER — CEFAZOLIN SODIUM-DEXTROSE 2-4 GM/100ML-% IV SOLN
2.0000 g | INTRAVENOUS | Status: AC
Start: 2023-12-27 — End: 2023-12-27
  Administered 2023-12-27: 2 g via INTRAVENOUS

## 2023-12-27 MED ORDER — DEXAMETHASONE SODIUM PHOSPHATE 4 MG/ML IJ SOLN
INTRAMUSCULAR | Status: AC
  Filled 2023-12-27: qty 1

## 2023-12-27 MED ORDER — BUPIVACAINE HCL 0.25 % IJ SOLN
INTRAMUSCULAR | Status: AC
  Filled 2023-12-27: qty 1

## 2023-12-27 MED ORDER — DEXAMETHASONE SODIUM PHOSPHATE 10 MG/ML IJ SOLN
INTRAMUSCULAR | Status: DC | PRN
Start: 2023-12-27 — End: 2023-12-27
  Administered 2023-12-27: 5 mg via PERINEURAL

## 2023-12-27 MED ORDER — BUPIVACAINE LIPOSOME 1.3 % IJ SUSP
INTRAMUSCULAR | Status: DC | PRN
  Administered 2023-12-27 (×2): 10 mL via PERINEURAL

## 2023-12-27 MED ORDER — SEVOFLURANE IN SOLN
RESPIRATORY_TRACT | Status: AC
Start: 2023-12-27 — End: 2023-12-27
  Filled 2023-12-27: qty 250

## 2023-12-27 MED ORDER — BUPIVACAINE LIPOSOME 1.3 % IJ SUSP
INTRAMUSCULAR | Status: AC
  Filled 2023-12-27: qty 10

## 2023-12-27 MED ORDER — ONDANSETRON HCL 4 MG/2ML IJ SOLN
INTRAMUSCULAR | Status: AC
Start: 2023-12-27 — End: 2023-12-27
  Filled 2023-12-27: qty 2

## 2023-12-27 MED ORDER — LIDOCAINE HCL (PF) 2 % IJ SOLN
INTRAMUSCULAR | Status: AC
  Filled 2023-12-27: qty 2

## 2023-12-27 MED ORDER — BUPIVACAINE HCL (PF) 0.25 % IJ SOLN
INTRAMUSCULAR | Status: DC | PRN
  Administered 2023-12-27 (×2): 10 mL via PERINEURAL

## 2023-12-27 MED ORDER — LIDOCAINE HCL (CARDIAC) PF 100 MG/5ML IV SOSY
PREFILLED_SYRINGE | INTRAVENOUS | Status: DC | PRN
  Administered 2023-12-27: 100 mg via INTRATRACHEAL

## 2023-12-27 MED ORDER — ONDANSETRON HCL 4 MG PO TABS: 4.0000 mg | ORAL_TABLET | Freq: Three times a day (TID) | ORAL | 0 refills | Status: DC | PRN

## 2023-12-27 MED ORDER — LACTATED RINGERS IV SOLN: INTRAVENOUS | Status: DC

## 2023-12-27 MED ORDER — LIDOCAINE HCL (PF) 2 % IJ SOLN
INTRAMUSCULAR | Status: AC
  Filled 2023-12-27: qty 5

## 2023-12-27 MED ORDER — PROPOFOL 10 MG/ML IV BOLUS
INTRAVENOUS | Status: DC | PRN
Start: 2023-12-27 — End: 2023-12-27
  Administered 2023-12-27: 200 mg via INTRAVENOUS

## 2023-12-27 MED ORDER — 0.9 % SODIUM CHLORIDE (POUR BTL) OPTIME
TOPICAL | Status: DC | PRN
  Administered 2023-12-27: 500 mL

## 2023-12-27 MED ORDER — LIDOCAINE HCL (PF) 1 % IJ SOLN
INTRAMUSCULAR | Status: DC | PRN
  Administered 2023-12-27 (×2): .5 mL via INTRADERMAL

## 2023-12-27 MED ORDER — MIDAZOLAM HCL 2 MG/2ML IJ SOLN: 2.0000 mg | Freq: Once | INTRAMUSCULAR | Status: DC

## 2023-12-27 SURGICAL SUPPLY — 37 items
BENZOIN TINCTURE PRP APPL 2/3 (GAUZE/BANDAGES/DRESSINGS) IMPLANT
BLADE OSC/SAGITTAL MD 5.5X18 (BLADE) IMPLANT
BLADE SAW LAPIPLASTY 40X11 (BLADE) IMPLANT
BLADE SURG 15 STRL LF DISP TIS (BLADE) IMPLANT
BLADE SW THK.38XMED LNG THN (BLADE) IMPLANT
BNDG COHESIVE 4X5 TAN STRL LF (GAUZE/BANDAGES/DRESSINGS) ×2 IMPLANT
BNDG ELASTIC 4X5.8 VLCR NS LF (GAUZE/BANDAGES/DRESSINGS) ×2 IMPLANT
BNDG ELASTIC 6X5.8 VLCR NS LF (GAUZE/BANDAGES/DRESSINGS) ×2 IMPLANT
BNDG ESMARCH 4X12 STRL LF (GAUZE/BANDAGES/DRESSINGS) ×2 IMPLANT
BNDG GAUZE DERMACEA FLUFF 4 (GAUZE/BANDAGES/DRESSINGS) ×2 IMPLANT
BNDG STRETCH 4X75 STRL LF (GAUZE/BANDAGES/DRESSINGS) ×2 IMPLANT
CANISTER SUCT 1200ML W/VALVE (MISCELLANEOUS) ×2 IMPLANT
COVER LIGHT HANDLE UNIVERSAL (MISCELLANEOUS) ×4 IMPLANT
DRAPE FLUOR MINI C-ARM 54X84 (DRAPES) ×2 IMPLANT
DURAPREP 26ML APPLICATOR (WOUND CARE) ×2 IMPLANT
ELECTRODE REM PT RTRN 9FT ADLT (ELECTROSURGICAL) ×2 IMPLANT
GAUZE SPONGE 4X4 12PLY STRL (GAUZE/BANDAGES/DRESSINGS) ×2 IMPLANT
GAUZE XEROFORM 1X8 LF (GAUZE/BANDAGES/DRESSINGS) ×2 IMPLANT
GLOVE BIOGEL PI IND STRL 7.5 (GLOVE) ×2 IMPLANT
GLOVE SURG SS PI 7.0 STRL IVOR (GLOVE) ×2 IMPLANT
GOWN STRL REUS W/ TWL LRG LVL3 (GOWN DISPOSABLE) ×4 IMPLANT
KIT TURNOVER KIT A (KITS) ×2 IMPLANT
KWIRE DBL END TROCAR 6X.045 (WIRE) IMPLANT
NS IRRIG 500ML POUR BTL (IV SOLUTION) ×2 IMPLANT
PACK EXTREMITY ARMC (MISCELLANEOUS) ×2 IMPLANT
PADDING CAST BLEND 4X4 NS (MISCELLANEOUS) ×6 IMPLANT
PENCIL SMOKE EVACUATOR (MISCELLANEOUS) ×2 IMPLANT
PIN BALLS 3/8 F/.045 WIRE (MISCELLANEOUS) ×2 IMPLANT
RASP SM TEAR CROSS CUT (RASP) IMPLANT
SCREW 2.7 HIGH PITCH LOCKING (Screw) IMPLANT
SPLINT CAST 1 STEP 4X30 (MISCELLANEOUS) ×2 IMPLANT
STOCKINETTE IMPERVIOUS LG (DRAPES) ×2 IMPLANT
STRIP CLOSURE SKIN 1/4X4 (GAUZE/BANDAGES/DRESSINGS) IMPLANT
SUT VIC AB 4-0 SH 27XANBCTRL (SUTURE) IMPLANT
SUT VICRYL 3-0 27IN (SUTURE) IMPLANT
SUTURE MNCRL 4-0 27XMF (SUTURE) IMPLANT
SYSTEM LAPIPLASTY 4A (Orthopedic Implant) IMPLANT

## 2023-12-27 NOTE — Anesthesia Postprocedure Evaluation (Signed)
 Anesthesia Post Note  Patient: Anne Swanson  Procedure(s) Performed: ROMAYNE LOOK (Right) CORRECTION, HAMMER TOE (Right: Second Toe)  Patient location during evaluation: PACU Anesthesia Type: General Level of consciousness: awake and alert Pain management: pain level controlled Vital Signs Assessment: post-procedure vital signs reviewed and stable Respiratory status: spontaneous breathing, nonlabored ventilation, respiratory function stable and patient connected to nasal cannula oxygen Cardiovascular status: blood pressure returned to baseline and stable Postop Assessment: no apparent nausea or vomiting Anesthetic complications: no   No notable events documented.   Last Vitals:  Vitals:   12/27/23 1153 12/27/23 1504  BP:  122/61  Pulse: 63 85  Resp: 10 (!) 22  Temp:  36.6 C  SpO2: 100% 99%    Last Pain:  Vitals:   12/27/23 1504  PainSc: Asleep                 Jashan Cotten C Lorain Keast

## 2023-12-27 NOTE — H&P (Addendum)
 HISTORY AND PHYSICAL INTERVAL NOTE:  12/27/2023  11:48 AM  Anne Swanson  has presented today for surgery, with the diagnosis of Hallux valgus, right M20.11 Right foot pain M79.671 Plantar flexed metatarsal, right M21.6X1 Hammer toe of right foot M20.41.  The various methods of treatment have been discussed with the patient.  No guarantees were given.  After consideration of risks, benefits and other options for treatment, the patient has consented to surgery.  I have reviewed the patients' chart and labs.    PROCEDURE: RIGHT LAPIDUS BUNIONECTOMY WITH AKIN OSTEOTOMY RIGHT 2ND METATARSAL WEIL OSTEOTOMY RIGHT 2ND HAMMER TOE REPAIR RIGHT 2ND TOE FLEXOR TENDON TRANSFER  A history and physical examination was performed in my office.  The patient was reexamined.  There have been no changes to this history and physical examination.  Prentice Lee, DPM

## 2023-12-27 NOTE — Anesthesia Postprocedure Evaluation (Signed)
 Anesthesia Post Note  Patient: Anne Swanson  Procedure(s) Performed: Anne Swanson (Right) CORRECTION, HAMMER TOE (Right: Second Toe)  Patient location during evaluation: PACU Anesthesia Type: General Level of consciousness: awake and alert Pain management: pain level controlled Vital Signs Assessment: post-procedure vital signs reviewed and stable Respiratory status: spontaneous breathing, nonlabored ventilation, respiratory function stable and patient connected to nasal cannula oxygen Cardiovascular status: blood pressure returned to baseline and stable Postop Assessment: no apparent nausea or vomiting Anesthetic complications: no   No notable events documented.   Last Vitals:  Vitals:   12/27/23 1153 12/27/23 1504  BP:  122/61  Pulse: 63 85  Resp: 10 (!) 22  Temp:  36.6 C  SpO2: 100% 99%    Last Pain:  Vitals:   12/27/23 1504  PainSc: Asleep                 Anne Swanson

## 2023-12-27 NOTE — Anesthesia Procedure Notes (Addendum)
 Anesthesia Regional Block: Popliteal block   Pre-Anesthetic Checklist: , timeout performed,  Correct Patient, Correct Site, Correct Laterality,  Correct Procedure, Correct Position, site marked,  Risks and benefits discussed,  Surgical consent,  Pre-op evaluation,  At surgeon's request and post-op pain management  Laterality: Lower and Right  Prep: chloraprep       Needles:  Injection technique: Single-shot  Needle Type: Echogenic Needle     Needle Length: 9cm  Needle Gauge: 21     Additional Needles:   Procedures:,,,, ultrasound used (permanent image in chart),,    Narrative:  Start time: 12/27/2023 11:23 AM End time: 12/27/2023 11:39 AM Injection made incrementally with aspirations every 5 mL.  Performed by: Personally  Anesthesiologist: Ola Donny BROCKS, MD  Additional Notes: Patient consented for risk and benefits of nerve block including but not limited to nerve damage, failed block, bleeding and infection.  Patient voiced understanding.  Functioning IV was confirmed and monitors were applied.  Timeout done prior to procedure and prior to any sedation being given to the patient.  Patient confirmed procedure site prior to any sedation given to the patient.  A 50mm 22ga Stimuplex needle was used. Sterile prep,hand hygiene and sterile gloves were used.  Minimal sedation used for procedure.  No paresthesia endorsed by patient during the procedure.  Negative aspiration and negative test dose prior to incremental administration of local anesthetic. The patient tolerated the procedure well with no immediate complications.

## 2023-12-27 NOTE — Anesthesia Procedure Notes (Signed)
 Procedure Name: LMA Insertion Date/Time: 12/27/2023 12:17 PM  Performed by: Egan Sahlin, CRNAPre-anesthesia Checklist: Patient identified, Emergency Drugs available, Suction available, Patient being monitored and Timeout performed Patient Re-evaluated:Patient Re-evaluated prior to induction Oxygen Delivery Method: Circle system utilized Preoxygenation: Pre-oxygenation with 100% oxygen Induction Type: IV induction LMA: LMA inserted LMA Size: 4.0 Number of attempts: 1 Placement Confirmation: positive ETCO2, CO2 detector and breath sounds checked- equal and bilateral Tube secured with: Tape Dental Injury: Teeth and Oropharynx as per pre-operative assessment

## 2023-12-27 NOTE — Anesthesia Procedure Notes (Signed)
 Anesthesia Regional Block: Adductor canal block   Pre-Anesthetic Checklist: , timeout performed,  Correct Patient, Correct Site, Correct Laterality,  Correct Procedure, Correct Position, site marked,  Risks and benefits discussed,  Surgical consent,  Pre-op evaluation,  At surgeon's request and post-op pain management  Laterality: Lower and Right  Prep: chloraprep       Needles:  Injection technique: Single-shot  Needle Type: Echogenic Needle     Needle Length: 9cm  Needle Gauge: 21     Additional Needles:   Procedures:,,,, ultrasound used (permanent image in chart),,    Narrative:  Start time: 12/27/2023 11:40 AM End time: 12/27/2023 11:53 AM Injection made incrementally with aspirations every 5 mL.  Performed by: Personally  Anesthesiologist: Ola Donny BROCKS, MD  Additional Notes: Patient consented for risk and benefits of nerve block including but not limited to nerve damage, failed block, bleeding and infection.  Patient voiced understanding.  Functioning IV was confirmed and monitors were applied.  Timeout done prior to procedure and prior to any sedation being given to the patient.  Patient confirmed procedure site prior to any sedation given to the patient.  A 50mm 22ga Stimuplex needle was used. Sterile prep,hand hygiene and sterile gloves were used.  Minimal sedation used for procedure.  No paresthesia endorsed by patient during the procedure.  Negative aspiration and negative test dose prior to incremental administration of local anesthetic. The patient tolerated the procedure well with no immediate complications.

## 2023-12-27 NOTE — Op Note (Signed)
 PODIATRY / FOOT AND ANKLE SURGERY OPERATIVE REPORT    SURGEON: Prentice Lee, DPM  PRE-OPERATIVE DIAGNOSIS:  1.  Hallux valgus contracture right foot 2.  Right second hammertoe with slight contracture at the second metatarsal phalangeal joint with dorsal deviation of digit and slight medial deviation  POST-OPERATIVE DIAGNOSIS: Same  PROCEDURE(S): Right Lapidus bunionectomy Right second PIPJ toe arthrodesis with second metatarsal phalangeal joint capsular tendon balancing  HEMOSTASIS: Right ankle tourniquet  ANESTHESIA: general  ESTIMATED BLOOD LOSS: 30 cc  FINDING(S): 1.  Hallux valgus right side 2.  PIPJ exostosis medially second toe with slight contracture of digit  PATHOLOGY/SPECIMEN(S): None taken  INDICATIONS:   Anne Swanson is a 63 y.o. female who presents with a painful bunion deformity to the right foot as well as painful second toe at the PIPJ in which the big toe hits into the second toe and causes irritation.  Patient has been treated conservatively but still has pain discomfort.  All treatment options were discussed with the patient both conservative and surgical attempts at correction: Potential risks and complications at this time patient is elected for surgical procedure today consisting of procedure described above and below.  Patient once consented for also possible second metatarsal Weil osteotomy and flexor tendon transfer as well as second hammertoe repair, Lapidus bunionectomy with possible Aiken osteotomy.  No guarantees given.  Consent obtained prior to procedure..  DESCRIPTION: After obtaining full informed written consent, the patient was brought back to the operating room and placed supine upon the operating table.  The patient received IV antibiotics prior to induction.  After obtaining adequate anesthesia, the patient was prepped and draped in the standard fashion.  An Esmarch bandage and used to exsanguinate the right lower extremity pneumatic ankle  tourniquet was inflated.  Attention was then directed to the first tarsometatarsal joint dorsally where a linear longitudinal incision was made slightly medial to the extensor hallucis longus tendon.  The incision was deepened through the subcutaneous tissues utilizing sharp and blunt dissection and care was taken to identify and retract all vital neural and vascular structures no venous contributories were cauterized necessary.  At this time a capsular and periosteal incision was made medial to the tendon of the EHL.  The capsular and periosteal tissue was reflected medially and laterally thereby exposing the first tarsometatarsal joint at the operative site.  Attention was then directed to the medial dorsal aspect of the first metatarsal phalangeal joint where a linear longitudinal incision was made following the contour of the deformity.  The incision was deepened through the subcutaneous tissues utilizing sharp and blunt dissection and care was taken to identify and retract all vital neural and vascular structures no venous contributories were cauterized necessary.  At this time a capsular and periosteal incision was made medial to the tendon of the EHL and involved the contour of the deformity.  The capsular periosteal tissue was reflected medially and laterally at the operative site thereby expose the first metatarsal phalange joint at the operative site.  A rush of fluid was seen from the first metatarsal phalangeal joint consistent with synovitis and there appeared to be some synovitic tissue within the capsular tissue which was resected and passed off the operative site.  The medial eminence was then resected and passed off the operative site.  Attention was then directed to the first interspace via the same incision where the extensor tendon was retracted medially and the skin and subcutaneous tissue was retracted laterally.  A lateral lease  was performed releasing the collateral and suspensory  ligaments as well as the adductor hallucis.  This appeared to free up the lateral capsule and sesamoids.  At this time attention was then directed to the first metatarsal base area at the first tarsometatarsal joint and a saw was then placed into the joint to separate the joint and from any capsular and ligamentous attachments.  This appeared to free up the joint well.  The rotational wire was then placed in the first metatarsal base and the fulcrum was then placed.  A small incision was made made over the dorsal aspect of the second metatarsal distal shaft.  This was bluntly dissected to the lateral aspect of the shaft.  At this time the reduction guide was then placed and the reduction wire was then used to rotate the first metatarsal and the appropriate orientation correcting the rotational component of the deformity and the compressor was then applied to reduce the first intermetatarsal space angle.  This was then held with temporary fixation with a crossing wire.  This was then checked under fluoroscopic guidance and appeared to have good correction overall.  At this time the joint cut guide was then placed with the appropriate orientation and temporary fixated into place.  The cuts were then made into the medial cuneiform and first metatarsal base with the appropriate orientation.  The pin was then removed and 2 pins were left intact and the cut guide was then removed.  The distractor was then applied after the compressor that was applied previous was removed with the temporary fixation wire.  This was used to distract the joint and the joint surface was then cleaned removing the joint fragments that were created with the resection.  Any remaining cartilaginous tissue was resected with a curette.  The flush was performed with copious amounts normal sterile saline.  Fenestration was then performed to the joint surfaces.  At this time the compressor was then applied and the first tarsometatarsal joint was  compressed while holding the foot in a rectus position.  C-arm imaging was utilized to verify correct position which appeared to be excellent as the first intermetatarsal space angle appeared to be reduced as well as the sesamoids and hallux abductus angle.  The toe appeared to be in a rectus position overall.  Temporary fixation was then obtained across the first tarsometatarsal joint with a threaded olive wire.  The medial plate was then placed in the screws then placed the appropriate AO principles and techniques with 4 locking screws.  The compressor was then removed but the temporary wire for temporary fixation was left intact as another point of fixation.  The dorsal plate was then applied utilizing standard AO principles and techniques with 4 locking screws to complete the fixation.  The temporary threaded wire was then removed.  C-arm imaging was utilized to verify correct position which appeared to be excellent.  There appeared to be some slight deviation still of the big toe in a lateral position so at this time attention was directed to the first metatarsal phalange joint where a medial capsulotomy was performed removing a small section of the capsule.  The capsule was then tightened with 3-0 Vicryl reducing the drift of the big toe in a lateral direction.  Both surgical sites were flushed with copious amounts normal sterile saline.  The periosteal capsular structures were reapproximated well coapted with 3-0 Vicryl.  The subcutaneous tissue was reapproximated well coapted with 4-0 Vicryl.  The skin was then  reapproximated well coapted with 4-0 Monocryl in a running subcuticular stitch.  Attention was then directed to the second toe where an incision was made over the second metatarsal phalange joint extending to the PIPJ.  The incision was extended from the area where the compressor was applied a reduction guide was applied from the [place] medical equipment.  Incision was deepened to the subcutaneous  tissues utilizing sharp blunt dissection and care was taken to identify and retract all vital neurovascular structures no venous contributories were cauterized necessary.  At this time a PIPJ incision was then made through the extensor tendon and capsule and the collateral ligaments were released.  The extensor tendon was reflected proximally thereby exposing the head of the proximal phalanx and base of the middle phalanx at the operative site.  The head of the proximal phalanx was resected and passed off the operative site.  The base of the middle phalanx was also resected with a curette removing all cartilage to the area.  There appeared to be still an exostosis present to the medial base of the middle phalanx so this was resected with a combination of rongeur and paddle rasp.  At this time a retrograde wire was then placed through the middle phalanx and then through the proximal phalanx.  The wire was then checked under fluoroscopic guidance and appeared to be in good placement overall.  The toe was then held in a rectus position.  The pin was then bent and cut and a pin cap was placed.  The toe appeared to be slightly elevated still at segment tarsal phalange joint appeared to be having a very taut extensor tendon present.  This appeared to be most of the deformity present so at this time a Z-lengthening was then performed of the extensor tendon while holding the toe in a slightly plantarflexed position the tendon was reapproximated with a running interconnected 3-0 Vicryl elongating the tendon.  The tendon appeared to be intact and the toe appeared to sit in rectus position at this time.  There appeared to be some slight medial deviation of the toe at the second metatarsal phalange joint level so a medial capsulotomy was performed releasing the collateral and suspensory ligaments to this area.  The lateral collateral ligaments were then reinforced with 3-0 Vicryl.  This appeared to put the toe in a rectus  position overall.  With simulated weightbearing the second toe appeared to have contact with the ground compared to preop where the toe was elevated.  At this time it was determined not to perform a Weil osteotomy or flexor tendon transfer as the toe appeared to be in rectus position.  The surgical site was flushed with copious amounts normal sterile saline.  The parabola was position appeared to be intact and the second metatarsal appeared to be only very slightly elongated and appeared to be within normal limits overall.  The surgical site was flushed with copious amounts normal sterile saline.  The capsular tissues were reapproximated well coapted at the second metatarsal phalangeal joint with 3-0 Vicryl and the extensor tendon at the PIPJ was reapproximated well coapted with 4-0 Vicryl.  Subcutaneous tissues reapproximated well coapted with 4-0 Vicryl and the skin was then reapproximated well coapted with a running subcuticular 4-0 Monocryl.  The pneumatic ankle tourniquet was deflated and prompt hyperemic response was noted to all digits of the right foot.  A postoperative dressing is applied consisting of benzoin and Steri-Strips, 4 x 4 gauze, Kling, Kerlix, Webril, posterior splint,  Ace wrap.  The patient tolerated the procedure and anesthesia well and was transferred to the cover room with vital signs stable and vascular status intact all toes of the right foot.  Following a period of postoperative monitoring the patient be discharged home with the appropriate orders, instructions, medications.  Patient is to follow-up in outpatient clinic in 1 week for further evaluation.  Discussed with family afterwards that segment of tarsal Weil osteotomy and flexor tendon transfer was not performed as toe appeared to be sitting in rectus position.  If for some reason the toe starts to elevate or patient ends up with worsening pain to the second metatarsal phalange joint could consider revision in the  future.  COMPLICATIONS: None  CONDITION: Good, stable  Prentice Lee, DPM

## 2023-12-27 NOTE — Transfer of Care (Signed)
 Immediate Anesthesia Transfer of Care Note  Patient: Anne Swanson  Procedure(s) Performed: BUNIONECTOMY, LAPIDUS (Right) CORRECTION, HAMMER TOE (Right: Second Toe)  Patient Location: PACU  Anesthesia Type: General  Level of Consciousness: awake, alert  and patient cooperative  Airway and Oxygen Therapy: Patient Spontanous Breathing and Patient connected to supplemental oxygen  Post-op Assessment: Post-op Vital signs reviewed, Patient's Cardiovascular Status Stable, Respiratory Function Stable, Patent Airway and No signs of Nausea or vomiting  Post-op Vital Signs: Reviewed and stable  Complications: No notable events documented.

## 2023-12-28 ENCOUNTER — Encounter: Payer: Self-pay | Admitting: Podiatry

## 2024-01-23 DIAGNOSIS — M2011 Hallux valgus (acquired), right foot: Secondary | ICD-10-CM | POA: Diagnosis not present

## 2024-01-23 DIAGNOSIS — M79671 Pain in right foot: Secondary | ICD-10-CM | POA: Diagnosis not present

## 2024-01-25 ENCOUNTER — Ambulatory Visit: Admitting: Physician Assistant

## 2024-01-25 ENCOUNTER — Ambulatory Visit: Payer: Self-pay

## 2024-01-25 VITALS — BP 109/70 | HR 99 | Resp 14 | Ht 62.0 in | Wt 116.0 lb

## 2024-01-25 DIAGNOSIS — Z9889 Other specified postprocedural states: Secondary | ICD-10-CM | POA: Diagnosis not present

## 2024-01-25 DIAGNOSIS — R079 Chest pain, unspecified: Secondary | ICD-10-CM | POA: Diagnosis not present

## 2024-01-25 DIAGNOSIS — R12 Heartburn: Secondary | ICD-10-CM

## 2024-01-25 DIAGNOSIS — K5909 Other constipation: Secondary | ICD-10-CM

## 2024-01-25 NOTE — Telephone Encounter (Signed)
 Spoke with pt, was advised to proceed to ER. Pt laughed during call stating it is not a heart attack as her symptoms only happen in the stomach area only after eating. I advised I only call to advise provider recommendation if pt wanted to keep appt she could.

## 2024-01-25 NOTE — Telephone Encounter (Signed)
 FYI Only or Action Required?: FYI only for provider.  Patient was last seen in primary care on 11/12/2023 by Gasper Nancyann BRAVO, MD.  Called Nurse Triage reporting Chest Pain.  Symptoms began several days ago.  Interventions attempted: Nothing.  Symptoms are: unchanged.  Triage Disposition: See Physician Within 24 Hours  Patient/caregiver understands and will follow disposition?: Yes  **Appt. Scheduled for 10/10**        Copied from CRM #[id number]. Topic: Clinical - Red Word Triage >> Jan 25, 2024  8:10 AM Donna BRAVO wrote: Red Word that prompted transfer to Nurse Triage: patient had foot surgery 4 weeks ago, on Monday 01/21/24 pain after eating, like a brick sitting at the base of her sternum, strong pain that 40 minutes or so, this does not happen when drinking liquid, This morning the she has a good ache/sore at the bottom of sternum that wraps around to the back on right side. Nausea off and on, lost 6 lbs since the surgery Reason for Disposition  [1] Chest pain lasts > 5 minutes AND [2] occurred > 3 days ago (72 hours) AND [3] NO chest pain or cardiac symptoms now  Answer Assessment - Initial Assessment Questions 1. LOCATION: Where does it hurt?       Base of sternum is sore   2. RADIATION: Does the pain go anywhere else? (e.g., into neck, jaw, arms, back)     Back right shoulder blade   3. ONSET: When did the chest pain begin? (Minutes, hours or days)      Last Monday with each meal   4. PATTERN: Does the pain come and go, or has it been constant since it started?  Does it get worse with exertion?       Intermittent only after she eats and last 40 minutes.   5. DURATION: How long does it last (e.g., seconds, minutes, hours)     last 40 minutes  6. SEVERITY: How bad is the pain?  (e.g., Scale 1-10; mild, moderate, or severe)      Mild currently, after she eats the pain increases to severe  7. CARDIAC RISK FACTORS: Do you have any history of heart  problems or risk factors for heart disease? (e.g., angina, prior heart attack; diabetes, high blood pressure, high cholesterol, smoker, or strong family history of heart disease)     high cholesterol, on medication   8. PULMONARY RISK FACTORS: Do you have any history of lung disease?  (e.g., blood clots in lung, asthma, emphysema, birth control pills)     Asthma, uses albuterol    9. CAUSE: What do you think is causing the chest pain?     Unknown   10. OTHER SYMPTOMS: Do you have any other symptoms? (e.g., dizziness, nausea, vomiting, sweating, fever, difficulty breathing, cough)  Nausea since foot surgery x 1 month ago. Appt. Scheduled for 10/10  Protocols used: Chest Pain-A-AH

## 2024-01-27 ENCOUNTER — Ambulatory Visit: Payer: Self-pay | Admitting: Physician Assistant

## 2024-01-27 DIAGNOSIS — A048 Other specified bacterial intestinal infections: Secondary | ICD-10-CM

## 2024-01-27 NOTE — Progress Notes (Signed)
 Established patient visit  Patient: Anne Swanson   DOB: [birth year]   63 y.o. Female  MRN: [id number] Visit Date: 01/25/2024  Today's healthcare provider: Jolynn Spencer, PA-C   Chief Complaint  Patient presents with   Chest Pain    patient had foot surgery 4 weeks ago, on Monday 01/21/24 pain after eating, like a brick sitting at the base of her sternum, strong pain that 40 minutes or so, this does not happen when drinking liquid, This morning the she has a good ache/sore at the bottom of sternum that wraps around to the back on right side. Nausea off and on, lost 6 lbs since the surgery   Subjective     HPI     Chest Pain    Additional comments: patient had foot surgery 4 weeks ago, on Monday 01/21/24 pain after eating, like a brick sitting at the base of her sternum, strong pain that 40 minutes or so, this does not happen when drinking liquid, This morning the she has a good ache/sore at the bottom of sternum that wraps around to the back on right side. Nausea off and on, lost 6 lbs since the surgery      Last edited by Wilfred Hargis RAMAN, CMA on 01/25/2024 10:50 AM.       Discussed the use of AI scribe software for clinical note transcription with the patient, who gave verbal consent to proceed.  History of Present Illness Anne Swanson is a 63 year old female who presents with postprandial chest pain following foot surgery.  She experiences chest pain approximately ten minutes after meals, described as a sensation of 'a brick' under the sternum, similar to passing a gallstone. The pain is not related to specific foods. She has a decreased appetite and consumes small portions. She takes NSAIDs daily, including ibuprofen, meloxicam, and Aleve, with food and water. She has not used acid-reducing medications like Pepcid , omeprazole, Protonix, or Nexium recently. Bowel movements occur every three to four days, and she uses Colace. There is no blood in urine or stool and no leg  swelling. She experiences occasional chest pain, shortness of breath, and rapid heartbeat. She has high cholesterol.       11/12/2023    3:51 PM 03/28/2023    9:47 AM 07/18/2022    2:59 PM  Depression screen PHQ 2/9  Decreased Interest 0 0 0  Down, Depressed, Hopeless 0 0 0  PHQ - 2 Score 0 0 0  Altered sleeping 2 1 0  Tired, decreased energy 0 0 0  Change in appetite 0 0 0  Feeling bad or failure about yourself  0  0  Trouble concentrating 0 0 2  Moving slowly or fidgety/restless 0 0 0  Suicidal thoughts 0 0 0  PHQ-9 Score 2 1 2   Difficult doing work/chores Not difficult at all Not difficult at all Not difficult at all      11/12/2023    3:51 PM 03/28/2023    9:47 AM  GAD 7 : Generalized Anxiety Score  Nervous, Anxious, on Edge 0 1  Control/stop worrying 0 1  Worry too much - different things 0 0  Trouble relaxing 0 0  Restless 0 0  Easily annoyed or irritable 0 1  Afraid - awful might happen 0 1  Total GAD 7 Score 0 4  Anxiety Difficulty  Not difficult at all    Medications: Outpatient Medications Prior to Visit  Medication Sig   albuterol  (VENTOLIN  HFA) 108 (  90 Base) MCG/ACT inhaler INHALE 2 PUFFS BY MOUTH EVERY 6 HOURS AS NEEDED FOR WHEEZING   aspirin  EC 81 MG tablet Take 1 tablet (81 mg total) by mouth 2 (two) times daily. Swallow whole.   atorvastatin  (LIPITOR) 10 MG tablet Take 1 tablet (10 mg total) by mouth daily.   cholecalciferol (VITAMIN D3) 25 MCG (1000 UNIT) tablet Take 1,000 Units by mouth daily.   meclizine  (ANTIVERT ) 25 MG tablet Take 1 tablet (25 mg total) by mouth 3 (three) times daily as needed for dizziness.   ondansetron  (ZOFRAN ) 4 MG tablet Take 1 tablet (4 mg total) by mouth every 8 (eight) hours as needed for nausea or vomiting.   [DISCONTINUED] cephALEXin  (KEFLEX ) 500 MG capsule Take 500 mg by mouth 2 (two) times daily.   [DISCONTINUED] estradiol  (ESTRACE ) 0.1 MG/GM vaginal cream Estrogen Cream Instruction Discard applicator Apply pea sized  amount to tip of finger to urethra before bed. Wash hands well after application. Use Monday, Wednesday and Friday (Patient not taking: Reported on 11/12/2023)   [DISCONTINUED] nitrofurantoin, macrocrystal-monohydrate, (MACROBID) 100 MG capsule Take 100 mg by mouth 2 (two) times daily.   No facility-administered medications prior to visit.    Review of Systems All negative Except see HPI   {Insert previous labs (optional):23779} {See past labs  Heme  Chem  Endocrine  Serology  Results Review (optional):1}   Objective    BP 109/70   Pulse 99   Resp 14   Ht 5' 2 (1.575 m)   Wt 116 lb (52.6 kg)   LMP 02/15/2014   SpO2 99%   BMI 21.22 kg/m  {Insert last BP/Wt (optional):23777}{See vitals history (optional):1}   Physical Exam Vitals reviewed.  Constitutional:      General: She is not in acute distress.    Appearance: Normal appearance. She is well-developed. She is not diaphoretic.  HENT:     Head: Normocephalic and atraumatic.  Eyes:     General: No scleral icterus.    Conjunctiva/sclera: Conjunctivae normal.  Neck:     Thyroid: No thyromegaly.  Cardiovascular:     Rate and Rhythm: Normal rate and regular rhythm.     Pulses: Normal pulses.     Heart sounds: Normal heart sounds. No murmur heard. Pulmonary:     Effort: Pulmonary effort is normal. No respiratory distress.     Breath sounds: Normal breath sounds. No wheezing, rhonchi or rales.  Musculoskeletal:     Cervical back: Neck supple.     Right lower leg: No edema.     Left lower leg: No edema.  Lymphadenopathy:     Cervical: No cervical adenopathy.  Skin:    General: Skin is warm and dry.     Findings: No rash.  Neurological:     Mental Status: She is alert and oriented to person, place, and time. Mental status is at baseline.  Psychiatric:        Mood and Affect: Mood normal.        Behavior: Behavior normal.      Results for orders placed or performed in visit on 01/25/24  H. pylori breath test   Result Value Ref Range   H pylori Breath Test WILL FOLLOW   Comprehensive metabolic panel with GFR  Result Value Ref Range   Glucose 97 70 - 99 mg/dL   BUN 8 8 - 27 mg/dL   Creatinine, Ser 9.23 0.57 - 1.00 mg/dL   eGFR 88 >40 fO/fpw/8.26   BUN/Creatinine Ratio 11 (L)  12 - 28   Sodium 135 134 - 144 mmol/L   Potassium 4.4 3.5 - 5.2 mmol/L   Chloride 98 96 - 106 mmol/L   CO2 22 20 - 29 mmol/L   Calcium  9.7 8.7 - 10.3 mg/dL   Total Protein 6.9 6.0 - 8.5 g/dL   Albumin 4.5 3.9 - 4.9 g/dL   Globulin, Total 2.4 1.5 - 4.5 g/dL   Bilirubin Total 0.8 0.0 - 1.2 mg/dL   Alkaline Phosphatase 210 (H) 49 - 135 IU/L   AST 229 (H) 0 - 40 IU/L   ALT 269 (H) 0 - 32 IU/L  CBC with Differential/Platelet  Result Value Ref Range   WBC 4.8 3.4 - 10.8 x10E3/uL   RBC 4.60 3.77 - 5.28 x10E6/uL   Hemoglobin 14.1 11.1 - 15.9 g/dL   Hematocrit 58.0 65.9 - 46.6 %   MCV 91 79 - 97 fL   MCH 30.7 26.6 - 33.0 pg   MCHC 33.7 31.5 - 35.7 g/dL   RDW 86.9 88.2 - 84.5 %   Platelets 200 150 - 450 x10E3/uL   Neutrophils 61 Not Estab. %   Lymphs 26 Not Estab. %   Monocytes 8 Not Estab. %   Eos 4 Not Estab. %   Basos 1 Not Estab. %   Neutrophils Absolute 3.0 1.4 - 7.0 x10E3/uL   Lymphocytes Absolute 1.2 0.7 - 3.1 x10E3/uL   Monocytes Absolute 0.4 0.1 - 0.9 x10E3/uL   EOS (ABSOLUTE) 0.2 0.0 - 0.4 x10E3/uL   Basophils Absolute 0.0 0.0 - 0.2 x10E3/uL   Immature Granulocytes 0 Not Estab. %   Immature Grans (Abs) 0.0 0.0 - 0.1 x10E3/uL  TSH  Result Value Ref Range   TSH 1.130 0.450 - 4.500 uIU/mL  Lipid panel  Result Value Ref Range   Cholesterol, Total 160 100 - 199 mg/dL   Triglycerides 78 0 - 149 mg/dL   HDL 52 >60 mg/dL   VLDL Cholesterol Cal 15 5 - 40 mg/dL   LDL Chol Calc (NIH) 93 0 - 99 mg/dL   Chol/HDL Ratio 3.1 0.0 - 4.4 ratio  Hemoglobin A1c  Result Value Ref Range   Hgb A1c MFr Bld 5.7 (H) 4.8 - 5.6 %   Est. average glucose Bld gHb Est-mCnc 117 mg/dL  Lipase  Result Value Ref Range   Lipase  22 14 - 72 U/L  Amylase  Result Value Ref Range   Amylase 20 (L) 31 - 110 U/L        Assessment & Plan Epigastric pain after meals (suspected gastroesophageal reflux) Epigastric pain postprandially suggests GERD or gastritis. No gallbladder issues or recent PPI/H2 blocker use. Possible NSAID-related exacerbation. - Order lab tests: H. pylori, liver enzymes, kidney function, electrolytes. - Prescribe OTC Pepcid  (famotidine ) immediately. - If Pepcid  ineffective, prescribe Protonix (pantoprazole). - Advise dietary modifications: avoid caffeine, eat small portions, consume more liquid and light foods. - Schedule follow-up with primary care physician in two weeks.  Chest pain Intermittent chest pain associated with meals, likely related to GERD. - Order EKG to assess cardiac function. Showed nsr, t wave abnormalities - Consider troponin and D-dimer tests if EKG indicates cardiac issues.  Constipation Constipation post-surgery, using Colace.  General Health Maintenance Discussed cholesterol management and caffeine intake. No recent dermatology visits for pigmented spots. - Order cholesterol test. - Recommend dermatology consultation for pigmented spots evaluation.  Follow-Up Plans for ongoing symptom management and treatment reassessment. - Schedule follow-up with primary care physician in two weeks. - If  unavailable, schedule follow-up with current provider.  Chest pain, unspecified type (Primary)  - EKG 12-Lead - H. pylori breath test - Comprehensive metabolic panel with GFR - CBC with Differential/Platelet - TSH - Lipid panel - Hemoglobin A1c - Lipase - Amylase  Heartburn  - H. pylori breath test - Lipase - Amylase   Orders Placed This Encounter  Procedures   H. pylori breath test   Comprehensive metabolic panel with GFR    Has the patient fasted?:   Yes   CBC with Differential/Platelet   TSH   Lipid panel    Has the patient fasted?:   Yes   Hemoglobin A1c    Lipase   Amylase   EKG 12-Lead    No follow-ups on file.   The patient was advised to call back or seek an in-person evaluation if the symptoms worsen or if the condition fails to improve as anticipated.  I discussed the assessment and treatment plan with the patient. The patient was provided an opportunity to ask questions and all were answered. The patient agreed with the plan and demonstrated an understanding of the instructions.  I, Srah Ake, PA-C have reviewed all documentation for this visit. The documentation on 01/25/2024  for the exam, diagnosis, procedures, and orders are all accurate and complete.  Anne Swanson, [facility], MMS [facility] [phone] (phone) [phone] (fax)  [facility]

## 2024-01-28 ENCOUNTER — Ambulatory Visit: Admitting: Family Medicine

## 2024-01-28 ENCOUNTER — Ambulatory Visit: Payer: Self-pay

## 2024-01-28 ENCOUNTER — Other Ambulatory Visit: Payer: Self-pay | Admitting: Family Medicine

## 2024-01-28 ENCOUNTER — Encounter: Payer: Self-pay | Admitting: Family Medicine

## 2024-01-28 ENCOUNTER — Telehealth: Payer: Self-pay

## 2024-01-28 ENCOUNTER — Other Ambulatory Visit: Payer: Self-pay | Admitting: Physician Assistant

## 2024-01-28 VITALS — BP 116/61 | HR 63 | Temp 97.9°F | Ht 62.0 in | Wt 113.9 lb

## 2024-01-28 DIAGNOSIS — R1013 Epigastric pain: Secondary | ICD-10-CM

## 2024-01-28 DIAGNOSIS — R110 Nausea: Secondary | ICD-10-CM

## 2024-01-28 DIAGNOSIS — K5909 Other constipation: Secondary | ICD-10-CM | POA: Insufficient documentation

## 2024-01-28 DIAGNOSIS — A048 Other specified bacterial intestinal infections: Secondary | ICD-10-CM

## 2024-01-28 DIAGNOSIS — R748 Abnormal levels of other serum enzymes: Secondary | ICD-10-CM

## 2024-01-28 DIAGNOSIS — R12 Heartburn: Secondary | ICD-10-CM | POA: Insufficient documentation

## 2024-01-28 LAB — CBC WITH DIFFERENTIAL/PLATELET
Basophils Absolute: 0 x10E3/uL (ref 0.0–0.2)
Basos: 1 %
EOS (ABSOLUTE): 0.2 x10E3/uL (ref 0.0–0.4)
Eos: 4 %
Hematocrit: 41.9 % (ref 34.0–46.6)
Hemoglobin: 14.1 g/dL (ref 11.1–15.9)
Immature Grans (Abs): 0 x10E3/uL (ref 0.0–0.1)
Immature Granulocytes: 0 %
Lymphocytes Absolute: 1.2 x10E3/uL (ref 0.7–3.1)
Lymphs: 26 %
MCH: 30.7 pg (ref 26.6–33.0)
MCHC: 33.7 g/dL (ref 31.5–35.7)
MCV: 91 fL (ref 79–97)
Monocytes Absolute: 0.4 x10E3/uL (ref 0.1–0.9)
Monocytes: 8 %
Neutrophils Absolute: 3 x10E3/uL (ref 1.4–7.0)
Neutrophils: 61 %
Platelets: 200 x10E3/uL (ref 150–450)
RBC: 4.6 x10E6/uL (ref 3.77–5.28)
RDW: 13 % (ref 11.7–15.4)
WBC: 4.8 x10E3/uL (ref 3.4–10.8)

## 2024-01-28 LAB — LIPID PANEL
Chol/HDL Ratio: 3.1 ratio (ref 0.0–4.4)
Cholesterol, Total: 160 mg/dL (ref 100–199)
HDL: 52 mg/dL (ref >39)
LDL Chol Calc (NIH): 93 mg/dL (ref 0–99)
Triglycerides: 78 mg/dL (ref 0–149)
VLDL Cholesterol Cal: 15 mg/dL (ref 5–40)

## 2024-01-28 LAB — COMPREHENSIVE METABOLIC PANEL WITH GFR
ALT: 269 IU/L — ABNORMAL HIGH (ref 0–32)
AST: 229 IU/L — ABNORMAL HIGH (ref 0–40)
Albumin: 4.5 g/dL (ref 3.9–4.9)
Alkaline Phosphatase: 210 IU/L — ABNORMAL HIGH (ref 49–135)
BUN/Creatinine Ratio: 11 — ABNORMAL LOW (ref 12–28)
BUN: 8 mg/dL (ref 8–27)
Bilirubin Total: 0.8 mg/dL (ref 0.0–1.2)
CO2: 22 mmol/L (ref 20–29)
Calcium: 9.7 mg/dL (ref 8.7–10.3)
Chloride: 98 mmol/L (ref 96–106)
Creatinine, Ser: 0.76 mg/dL (ref 0.57–1.00)
Globulin, Total: 2.4 g/dL (ref 1.5–4.5)
Glucose: 97 mg/dL (ref 70–99)
Potassium: 4.4 mmol/L (ref 3.5–5.2)
Sodium: 135 mmol/L (ref 134–144)
Total Protein: 6.9 g/dL (ref 6.0–8.5)
eGFR: 88 mL/min/1.73 (ref >59)

## 2024-01-28 LAB — HEMOGLOBIN A1C
Est. average glucose Bld gHb Est-mCnc: 117 mg/dL
Hgb A1c MFr Bld: 5.7 % — ABNORMAL HIGH (ref 4.8–5.6)

## 2024-01-28 LAB — LIPASE: Lipase: 22 U/L (ref 14–72)

## 2024-01-28 LAB — AMYLASE: Amylase: 20 U/L — ABNORMAL LOW (ref 31–110)

## 2024-01-28 LAB — H. PYLORI BREATH TEST: H pylori Breath Test: POSITIVE — AB

## 2024-01-28 LAB — TSH: TSH: 1.13 u[IU]/mL (ref 0.450–4.500)

## 2024-01-28 MED ORDER — ONDANSETRON 4 MG PO TBDP: 4.0000 mg | ORAL_TABLET | Freq: Three times a day (TID) | ORAL | 0 refills | Status: AC | PRN

## 2024-01-28 MED ORDER — AMOXICILLIN 500 MG PO TABS: 1000.0000 mg | ORAL_TABLET | Freq: Two times a day (BID) | ORAL | 0 refills | Status: AC

## 2024-01-28 MED ORDER — PANTOPRAZOLE SODIUM 40 MG PO TBEC: 40.0000 mg | DELAYED_RELEASE_TABLET | Freq: Two times a day (BID) | ORAL | 1 refills | Status: DC

## 2024-01-28 MED ORDER — METRONIDAZOLE 500 MG PO TABS: 500.0000 mg | ORAL_TABLET | Freq: Two times a day (BID) | ORAL | 0 refills | Status: DC

## 2024-01-28 MED ORDER — CLARITHROMYCIN 500 MG PO TABS: 500.0000 mg | ORAL_TABLET | Freq: Two times a day (BID) | ORAL | 0 refills | Status: DC

## 2024-01-28 NOTE — Patient Instructions (Signed)
 To keep you healthy, please keep in mind the following health maintenance items that you are due for:   Health Maintenance Due  Topic Date Due   Zoster Vaccines- Shingrix (1 of 2) Never done   Pneumococcal Vaccine: 50+ Years (2 of 2 - PCV) 01/04/2021   Influenza Vaccine  11/16/2023     Best Wishes,   Dr. Lang

## 2024-01-28 NOTE — Progress Notes (Signed)
 ACUTE VISIT   Patient: Anne Swanson   DOB: [birth year]   63 y.o. Female  MRN: [id number]   PCP: Myrla Jon HERO, MD  Chief Complaint  Patient presents with   Abdominal Pain    Bilateral upper abd pain, patient reports this feels similar to when she passed a gallstone. States it started about 1 week ago, has pain after every meal. Patient no longer has her gallbladder, reports weight loss of around 7 pounds    Subjective    Abdominal Pain   HPI     Abdominal Pain    Additional comments: Bilateral upper abd pain, patient reports this feels similar to when she passed a gallstone. States it started about 1 week ago, has pain after every meal. Patient no longer has her gallbladder, reports weight loss of around 7 pounds       Last edited by Cherry Chiquita HERO, CMA on 01/28/2024 11:12 AM.       Discussed the use of AI scribe software for clinical note transcription with the patient, who gave verbal consent to proceed.  History of Present Illness Anne Swanson is a 63 year old female who presents with bilateral upper abdominal pain after meals.  She has been experiencing bilateral upper abdominal pain for one week, occurring after every meal and described as severe, lasting about forty minutes. The pain resembles her previous gallstone pain, despite having undergone cholecystectomy. The pain persists even with a bland diet consisting of toast, yogurt, banana, watermelon, and mashed potatoes.  She has experienced significant weight loss of approximately 70 pounds and finds it difficult to eat due to the intensity of the pain. She has not eaten since the previous afternoon but reports an unusual pressure building in her abdomen. No issues with swallowing, and she has not experienced vomiting, though she has been intermittently nauseous since her recent foot surgery.  She underwent an EKG recently, which was normal. Her liver enzymes were elevated. She had recently started  Lipitor after experiencing memory issues with Crestor . She has since stopped taking Lipitor. Her lipase levels were normal. She has not had any imaging studies such as an ultrasound or CT scan of her abdomen since the onset of her symptoms.  She reports frequent urination, which she attributes to increased water intake. She had a bladder infection at the beginning of September, for which she completed a course of antibiotics before her foot surgery. She was also on high doses of vitamin D  for eight weeks prior to the surgery due to low vitamin D  levels.  She underwent foot surgery on September 11th for a bunion and hammer toe and is still recovering, with a pin in place and limited mobility. She is not walking much and is concerned about her nutritional intake affecting her recovery. She is no longer taking opioids for pain management.     Medications: Outpatient Medications Prior to Visit  Medication Sig   albuterol  (VENTOLIN  HFA) 108 (90 Base) MCG/ACT inhaler INHALE 2 PUFFS BY MOUTH EVERY 6 HOURS AS NEEDED FOR WHEEZING   aspirin  EC 81 MG tablet Take 1 tablet (81 mg total) by mouth 2 (two) times daily. Swallow whole.   cholecalciferol (VITAMIN D3) 25 MCG (1000 UNIT) tablet Take 1,000 Units by mouth daily.   meclizine  (ANTIVERT ) 25 MG tablet Take 1 tablet (25 mg total) by mouth 3 (three) times daily as needed for dizziness.   [DISCONTINUED] ondansetron  (ZOFRAN ) 4 MG tablet Take 1 tablet (  4 mg total) by mouth every 8 (eight) hours as needed for nausea or vomiting.   atorvastatin  (LIPITOR) 10 MG tablet Take 1 tablet (10 mg total) by mouth daily. (Patient not taking: Reported on 01/28/2024)   No facility-administered medications prior to visit.    Last CBC Lab Results  Component Value Date   WBC 4.8 01/25/2024   HGB 14.1 01/25/2024   HCT 41.9 01/25/2024   MCV 91 01/25/2024   MCH 30.7 01/25/2024   RDW 13.0 01/25/2024   PLT 200 01/25/2024   Last metabolic panel Lab Results  Component  Value Date   GLUCOSE 97 01/25/2024   NA 135 01/25/2024   K 4.4 01/25/2024   CL 98 01/25/2024   CO2 22 01/25/2024   BUN 8 01/25/2024   CREATININE 0.76 01/25/2024   EGFR 88 01/25/2024   CALCIUM  9.7 01/25/2024   PROT 6.9 01/25/2024   ALBUMIN 4.5 01/25/2024   LABGLOB 2.4 01/25/2024   AGRATIO 2.1 03/08/2022   BILITOT 0.8 01/25/2024   ALKPHOS 210 (H) 01/25/2024   AST 229 (H) 01/25/2024   ALT 269 (H) 01/25/2024   ANIONGAP 10 03/22/2020   Last hemoglobin A1c Lab Results  Component Value Date   HGBA1C 5.7 (H) 01/25/2024   Last thyroid functions Lab Results  Component Value Date   TSH 1.130 01/25/2024   T4TOTAL 7.6 11/16/2014        Objective    BP 116/61 (BP Location: Right Arm, Patient Position: Sitting, Cuff Size: Normal)   Pulse 63   Temp 97.9 F (36.6 C) (Oral)   Ht 5' 2 (1.575 m)   Wt 113 lb 14.4 oz (51.7 kg)   LMP 02/15/2014   SpO2 100%   BMI 20.83 kg/m   BP Readings from Last 3 Encounters:  01/28/24 116/61  01/25/24 109/70  12/27/23 132/64   Wt Readings from Last 3 Encounters:  01/28/24 113 lb 14.4 oz (51.7 kg)  01/25/24 116 lb (52.6 kg)  12/27/23 123 lb (55.8 kg)      Physical Exam Abdominal:     General: There is no distension.     Palpations: Abdomen is soft.     Tenderness: There is no abdominal tenderness. There is no right CVA tenderness or left CVA tenderness.      Physical Exam    No results found for any visits on 01/28/24.  Assessment & Plan      Assessment & Plan Epigastric pain  Acutely worsened  with postprandial exacerbation, nausea, and recent significant unintentional weight loss 63 year old female with acute onset of severe epigastric pain postprandially, starting one week ago. Pain is located at the sternum and radiates across the upper abdomen. Symptoms are similar to previous gallstone episodes, despite cholecystectomy. Reports significant weight loss of 70 pounds. Differential diagnosis includes peptic ulcer disease,  gastritis, or hiatal hernia. Liver enzymes are elevated, possibly due to recent Lipitor use, which has been discontinued. Lipase is normal, ruling out pancreatitis. H. pylori test pending. No imaging studies have been performed since symptom onset. - Order right upper quadrant ultrasound to assess liver and bile duct for stones or inflammation. - Refer to gastroenterology for evaluation and potential endoscopy to assess for ulcers or gastritis. - Prescribe Protonix 40 mg to be taken 30 minutes before breakfast and lunch to reduce gastric acid production. - Prescribe Zofran  4 mg disintegrating tablets for nausea as needed. - Advise to maintain a bland diet with small, frequent meals to minimize symptoms. - Encourage hydration and consider  protein drinks to support nutritional needs. - Follow up with GI and imaging results.      Return if symptoms worsen or fail to improve, for epigastric pain .        Rockie Agent, MD  [facility] [phone] (phone) [phone] (fax)  [facility]

## 2024-01-28 NOTE — Telephone Encounter (Signed)
 In my effort to decrease confusion: The pharmacy is requesting 90 day supply of Pantoprazole.   I am not sending that to Dr r even though she prescribed it, and not sending to Dr B even though she is PCP but Janna if you would prescribed appropriate medication for the H Pylori since you ordered it.  Thank you all

## 2024-01-28 NOTE — Telephone Encounter (Signed)
 FYI Only or Action Required?: Action required by provider: request for appointment.  Patient was last seen in primary care on 01/25/2024 by Ostwalt, Janna, PA-C.  Called Nurse Triage reporting Abdominal Pain.  Symptoms began a week ago.  Interventions attempted: Nothing.  Symptoms are: gradually worsening. Feels like gallstones, better when she doesn't eat.  Triage Disposition: See HCP Within 4 Hours (Or PCP Triage)  Patient/caregiver understands and will follow disposition?: Yes      Copied from CRM #[id number]. Topic: Clinical - Red Word Triage >> Jan 28, 2024  8:12 AM Amy B wrote: Red Word that prompted transfer to Nurse Triage: pain at top of stomach, not eating, nausea Answer Assessment - Initial Assessment Questions 1. LOCATION: Where does it hurt?      Upper middle, across top 2. RADIATION: Does the pain shoot anywhere else? (e.g., chest, back)     no 3. ONSET: When did the pain begin? (e.g., minutes, hours or days ago)      Monday 4. SUDDEN: Gradual or sudden onset?     sudden 5. PATTERN Does the pain come and go, or is it constant?     constant 6. SEVERITY: How bad is the pain?  (e.g., Scale 1-10; mild, moderate, or severe)     severe 7. RECURRENT SYMPTOM: Have you ever had this type of stomach pain before? If Yes, ask: When was the last time? and What happened that time?      yes 8. CAUSE: What do you think is causing the stomach pain? (e.g., gallstones, recent abdominal surgery)     gallstones 9. RELIEVING/AGGRAVATING FACTORS: What makes it better or worse? (e.g., antacids, bending or twisting motion, bowel movement)     no 10. OTHER SYMPTOMS: Do you have any other symptoms? (e.g., back pain, diarrhea, fever, urination pain, vomiting)       no 11. PREGNANCY: Is there any chance you are pregnant? When was your last menstrual period?       no  Protocols used: Abdominal Pain - Female-A-AH  Reason for Disposition  [1] MILD-MODERATE  pain AND [2] constant AND [3] present > 2 hours  Answer Assessment - Initial Assessment Questions 1. LOCATION: Where does it hurt?      Upper middle, across top 2. RADIATION: Does the pain shoot anywhere else? (e.g., chest, back)     no 3. ONSET: When did the pain begin? (e.g., minutes, hours or days ago)      Monday 4. SUDDEN: Gradual or sudden onset?     sudden 5. PATTERN Does the pain come and go, or is it constant?     constant 6. SEVERITY: How bad is the pain?  (e.g., Scale 1-10; mild, moderate, or severe)     severe 7. RECURRENT SYMPTOM: Have you ever had this type of stomach pain before? If Yes, ask: When was the last time? and What happened that time?      yes 8. CAUSE: What do you think is causing the stomach pain? (e.g., gallstones, recent abdominal surgery)     gallstones 9. RELIEVING/AGGRAVATING FACTORS: What makes it better or worse? (e.g., antacids, bending or twisting motion, bowel movement)     no 10. OTHER SYMPTOMS: Do you have any other symptoms? (e.g., back pain, diarrhea, fever, urination pain, vomiting)       no 11. PREGNANCY: Is there any chance you are pregnant? When was your last menstrual period?       no  Protocols used: Abdominal Pain - [place] [facility]

## 2024-01-28 NOTE — Telephone Encounter (Signed)
 Patient call note and symptoms reviewed. Agree with scheduled appt. Will evaluate during OV

## 2024-01-28 NOTE — Telephone Encounter (Signed)
 Copied from CRM [id number]. Topic: Clinical - Prescription Issue >> Jan 28, 2024  2:45 PM Shanda MATSU wrote: Reason for CRM: Patient calling in regards to pharmacy sending over a medication update for med, pantoprazole (PROTONIX) 40 MG tablet, patient is req a status update this.

## 2024-01-29 ENCOUNTER — Ambulatory Visit
Admission: RE | Admit: 2024-01-29 | Discharge: 2024-01-29 | Disposition: A | Discharge status: Home / Self Care | Source: Ambulatory Visit | Attending: Family Medicine | Admitting: Family Medicine

## 2024-01-29 ENCOUNTER — Other Ambulatory Visit: Payer: Self-pay | Admitting: Physician Assistant

## 2024-01-29 DIAGNOSIS — R1013 Epigastric pain: Secondary | ICD-10-CM | POA: Insufficient documentation

## 2024-01-29 DIAGNOSIS — R1011 Right upper quadrant pain: Secondary | ICD-10-CM | POA: Diagnosis not present

## 2024-01-29 DIAGNOSIS — Z9049 Acquired absence of other specified parts of digestive tract: Secondary | ICD-10-CM | POA: Diagnosis not present

## 2024-01-29 DIAGNOSIS — A048 Other specified bacterial intestinal infections: Secondary | ICD-10-CM

## 2024-01-29 MED ORDER — PANTOPRAZOLE SODIUM 40 MG PO TBEC: 40.0000 mg | DELAYED_RELEASE_TABLET | Freq: Two times a day (BID) | ORAL | 0 refills | Status: DC

## 2024-02-04 ENCOUNTER — Encounter: Payer: Self-pay | Admitting: Physician Assistant

## 2024-02-06 DIAGNOSIS — M2011 Hallux valgus (acquired), right foot: Secondary | ICD-10-CM | POA: Diagnosis not present

## 2024-02-08 ENCOUNTER — Ambulatory Visit: Admitting: Physician Assistant

## 2024-02-08 ENCOUNTER — Encounter: Payer: Self-pay | Admitting: Physician Assistant

## 2024-02-08 VITALS — BP 110/54 | HR 85 | Temp 98.6°F | Ht 62.0 in | Wt 114.0 lb

## 2024-02-08 DIAGNOSIS — S91309A Unspecified open wound, unspecified foot, initial encounter: Secondary | ICD-10-CM

## 2024-02-08 DIAGNOSIS — R7303 Prediabetes: Secondary | ICD-10-CM | POA: Diagnosis not present

## 2024-02-08 DIAGNOSIS — R748 Abnormal levels of other serum enzymes: Secondary | ICD-10-CM | POA: Diagnosis not present

## 2024-02-08 DIAGNOSIS — R1013 Epigastric pain: Secondary | ICD-10-CM | POA: Diagnosis not present

## 2024-02-08 DIAGNOSIS — A048 Other specified bacterial intestinal infections: Secondary | ICD-10-CM

## 2024-02-08 NOTE — Progress Notes (Signed)
 Established patient visit  Patient: Anne Swanson   DOB: 30-Aug-1960   63 y.o. Female  MRN: [id number] Visit Date: 02/08/2024  Today's healthcare provider: Jolynn Spencer, PA-C   Chief Complaint  Patient presents with   Follow-up    Patient is here for a 2 week follow up regarding Hpylori and ulcer.  Declined all vaccines.   Subjective     HPI     Follow-up    Additional comments: Patient is here for a 2 week follow up regarding Hpylori and ulcer.  Declined all vaccines.      Last edited by Terrel Powell CROME, CMA on 02/08/2024  1:26 PM.       Discussed the use of AI scribe software for clinical note transcription with the patient, who gave verbal consent to proceed.  History of Present Illness Anne Swanson is a 63 year old female who presents for follow-up of H. pylori treatment.  She is on day five of her antibiotic regimen for H. pylori, with four days remaining. She reports a significant reduction in symptoms, describing the relief as 'like a miracle.' She experiences minor aches or soreness lower down, which she manages with an ice pack. She is concerned about taking too many antibiotics and is trying to increase her protein intake to aid healing.  Her foot incision has not healed, but there is no fever. She had a previous UTI before her surgery and questions the need for a urinalysis. Her liver numbers were previously elevated, leading to the discontinuation of Lipitor, but recent liver ultrasounds have been unremarkable. She is aware of her prediabetic status, with blood sugar levels typically ranging from 5.7 to 5.9. Her blood pressure was low at 80/60 during a recent visit, causing her to feel unwell. She attributes this to not eating enough and is trying to manage her diet better. No chest pain or shortness of breath.  She is trying to eat more protein, which is staying down without causing extra pain. She reports regular bowel movements post-surgery and is  drinking approximately 60 ounces of water daily.       02/08/2024    1:28 PM 01/28/2024   11:34 AM 11/12/2023    3:51 PM  Depression screen PHQ 2/9  Decreased Interest 0 0 0  Down, Depressed, Hopeless 0 0 0  PHQ - 2 Score 0 0 0  Altered sleeping 0 3 2  Tired, decreased energy 3 3 0  Change in appetite 1 3 0  Feeling bad or failure about yourself  0 0 0  Trouble concentrating 0 0 0  Moving slowly or fidgety/restless 0 0 0  Suicidal thoughts 0 0 0  PHQ-9 Score 4 9 2   Difficult doing work/chores Somewhat difficult Very difficult Not difficult at all      02/08/2024    1:28 PM 01/28/2024   11:34 AM 11/12/2023    3:51 PM 03/28/2023    9:47 AM  GAD 7 : Generalized Anxiety Score  Nervous, Anxious, on Edge 0 1 0 1  Control/stop worrying 0  0 1  Worry too much - different things 0  0 0  Trouble relaxing 0  0 0  Restless 0  0 0  Easily annoyed or irritable 1  0 1  Afraid - awful might happen 0  0 1  Total GAD 7 Score 1  0 4  Anxiety Difficulty Somewhat difficult Not difficult at all  Not difficult at all    Medications: Outpatient Medications  Prior to Visit  Medication Sig   albuterol  (VENTOLIN  HFA) 108 (90 Base) MCG/ACT inhaler INHALE 2 PUFFS BY MOUTH EVERY 6 HOURS AS NEEDED FOR WHEEZING   amoxicillin (AMOXIL) 500 MG tablet Take 2 tablets (1,000 mg total) by mouth 2 (two) times daily for 14 days.   cholecalciferol (VITAMIN D3) 25 MCG (1000 UNIT) tablet Take 1,000 Units by mouth daily.   clarithromycin (BIAXIN) 500 MG tablet Take 1 tablet (500 mg total) by mouth 2 (two) times daily.   meclizine  (ANTIVERT ) 25 MG tablet Take 1 tablet (25 mg total) by mouth 3 (three) times daily as needed for dizziness.   ondansetron  (ZOFRAN -ODT) 4 MG disintegrating tablet Take 1 tablet (4 mg total) by mouth every 8 (eight) hours as needed for nausea or vomiting.   pantoprazole (PROTONIX) 40 MG tablet Take 1 tablet (40 mg total) by mouth 2 (two) times daily.   [DISCONTINUED] aspirin  EC 81 MG  tablet Take 1 tablet (81 mg total) by mouth 2 (two) times daily. Swallow whole.   [DISCONTINUED] atorvastatin  (LIPITOR) 10 MG tablet Take 1 tablet (10 mg total) by mouth daily. (Patient not taking: Reported on 01/28/2024)   [DISCONTINUED] metroNIDAZOLE (FLAGYL) 500 MG tablet Take 1 tablet (500 mg total) by mouth 2 (two) times daily for 14 days.   No facility-administered medications prior to visit.    Review of Systems All negative Except see HPI       Objective    BP (!) 110/54 (BP Location: Right Arm, Patient Position: Sitting, Cuff Size: Normal)   Pulse 85   Temp 98.6 F (37 C) (Oral)   Ht 5' 2 (1.575 m)   Wt 114 lb (51.7 kg)   LMP 02/15/2014   SpO2 100%   BMI 20.85 kg/m     Physical Exam Vitals reviewed.  Constitutional:      General: She is not in acute distress.    Appearance: Normal appearance. She is well-developed. She is not diaphoretic.  HENT:     Head: Normocephalic and atraumatic.  Eyes:     General: No scleral icterus.    Conjunctiva/sclera: Conjunctivae normal.  Neck:     Thyroid: No thyromegaly.  Cardiovascular:     Rate and Rhythm: Normal rate and regular rhythm.     Pulses: Normal pulses.     Heart sounds: Normal heart sounds. No murmur heard. Pulmonary:     Effort: Pulmonary effort is normal. No respiratory distress.     Breath sounds: Normal breath sounds. No wheezing, rhonchi or rales.  Abdominal:     General: There is no distension.     Palpations: There is no mass.     Tenderness: There is abdominal tenderness (discomfort > tenderness). There is no right CVA tenderness, left CVA tenderness, guarding or rebound.     Hernia: No hernia is present.  Musculoskeletal:     Cervical back: Neck supple.     Right lower leg: No edema.     Left lower leg: No edema.  Lymphadenopathy:     Cervical: No cervical adenopathy.  Skin:    General: Skin is warm and dry.     Findings: No rash.  Neurological:     Mental Status: She is alert and oriented  to person, place, and time. Mental status is at baseline.  Psychiatric:        Mood and Affect: Mood normal.        Behavior: Behavior normal.      No results found for any  visits on 02/08/24.      Assessment & Plan Helicobacter pylori infection and peptic ulcer disease H. pylori infection confirmed. Symptoms improved with antibiotics. - Continue antibiotics for H. pylori. - Recheck H. pylori status 4 weeks post-antibiotics. - Stop PPIs 1-2 weeks before recheck. - Transition to Pepcid  before stopping PPIs. - Stop Pepcid  24 hours before H. pylori testing. Will follow-up  Dyspepsia Acid reflux symptoms present. - Implement lifestyle modifications: avoid trigger foods, eat smaller meals, avoid lying down after eating, elevate head while sleeping. - Transition from PPIs to Pepcid  before H. pylori recheck. - Stop Pepcid  24 hours before H. pylori testing.  Incision wound of foot, non-healing Non-healing incision on foot. No additional antibiotics due to current regimen. - Increase dietary protein intake to aid wound healing.  Elevated liver function tests Previous abnormal liver function tests. Lipitor discontinued. Normal liver and gallbladder on ultrasound. - Discuss liver function tests with GI specialist at upcoming appointment.  Prediabetes Chronic Prediabetes with slightly elevated blood sugar levels. - Monitor and manage sugar intake. Low car  diet and exercise advised Will follow-up  No orders of the defined types were placed in this encounter.   No follow-ups on file.   The patient was advised to call back or seek an in-person evaluation if the symptoms worsen or if the condition fails to improve as anticipated.  I discussed the assessment and treatment plan with the patient. The patient was provided an opportunity to ask questions and all were answered. The patient agreed with the plan and demonstrated an understanding of the instructions.  I, Anai Lipson, PA-C  have reviewed all documentation for this visit. The documentation on 02/08/2024  for the exam, diagnosis, procedures, and orders are all accurate and complete.  Jolynn Spencer, [facility], MMS [facility] [phone] (phone) [phone] (fax)  [facility]

## 2024-02-11 DIAGNOSIS — A048 Other specified bacterial intestinal infections: Secondary | ICD-10-CM | POA: Insufficient documentation

## 2024-02-13 ENCOUNTER — Ambulatory Visit: Payer: Self-pay | Admitting: Family Medicine

## 2024-02-20 DIAGNOSIS — T8131XA Disruption of external operation (surgical) wound, not elsewhere classified, initial encounter: Secondary | ICD-10-CM | POA: Diagnosis not present

## 2024-02-20 DIAGNOSIS — T8131XD Disruption of external operation (surgical) wound, not elsewhere classified, subsequent encounter: Secondary | ICD-10-CM | POA: Diagnosis not present

## 2024-02-20 DIAGNOSIS — M2011 Hallux valgus (acquired), right foot: Secondary | ICD-10-CM | POA: Diagnosis not present

## 2024-02-20 DIAGNOSIS — Z09 Encounter for follow-up examination after completed treatment for conditions other than malignant neoplasm: Secondary | ICD-10-CM | POA: Diagnosis not present

## 2024-02-20 DIAGNOSIS — M249 Joint derangement, unspecified: Secondary | ICD-10-CM | POA: Diagnosis not present

## 2024-03-03 ENCOUNTER — Other Ambulatory Visit (INDEPENDENT_AMBULATORY_CARE_PROVIDER_SITE_OTHER): Payer: Self-pay | Admitting: Podiatry

## 2024-03-03 DIAGNOSIS — I739 Peripheral vascular disease, unspecified: Secondary | ICD-10-CM

## 2024-03-04 ENCOUNTER — Other Ambulatory Visit (INDEPENDENT_AMBULATORY_CARE_PROVIDER_SITE_OTHER)

## 2024-03-04 DIAGNOSIS — I739 Peripheral vascular disease, unspecified: Secondary | ICD-10-CM

## 2024-03-05 DIAGNOSIS — T8131XD Disruption of external operation (surgical) wound, not elsewhere classified, subsequent encounter: Secondary | ICD-10-CM | POA: Diagnosis not present

## 2024-03-05 DIAGNOSIS — R7989 Other specified abnormal findings of blood chemistry: Secondary | ICD-10-CM | POA: Diagnosis not present

## 2024-03-05 DIAGNOSIS — M249 Joint derangement, unspecified: Secondary | ICD-10-CM | POA: Diagnosis not present

## 2024-03-05 DIAGNOSIS — R1013 Epigastric pain: Secondary | ICD-10-CM | POA: Diagnosis not present

## 2024-03-05 DIAGNOSIS — M79671 Pain in right foot: Secondary | ICD-10-CM | POA: Diagnosis not present

## 2024-03-05 DIAGNOSIS — A048 Other specified bacterial intestinal infections: Secondary | ICD-10-CM | POA: Diagnosis not present

## 2024-03-05 DIAGNOSIS — M2011 Hallux valgus (acquired), right foot: Secondary | ICD-10-CM | POA: Diagnosis not present

## 2024-03-05 DIAGNOSIS — M2041 Other hammer toe(s) (acquired), right foot: Secondary | ICD-10-CM | POA: Diagnosis not present

## 2024-03-05 LAB — VAS US ABI WITH/WO TBI
Left ABI: 1.18
Right ABI: 1.19

## 2024-03-06 ENCOUNTER — Encounter: Payer: Self-pay | Admitting: Podiatry

## 2024-03-06 ENCOUNTER — Other Ambulatory Visit: Payer: Self-pay | Admitting: Podiatry

## 2024-03-06 ENCOUNTER — Other Ambulatory Visit: Payer: Self-pay | Admitting: Gastroenterology

## 2024-03-06 DIAGNOSIS — M79671 Pain in right foot: Secondary | ICD-10-CM

## 2024-03-06 DIAGNOSIS — M898X9 Other specified disorders of bone, unspecified site: Secondary | ICD-10-CM

## 2024-03-06 DIAGNOSIS — M249 Joint derangement, unspecified: Secondary | ICD-10-CM

## 2024-03-06 DIAGNOSIS — R1013 Epigastric pain: Secondary | ICD-10-CM

## 2024-03-06 DIAGNOSIS — M2011 Hallux valgus (acquired), right foot: Secondary | ICD-10-CM

## 2024-03-06 DIAGNOSIS — T8131XD Disruption of external operation (surgical) wound, not elsewhere classified, subsequent encounter: Secondary | ICD-10-CM

## 2024-03-06 DIAGNOSIS — M2041 Other hammer toe(s) (acquired), right foot: Secondary | ICD-10-CM

## 2024-03-06 DIAGNOSIS — R7989 Other specified abnormal findings of blood chemistry: Secondary | ICD-10-CM | POA: Diagnosis not present

## 2024-03-07 ENCOUNTER — Ambulatory Visit
Admission: RE | Admit: 2024-03-07 | Discharge: 2024-03-07 | Disposition: A | Discharge status: Home / Self Care | Source: Ambulatory Visit | Attending: Podiatry | Admitting: Podiatry

## 2024-03-07 DIAGNOSIS — T8131XD Disruption of external operation (surgical) wound, not elsewhere classified, subsequent encounter: Secondary | ICD-10-CM

## 2024-03-07 DIAGNOSIS — M249 Joint derangement, unspecified: Secondary | ICD-10-CM

## 2024-03-07 DIAGNOSIS — M2041 Other hammer toe(s) (acquired), right foot: Secondary | ICD-10-CM

## 2024-03-07 DIAGNOSIS — M898X9 Other specified disorders of bone, unspecified site: Secondary | ICD-10-CM

## 2024-03-07 DIAGNOSIS — M79671 Pain in right foot: Secondary | ICD-10-CM

## 2024-03-07 DIAGNOSIS — M2011 Hallux valgus (acquired), right foot: Secondary | ICD-10-CM

## 2024-03-10 ENCOUNTER — Other Ambulatory Visit: Payer: Self-pay | Admitting: Podiatry

## 2024-03-10 ENCOUNTER — Other Ambulatory Visit: Payer: Self-pay | Admitting: Gastroenterology

## 2024-03-10 ENCOUNTER — Other Ambulatory Visit: Payer: Self-pay | Admitting: Family Medicine

## 2024-03-10 ENCOUNTER — Ambulatory Visit
Admission: RE | Admit: 2024-03-10 | Discharge: 2024-03-10 | Disposition: A | Discharge status: Home / Self Care | Source: Ambulatory Visit | Attending: Gastroenterology | Admitting: Gastroenterology

## 2024-03-10 DIAGNOSIS — R1013 Epigastric pain: Secondary | ICD-10-CM | POA: Insufficient documentation

## 2024-03-10 DIAGNOSIS — Z1231 Encounter for screening mammogram for malignant neoplasm of breast: Secondary | ICD-10-CM

## 2024-03-10 DIAGNOSIS — R109 Unspecified abdominal pain: Secondary | ICD-10-CM | POA: Diagnosis not present

## 2024-03-10 MED ORDER — GADOBUTROL 1 MMOL/ML IV SOLN
5.0000 mL | Freq: Once | INTRAVENOUS | Status: AC | PRN
  Administered 2024-03-10: 5 mL via INTRAVENOUS

## 2024-03-12 ENCOUNTER — Ambulatory Visit

## 2024-03-12 ENCOUNTER — Ambulatory Visit: Admitting: Anesthesiology

## 2024-03-12 ENCOUNTER — Encounter: Payer: Self-pay | Admitting: Podiatry

## 2024-03-12 ENCOUNTER — Other Ambulatory Visit: Payer: Self-pay

## 2024-03-12 ENCOUNTER — Encounter
Admission: RE | Disposition: A | Discharge status: Home / Self Care | Payer: Self-pay | Source: Home / Self Care | Attending: Podiatry

## 2024-03-12 ENCOUNTER — Ambulatory Visit
Admission: RE | Admit: 2024-03-12 | Discharge: 2024-03-12 | Disposition: A | Discharge status: Home / Self Care | Attending: Podiatry | Admitting: Podiatry

## 2024-03-12 DIAGNOSIS — M1990 Unspecified osteoarthritis, unspecified site: Secondary | ICD-10-CM | POA: Insufficient documentation

## 2024-03-12 DIAGNOSIS — I10 Essential (primary) hypertension: Secondary | ICD-10-CM | POA: Diagnosis not present

## 2024-03-12 DIAGNOSIS — T8131XA Disruption of external operation (surgical) wound, not elsewhere classified, initial encounter: Secondary | ICD-10-CM | POA: Diagnosis not present

## 2024-03-12 DIAGNOSIS — M2011 Hallux valgus (acquired), right foot: Secondary | ICD-10-CM | POA: Diagnosis not present

## 2024-03-12 DIAGNOSIS — Z969 Presence of functional implant, unspecified: Secondary | ICD-10-CM

## 2024-03-12 DIAGNOSIS — J45909 Unspecified asthma, uncomplicated: Secondary | ICD-10-CM | POA: Diagnosis not present

## 2024-03-12 DIAGNOSIS — M898X7 Other specified disorders of bone, ankle and foot: Secondary | ICD-10-CM | POA: Diagnosis not present

## 2024-03-12 DIAGNOSIS — Z01812 Encounter for preprocedural laboratory examination: Secondary | ICD-10-CM

## 2024-03-12 DIAGNOSIS — Y838 Other surgical procedures as the cause of abnormal reaction of the patient, or of later complication, without mention of misadventure at the time of the procedure: Secondary | ICD-10-CM | POA: Insufficient documentation

## 2024-03-12 DIAGNOSIS — M899 Disorder of bone, unspecified: Secondary | ICD-10-CM | POA: Diagnosis not present

## 2024-03-12 DIAGNOSIS — Z9889 Other specified postprocedural states: Secondary | ICD-10-CM | POA: Diagnosis not present

## 2024-03-12 DIAGNOSIS — K219 Gastro-esophageal reflux disease without esophagitis: Secondary | ICD-10-CM | POA: Diagnosis not present

## 2024-03-12 DIAGNOSIS — M2041 Other hammer toe(s) (acquired), right foot: Secondary | ICD-10-CM | POA: Diagnosis not present

## 2024-03-12 DIAGNOSIS — T84293A Other mechanical complication of internal fixation device of bones of foot and toes, initial encounter: Secondary | ICD-10-CM | POA: Diagnosis not present

## 2024-03-12 HISTORY — PX: HARDWARE REMOVAL: SHX979

## 2024-03-12 SURGERY — REMOVAL, HARDWARE
Anesthesia: General | Site: Foot | Laterality: Right

## 2024-03-12 MED ORDER — DROPERIDOL 2.5 MG/ML IJ SOLN: 0.6250 mg | Freq: Once | INTRAMUSCULAR | Status: DC | PRN

## 2024-03-12 MED ORDER — LIDOCAINE HCL (PF) 2 % IJ SOLN
INTRAMUSCULAR | Status: AC
Start: 2024-03-12 — End: 2024-03-12
  Filled 2024-03-12: qty 5

## 2024-03-12 MED ORDER — ACETAMINOPHEN 10 MG/ML IV SOLN
INTRAVENOUS | Status: DC | PRN
  Administered 2024-03-12: 1000 mg via INTRAVENOUS

## 2024-03-12 MED ORDER — DEXAMETHASONE SOD PHOSPHATE PF 10 MG/ML IJ SOLN
INTRAMUSCULAR | Status: DC | PRN
Start: 2024-03-12 — End: 2024-03-12
  Administered 2024-03-12: 10 mg via INTRAVENOUS

## 2024-03-12 MED ORDER — ACETAMINOPHEN 10 MG/ML IV SOLN: 1000.0000 mg | Freq: Once | INTRAVENOUS | Status: DC | PRN

## 2024-03-12 MED ORDER — BUPIVACAINE HCL (PF) 0.5 % IJ SOLN
INTRAMUSCULAR | Status: AC
Start: 2024-03-12 — End: 2024-03-12
  Filled 2024-03-12: qty 30

## 2024-03-12 MED ORDER — CHLORHEXIDINE GLUCONATE 0.12 % MT SOLN
15.0000 mL | Freq: Once | OROMUCOSAL | Status: AC
  Administered 2024-03-12: 15 mL via OROMUCOSAL

## 2024-03-12 MED ORDER — PROPOFOL 10 MG/ML IV BOLUS
INTRAVENOUS | Status: DC | PRN
  Administered 2024-03-12: 70 mg via INTRAVENOUS
  Administered 2024-03-12: 130 mg via INTRAVENOUS

## 2024-03-12 MED ORDER — OXYCODONE HCL 5 MG/5ML PO SOLN: 5.0000 mg | Freq: Once | ORAL | Status: DC | PRN

## 2024-03-12 MED ORDER — CEFAZOLIN SODIUM-DEXTROSE 2-4 GM/100ML-% IV SOLN
INTRAVENOUS | Status: AC
  Filled 2024-03-12: qty 100

## 2024-03-12 MED ORDER — BUPIVACAINE LIPOSOME 1.3 % IJ SUSP
INTRAMUSCULAR | Status: DC | PRN
  Administered 2024-03-12: 10 mL

## 2024-03-12 MED ORDER — ONDANSETRON HCL 4 MG/2ML IJ SOLN
INTRAMUSCULAR | Status: DC | PRN
  Administered 2024-03-12: 4 mg via INTRAVENOUS

## 2024-03-12 MED ORDER — FENTANYL CITRATE (PF) 100 MCG/2ML IJ SOLN
INTRAMUSCULAR | Status: AC
  Filled 2024-03-12: qty 2

## 2024-03-12 MED ORDER — ONDANSETRON HCL 4 MG/2ML IJ SOLN
INTRAMUSCULAR | Status: AC
Start: 2024-03-12 — End: 2024-03-12
  Filled 2024-03-12: qty 2

## 2024-03-12 MED ORDER — MIDAZOLAM HCL 2 MG/2ML IJ SOLN
INTRAMUSCULAR | Status: AC
  Filled 2024-03-12: qty 2

## 2024-03-12 MED ORDER — LACTATED RINGERS IV SOLN
INTRAVENOUS | Status: DC
Start: 2024-03-12 — End: 2024-03-12

## 2024-03-12 MED ORDER — CEFAZOLIN SODIUM-DEXTROSE 2-4 GM/100ML-% IV SOLN
2.0000 g | INTRAVENOUS | Status: AC
  Administered 2024-03-12: 2 g via INTRAVENOUS

## 2024-03-12 MED ORDER — ASPIRIN 81 MG PO TBEC: 81.0000 mg | DELAYED_RELEASE_TABLET | Freq: Two times a day (BID) | ORAL | 0 refills | Status: AC

## 2024-03-12 MED ORDER — ACETAMINOPHEN 10 MG/ML IV SOLN
INTRAVENOUS | Status: AC
  Filled 2024-03-12: qty 100

## 2024-03-12 MED ORDER — ORAL CARE MOUTH RINSE: 15.0000 mL | Freq: Once | OROMUCOSAL | Status: AC

## 2024-03-12 MED ORDER — LIDOCAINE HCL (CARDIAC) PF 100 MG/5ML IV SOSY
PREFILLED_SYRINGE | INTRAVENOUS | Status: DC | PRN
  Administered 2024-03-12: 60 mg via INTRAVENOUS

## 2024-03-12 MED ORDER — EPHEDRINE 5 MG/ML INJ
INTRAVENOUS | Status: AC
  Filled 2024-03-12: qty 5

## 2024-03-12 MED ORDER — 0.9 % SODIUM CHLORIDE (POUR BTL) OPTIME
TOPICAL | Status: DC | PRN
Start: 2024-03-12 — End: 2024-03-12
  Administered 2024-03-12: 500 mL

## 2024-03-12 MED ORDER — CHLORHEXIDINE GLUCONATE 0.12 % MT SOLN
OROMUCOSAL | Status: AC
  Filled 2024-03-12: qty 15

## 2024-03-12 MED ORDER — FENTANYL CITRATE (PF) 100 MCG/2ML IJ SOLN
INTRAMUSCULAR | Status: DC | PRN
  Administered 2024-03-12 (×2): 25 ug via INTRAVENOUS

## 2024-03-12 MED ORDER — OXYCODONE HCL 5 MG PO TABS: 5.0000 mg | ORAL_TABLET | Freq: Once | ORAL | Status: DC | PRN

## 2024-03-12 MED ORDER — EPHEDRINE SULFATE-NACL 50-0.9 MG/10ML-% IV SOSY
PREFILLED_SYRINGE | INTRAVENOUS | Status: DC | PRN
  Administered 2024-03-12 (×2): 10 mg via INTRAVENOUS
  Administered 2024-03-12: 5 mg via INTRAVENOUS

## 2024-03-12 MED ORDER — FENTANYL CITRATE (PF) 100 MCG/2ML IJ SOLN: 25.0000 ug | INTRAMUSCULAR | Status: DC | PRN

## 2024-03-12 MED ORDER — BUPIVACAINE LIPOSOME 1.3 % IJ SUSP
INTRAMUSCULAR | Status: AC
  Filled 2024-03-12: qty 10

## 2024-03-12 MED ORDER — BUPIVACAINE HCL (PF) 0.5 % IJ SOLN
INTRAMUSCULAR | Status: DC | PRN
  Administered 2024-03-12: 20 mL

## 2024-03-12 MED ORDER — HYDROCODONE-ACETAMINOPHEN 5-325 MG PO TABS: 1.0000 | ORAL_TABLET | Freq: Four times a day (QID) | ORAL | 0 refills | Status: DC | PRN

## 2024-03-12 MED ORDER — MIDAZOLAM HCL (PF) 2 MG/2ML IJ SOLN
INTRAMUSCULAR | Status: DC | PRN
  Administered 2024-03-12: 2 mg via INTRAVENOUS

## 2024-03-12 MED ORDER — PROPOFOL 10 MG/ML IV BOLUS
INTRAVENOUS | Status: AC
  Filled 2024-03-12: qty 20

## 2024-03-12 SURGICAL SUPPLY — 34 items
BLADE SURG 15 STRL LF DISP TIS (BLADE) ×2 IMPLANT
BNDG ELASTIC 4X5.8 VLCR NS LF (GAUZE/BANDAGES/DRESSINGS) ×1 IMPLANT
BNDG ESMARCH 4X12 STRL LF (GAUZE/BANDAGES/DRESSINGS) ×1 IMPLANT
COLLAGEN CELLERATERX 1 GRAM (Miscellaneous) IMPLANT
CUFF TOURN SGL QUICK 12 (TOURNIQUET CUFF) IMPLANT
CUFF TOURN SGL QUICK 18X4 (TOURNIQUET CUFF) IMPLANT
DRAPE FLUOR MINI C-ARM 54X84 (DRAPES) ×1 IMPLANT
DURAPREP 26ML APPLICATOR (WOUND CARE) ×1 IMPLANT
ELECTRODE REM PT RTRN 9FT ADLT (ELECTROSURGICAL) ×1 IMPLANT
GAUZE SPONGE 4X4 12PLY STRL (GAUZE/BANDAGES/DRESSINGS) ×1 IMPLANT
GAUZE XEROFORM 1X8 LF (GAUZE/BANDAGES/DRESSINGS) ×1 IMPLANT
GLOVE BIO SURGEON STRL SZ7 (GLOVE) ×1 IMPLANT
GLOVE INDICATOR 7.0 STRL GRN (GLOVE) ×1 IMPLANT
GOWN STRL REUS W/ TWL LRG LVL3 (GOWN DISPOSABLE) ×1 IMPLANT
MANIFOLD NEPTUNE II (INSTRUMENTS) ×1 IMPLANT
NDL FILTER BLUNT 18X1 1/2 (NEEDLE) ×1 IMPLANT
NDL HYPO 25X1 1.5 SAFETY (NEEDLE) ×3 IMPLANT
NS IRRIG 500ML POUR BTL (IV SOLUTION) ×1 IMPLANT
PACK EXTREMITY ARMC (MISCELLANEOUS) ×1 IMPLANT
PAD CAST 3X4 CTTN HI CHSV (CAST SUPPLIES) IMPLANT
PENCIL SMOKE EVACUATOR (MISCELLANEOUS) ×1 IMPLANT
PUTTY DBX 1CC DEPUY (Putty) IMPLANT
SOLN STERILE WATER 500 ML (IV SOLUTION) ×1 IMPLANT
STIMULATOR BONE GROWTH EMG EXT (ORTHOPEDIC SUPPLIES) IMPLANT
STOCKINETTE STRL 6IN 960660 (GAUZE/BANDAGES/DRESSINGS) ×1 IMPLANT
STRIP CLOSURE SKIN 1/4X4 (GAUZE/BANDAGES/DRESSINGS) ×1 IMPLANT
SUT ETHILON 3-0 (SUTURE) IMPLANT
SUT MNCRL AB 4-0 PS2 18 (SUTURE) IMPLANT
SUT VIC AB 3-0 SH 27X BRD (SUTURE) IMPLANT
SUTURE EHLN 3-0 FS-10 30 BLK (SUTURE) IMPLANT
SYR 10ML LL (SYRINGE) ×1 IMPLANT
SYR 20ML LL LF (SYRINGE) ×1 IMPLANT
SYR 3ML LL SCALE MARK (SYRINGE) ×1 IMPLANT
TRAP FLUID SMOKE EVACUATOR (MISCELLANEOUS) ×1 IMPLANT

## 2024-03-12 NOTE — H&P (Signed)
 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  The various methods of treatment have been discussed with the patient.  No guarantees were given.  After consideration of risks, benefits and other options for treatment, the patient has consented to surgery.  I have reviewed the patients' chart and labs.    PROCEDURE: RIGHT FOOT REMOVAL OF HARDWARE USE OF INTRA-OP FLUOROSCOPY POSSIBLE BONE STIMULATOR APPLICATION  A history and physical examination was performed in my office.  The patient was reexamined.  There have been no changes to this history and physical examination.  Prentice Lee, DPM

## 2024-03-12 NOTE — Anesthesia Procedure Notes (Addendum)
 Procedure Name: LMA Insertion Date/Time: 03/12/2024 7:43 AM  Performed by: Lorriane Arabia, CRNAPre-anesthesia Checklist: Patient identified, Patient being monitored, Timeout performed, Emergency Drugs available and Suction available Patient Re-evaluated:Patient Re-evaluated prior to induction Oxygen Delivery Method: Circle system utilized Preoxygenation: Pre-oxygenation with 100% oxygen Induction Type: IV induction Ventilation: Mask ventilation without difficulty LMA: LMA inserted LMA Size: 3.0 Tube type: Oral Number of attempts: 3 Placement Confirmation: positive ETCO2 and breath sounds checked- equal and bilateral Tube secured with: Tape Dental Injury: Teeth and Oropharynx as per pre-operative assessment and Bloody posterior oropharynx

## 2024-03-12 NOTE — Op Note (Signed)
 PODIATRY / FOOT AND ANKLE SURGERY OPERATIVE REPORT    SURGEON: Prentice Lee, DPM  PRE-OPERATIVE DIAGNOSIS:  1.  Wound dehiscence after surgical incision right foot status post Lapidus bunionectomy 2.  Retained orthopedic hardware right foot  POST-OPERATIVE DIAGNOSIS: Same  PROCEDURE(S): Right foot removal of orthopedic hardware Use of intraoperative fluoroscopy  HEMOSTASIS: Right ankle tourniquet  ANESTHESIA: MAC  ESTIMATED BLOOD LOSS: 20 cc  FINDING(S): 1.  No obvious signs of infection present around the orthopedic hardware but wound did probe to the dorsal plate. 2.  Appeared to have good union across first tarsometatarsal joint area. 3.  Appeared to have soft bone around the areas of the screw removal sites with some minor microfracturing dorsally at the first metatarsal base  PATHOLOGY/SPECIMEN(S): Right first metatarsal bone culture and path specimen  INDICATIONS:   Anne Swanson is a 63 y.o. female who presents with a nonhealing incision site status post 10 weeks after a Lapidus bunionectomy.  Patient healed 2/3 incisions but did not heal the incision at the dorsal first TMT J.  Patient has been treated with local wound care and the wound has gotten smaller but still is present with some mild surrounding erythema and edema.  At last examination of patient she appeared to have plate exposed through the procedure site.  CT imaging was obtained showing what appeared to be mostly fusion across the first tarsometatarsal joint with no signs of infection present.  Patient has been treated with oral antibiotics due to some of the erythema present as well as hardware exposed.  All treatment options were discussed with the patient and patient's family both conservative and surgical attempts at correction include potential risks and complications at this time patient has elected for procedure today consisting of right foot hardware removal.  No guarantees given.  Discussed with patient  that if bone healing appeared to be compromised would recommend a period of nonweightbearing for a few weeks along with bone stimulator application.  Patient understands and is agreeable.  DESCRIPTION: After obtaining full informed written consent, the patient was brought back to the operating room and placed supine upon the operating table.  The patient received IV antibiotics prior to induction.  After obtaining adequate anesthesia, the patient was prepped and draped in the standard fashion.  20 cc of quarter percent Marcaine  plain was injected about the right ankle and an ankle block.  An Esmarch bandage used to exsanguinate the right lower extremity pneumatic ankle tourniquet was inflated.  At this time attention was then directed to the dorsal first TMT J incision site in the area of the wound dehiscence.  A 3-1 type of elliptical incision was made over this area excising the wound and extending the incision slightly proximal and distal to this area.  At this time the skin was ellipsed and passed off the operative site.  At this time the extensor hallucis longus tendon was identified.  A capsular incision and periosteal incision was then made into the first tarsometatarsal joint area.  The Periosteal Tissue Was Reflected Medially and Laterally Thereby Exposing Both Plates at the Operative Site at the First Tarsometatarsal Joint.  Both Plates Were Then Removed with Their 4 Corresponding Screws Each and Were Passed off the Operative Site.  The First Tarsometatarsal Joint Was Inspected and Appeared to Be Mostly Healed in General with No Obvious Gapping to the First Tarsometatarsal Joint and No Obvious Movement with Stressing of the Joint.  The Surgical Site Was Flushed with Copious Amounts Normal  Sterile Saline.  There Did Appear to Be a Small Stress Riser in between the Most Distal Screws of the Dorsal Plate.  Bone Samples Were Taken of This Area and 1 Was Taken and Sent off for Culture and the Other Was for  Pathology.  There Were No Signs of Infection Present Other Than That Somewhat Soft Bone to This Area.  Due To the Slight Void That Was Present to the Area 1 Cc of DBM Bone Putty Was Then Placed into the Area to Fill That Void.  This Was Also Used since It Was Available and Placed into the Screw Hole Sites to Aid in Bone Healing Further.  The surgical site was once again flushed with copious amounts normal sterile saline.  The area was inspected and noted to have no further large openings.  At this time collagen powder was then placed over the area and the periosteal structures were reapproximated well coapted with 3-0 Vicryl as well as the extensor tendon sheath.  Another layer of collagen powder was then placed on top of this and the subcutaneous tissue was reapproximated well coapted with 4-0 Monocryl.  The skin was then reapproximated well coapted with 3-0 nylon horizontal mattress type stitching with the tourniquet down.  A prompt hyperemic response was noted all digits of the right foot and the incisional edges appeared to be pink and healthy.  10 cc of Exparel  was then injected about the operative area.  A postoperative dressing was then applied consisting of Xeroform followed by 4 x 4 gauze, Kerlix, Ace wrap.  The patient tolerated the procedure and anesthesia well and was transferred to the recovery room with vital signs stable and vascular status intact to all toes of the right foot as well as incision site.  Following a period of postoperative monitoring the patient be discharged home with the appropriate orders, instructions, medications.  Of note bone stimulator was placed on patient in the PACU area due to concerns for the stress riser and to aid in additional healing to the first tarsometatarsal joint fusion site.  Patient is to be nonweightbearing for at least 2 weeks.  COMPLICATIONS: None  CONDITION: Good, stable  Prentice Lee, DPM

## 2024-03-12 NOTE — Transfer of Care (Signed)
 Immediate Anesthesia Transfer of Care Note  Patient: Anne Swanson  Procedure(s) Performed: REMOVAL, HARDWARE (Right: Foot)  Patient Location: PACU  Anesthesia Type:General  Level of Consciousness: drowsy and patient cooperative  Airway & Oxygen Therapy: Patient Spontanous Breathing and Patient connected to nasal cannula oxygen  Post-op Assessment: Report given to RN and Post -op Vital signs reviewed and stable  Post vital signs: Reviewed and stable  Last Vitals:  Vitals Value Taken Time  BP 137/63 03/12/24 09:35  Temp 36.1 C 03/12/24 08:51  Pulse 55 03/12/24 09:27  Resp 16 03/12/24 09:26  SpO2 100 % 03/12/24 09:27  Vitals shown include unfiled device data.  Last Pain:  Vitals:   03/12/24 0915  TempSrc:   PainSc: 0-No pain         Complications: No notable events documented.

## 2024-03-12 NOTE — Anesthesia Preprocedure Evaluation (Addendum)
 Anesthesia Evaluation  Patient identified by MRN, date of birth, ID band Patient awake    Reviewed: Allergy & Precautions, H&P , NPO status , Patient's Chart, lab work & pertinent test results  History of Anesthesia Complications (+) history of anesthetic complications  Airway Mallampati: II  TM Distance: >3 FB Neck ROM: Full    Dental no notable dental hx.    Pulmonary asthma    Pulmonary exam normal breath sounds clear to auscultation       Cardiovascular hypertension, Normal cardiovascular exam Rhythm:Regular Rate:Normal     Neuro/Psych  Headaches  negative psych ROS   GI/Hepatic Neg liver ROS,GERD  ,,  Endo/Other  negative endocrine ROS    Renal/GU negative Renal ROS  negative genitourinary   Musculoskeletal negative musculoskeletal ROS (+) Arthritis ,    Abdominal   Peds negative pediatric ROS (+)  Hematology negative hematology ROS (+)   Anesthesia Other Findings Current work up for abdominal pain looking for bile duct stone  Fibrocystic breast disease  Osteopenia Asthma  High cholesterol Vertigo  Anemia Migraines  Complication of anesthesia--itching after spinal GERD (gastroesophageal reflux disease) Hypertension Arthritis  Allergy    Reproductive/Obstetrics negative OB ROS                              Anesthesia Physical Anesthesia Plan  ASA: 2  Anesthesia Plan: General   Post-op Pain Management: Toradol IV (intra-op)* and Ofirmev  IV (intra-op)*   Induction: Intravenous  PONV Risk Score and Plan:   Airway Management Planned: LMA  Additional Equipment:   Intra-op Plan:   Post-operative Plan: Extubation in OR  Informed Consent: I have reviewed the patients History and Physical, chart, labs and discussed the procedure including the risks, benefits and alternatives for the proposed anesthesia with the patient or authorized representative who has indicated  his/her understanding and acceptance.     Dental Advisory Given  Plan Discussed with: Anesthesiologist, CRNA and Surgeon  Anesthesia Plan Comments:          Anesthesia Quick Evaluation

## 2024-03-12 NOTE — Anesthesia Postprocedure Evaluation (Signed)
 Anesthesia Post Note  Patient: Anne Swanson  Procedure(s) Performed: REMOVAL, HARDWARE (Right: Foot)  Patient location during evaluation: PACU Anesthesia Type: General Level of consciousness: awake and alert Pain management: pain level controlled Vital Signs Assessment: post-procedure vital signs reviewed and stable Respiratory status: spontaneous breathing, nonlabored ventilation and respiratory function stable Cardiovascular status: blood pressure returned to baseline and stable Postop Assessment: no apparent nausea or vomiting Anesthetic complications: no   No notable events documented.   Last Vitals:  Vitals:   03/12/24 0915 03/12/24 0935  BP: (!) 149/60 137/63  Pulse: 69   Resp: 19   Temp:    SpO2: 100%     Last Pain:  Vitals:   03/12/24 0915  TempSrc:   PainSc: 0-No pain                 Camellia Merilee Louder

## 2024-03-14 ENCOUNTER — Encounter: Payer: Self-pay | Admitting: Podiatry

## 2024-03-14 LAB — SURGICAL PATHOLOGY

## 2024-03-17 DIAGNOSIS — R109 Unspecified abdominal pain: Secondary | ICD-10-CM | POA: Diagnosis not present

## 2024-03-17 LAB — AEROBIC/ANAEROBIC CULTURE W GRAM STAIN (SURGICAL/DEEP WOUND)
Culture: NO GROWTH
Gram Stain: NONE SEEN

## 2024-03-19 ENCOUNTER — Encounter: Payer: Self-pay | Admitting: Podiatry

## 2024-03-19 DIAGNOSIS — M2011 Hallux valgus (acquired), right foot: Secondary | ICD-10-CM | POA: Diagnosis not present

## 2024-03-19 DIAGNOSIS — T8131XD Disruption of external operation (surgical) wound, not elsewhere classified, subsequent encounter: Secondary | ICD-10-CM | POA: Diagnosis not present

## 2024-03-19 DIAGNOSIS — M2041 Other hammer toe(s) (acquired), right foot: Secondary | ICD-10-CM | POA: Diagnosis not present

## 2024-03-28 ENCOUNTER — Encounter: Payer: Self-pay | Admitting: Family Medicine

## 2024-03-28 ENCOUNTER — Ambulatory Visit: Payer: Self-pay | Admitting: Family Medicine

## 2024-03-28 VITALS — BP 114/62 | HR 72 | Resp 16 | Ht 62.0 in | Wt 118.0 lb

## 2024-03-28 DIAGNOSIS — Z0000 Encounter for general adult medical examination without abnormal findings: Secondary | ICD-10-CM

## 2024-03-28 DIAGNOSIS — Z23 Encounter for immunization: Secondary | ICD-10-CM

## 2024-03-28 DIAGNOSIS — B9681 Helicobacter pylori [H. pylori] as the cause of diseases classified elsewhere: Secondary | ICD-10-CM

## 2024-03-28 DIAGNOSIS — Z8249 Family history of ischemic heart disease and other diseases of the circulatory system: Secondary | ICD-10-CM

## 2024-03-28 DIAGNOSIS — Z0001 Encounter for general adult medical examination with abnormal findings: Secondary | ICD-10-CM | POA: Diagnosis not present

## 2024-03-28 DIAGNOSIS — K2970 Gastritis, unspecified, without bleeding: Secondary | ICD-10-CM | POA: Diagnosis not present

## 2024-03-28 DIAGNOSIS — E782 Mixed hyperlipidemia: Secondary | ICD-10-CM | POA: Diagnosis not present

## 2024-03-28 DIAGNOSIS — R748 Abnormal levels of other serum enzymes: Secondary | ICD-10-CM | POA: Diagnosis not present

## 2024-03-28 NOTE — Progress Notes (Signed)
 Complete physical exam   Patient: Anne Swanson   DOB: Aug 16, 1960   63 y.o. Female  MRN: [id number] Visit Date: 03/28/2024  Today's healthcare provider: Nancyann Perry, MD   Chief Complaint  Patient presents with   Annual Exam    Last Exam Physical: 03/28/2023 Diet: high protein, 4 small meals Exercise: currently not exercising due to her right foot surgery, she usually walks 5 miles a day Feeling: Fairly Well Sleeping: Well overall Vaccines: patient currently on abx and will wait until she finished No concerns    Subjective    Discussed the use of AI scribe software for clinical note transcription with the patient, who gave verbal consent to proceed.  History of Present Illness   Anne Swanson is a 63 year old female who presents for an annual physical exam.  She has a history of foot issues, including a bunion and hematoma, which required two surgeries. She experienced an open wound for eleven weeks due to rejection of titanium, but it has since healed, and she received clearance to bear weight on it.  She has been experiencing gastrointestinal issues, including a sensation of passing gallstones after eating, which has improved with antibiotics for H. pylori. She is currently on her second round of antibiotics, levofloxacin, and experiences joint pain and nausea as side effects. Her liver enzymes were previously elevated, but recent tests show they have mostly returned to normal. She underwent an ultrasound and MRCP. The gallstone-like pain subsided after starting antibiotics. She is scheduled for a follow-up next month.  She has a history of elevated cholesterol and has been on Crestor  and Lipitor. she had significant brain on when she was taking Crestor , which resolved when she was changed to Lipitor. However ,the episode of RUQ pain and high liver functions occurred a few months after starting atorvastatin .  She is not currently on any statin and has made dietary  changes, resulting in weight loss.  She reports a history of tinnitus related to past vertigo issues, but no new hearing problems. She denies any breathing difficulties or shortness of breath.      HPI     Annual Exam    Additional comments: Last Exam Physical: 03/28/2023 Diet: high protein, 4 small meals Exercise: currently not exercising due to her right foot surgery, she usually walks 5 miles a day Feeling: Fairly Well Sleeping: Well overall Vaccines: patient currently on abx and will wait until she finished No concerns       Last edited by Ellery Gerald BRAVO, CMA on 03/28/2024  9:48 AM.       Past Medical History:  Diagnosis Date   Allergy    Anemia    Arthritis    Asthma    Complication of anesthesia    ITCHING X 30 HOURS AFTER SPINAL 1990   Fibrocystic breast disease    GERD (gastroesophageal reflux disease)    High cholesterol    Hypertension summer 2023   stress at work   Migraines    Osteopenia    Vertigo    Past Surgical History:  Procedure Laterality Date   CESAREAN SECTION     x3   CHOLECYSTECTOMY N/A 08/06/2017   Procedure: LAPAROSCOPIC CHOLECYSTECTOMY WITH INTRAOPERATIVE CHOLANGIOGRAM;  Surgeon: Dessa Reyes ORN, MD;  Location: [facility];  Service: General;  Laterality: N/A;   COLONOSCOPY     COLONOSCOPY WITH PROPOFOL  N/A 03/02/2020   Procedure: COLONOSCOPY WITH PROPOFOL ;  Surgeon: Jinny Carmine, MD;  Location: [facility];  Service: Endoscopy;  Laterality: N/A;   HALLUX VALGUS LAPIDUS Right 12/27/2023   Procedure: ROMAYNE LOOK;  Surgeon: Lennie Barter, DPM;  Location: [facility];  Service: Orthopedics/Podiatry;  Laterality: Right;   HAMMER TOE SURGERY Right 12/27/2023   Procedure: CORRECTION, HAMMER TOE;  Surgeon: Lennie Barter, DPM;  Location: [facility];  Service: Orthopedics/Podiatry;  Laterality: Right;   HARDWARE REMOVAL Right 03/12/2024   Procedure: REMOVAL, HARDWARE;  Surgeon: Lennie Barter, DPM;  Location: [facility];   Service: Orthopedics/Podiatry;  Laterality: Right;   WISDOM TOOTH EXTRACTION     Social History   Socioeconomic History   Marital status: Married    Spouse name: Not on file   Number of children: 3   Years of education: Not on file   Highest education level: Bachelor's degree (e.g., BA, AB, BS)  Occupational History   Occupation: Accountant    Comment: Self employed  Tobacco Use   Smoking status: Never   Smokeless tobacco: Never  Vaping Use   Vaping status: Never Used  Substance and Sexual Activity   Alcohol use: Yes    Alcohol/week: 0.0 standard drinks of alcohol    Comment: once a month   Drug use: No   Sexual activity: Yes    Birth control/protection: Post-menopausal  Other Topics Concern   Not on file  Social History Narrative   Not on file   Social Drivers of Health   Tobacco Use: Low Risk (03/28/2024)   Patient History    Smoking Tobacco Use: Never    Smokeless Tobacco Use: Never    Passive Exposure: Not on file  Financial Resource Strain: Low Risk (03/28/2024)   Overall Financial Resource Strain (CARDIA)    Difficulty of Paying Living Expenses: Not hard at all  Food Insecurity: No Food Insecurity (03/28/2024)   Epic    Worried About Radiation Protection Practitioner of Food in the Last Year: Never true    Ran Out of Food in the Last Year: Never true  Transportation Needs: No Transportation Needs (03/28/2024)   Epic    Lack of Transportation (Medical): No    Lack of Transportation (Non-Medical): No  Physical Activity: Unknown (03/28/2024)   Exercise Vital Sign    Days of Exercise per Week: 5 days    Minutes of Exercise per Session: Not on file  Recent Concern: Physical Activity - Inactive (03/26/2024)   Exercise Vital Sign    Days of Exercise per Week: 0 days    Minutes of Exercise per Session: Not on file  Stress: No Stress Concern Present (03/28/2024)   [employer] of Occupational Health - Occupational Stress Questionnaire    Feeling of Stress: Not at all  Social  Connections: Socially Integrated (03/28/2024)   Social Connection and Isolation Panel    Frequency of Communication with Friends and Family: Three times a week    Frequency of Social Gatherings with Friends and Family: Once a week    Attends Religious Services: More than 4 times per year    Active Member of Clubs or Organizations: Yes    Attends Banker Meetings: More than 4 times per year    Marital Status: Married  Catering Manager Violence: Not At Risk (03/28/2024)   Epic    Fear of Current or Ex-Partner: No    Emotionally Abused: No    Physically Abused: No    Sexually Abused: No  Depression (PHQ2-9): Low Risk (02/08/2024)   Depression (PHQ2-9)    PHQ-2 Score: 4  Recent Concern: Depression (  PHQ2-9) - Medium Risk (01/28/2024)   Depression (PHQ2-9)    PHQ-2 Score: 9  Alcohol Screen: Low Risk (03/28/2024)   Alcohol Screen    Last Alcohol Screening Score (AUDIT): 2  Housing: Low Risk (03/28/2024)   Epic    Unable to Pay for Housing in the Last Year: No    Number of Times Moved in the Last Year: 0    Homeless in the Last Year: No  Utilities: Not At Risk (03/28/2024)   Epic    Threatened with loss of utilities: No  Health Literacy: Adequate Health Literacy (03/28/2024)   B1300 Health Literacy    Frequency of need for help with medical instructions: Never   Family Status  Relation Name Status   Mother Rollene KANDICE Philips Deceased at age 37       Breast cancer and colon cancer   Father Devaughn C. Akers Deceased at age 60       died from MI   Brother  Deceased at age 9       fdied from Leukemia   Mat Uncle  (Not Specified)   Other Mat Great Grandmother Deceased  No partnership data on file   Family History  Problem Relation Age of Onset   Colon cancer Mother    Breast cancer Mother    Alcohol abuse Mother    Asthma Mother    Cancer Mother    COPD Mother    Drug abuse Mother    Coronary artery disease Father        first MI in his 68s   Schizophrenia Father     Early death Father    Heart disease Father    Intellectual disability Father    Leukemia Brother    Stomach cancer Maternal Uncle    Breast cancer Other    Allergies[1]  Patient Care Team: Gasper Nancyann BRAVO, MD as PCP - General (Family Medicine)   Medications: Outpatient Medications Prior to Visit  Medication Sig   albuterol  (VENTOLIN  HFA) 108 (90 Base) MCG/ACT inhaler INHALE 2 PUFFS BY MOUTH EVERY 6 HOURS AS NEEDED FOR WHEEZING   cholecalciferol (VITAMIN D3) 25 MCG (1000 UNIT) tablet Take 1,000 Units by mouth daily.   Lactobacillus (ACIDOPHILUS PO) Take 1 tablet by mouth daily at 12 noon.   ondansetron  (ZOFRAN -ODT) 4 MG disintegrating tablet Take 1 tablet (4 mg total) by mouth every 8 (eight) hours as needed for nausea or vomiting.   Probiotic Product (PROBIOTIC PO) Take 2 each by mouth daily. gummy   FT POVIDONE-IODINE EX Apply 1 Application topically daily at 12 noon.   HYDROcodone -acetaminophen  (NORCO/VICODIN) 5-325 MG tablet Take 1 tablet by mouth every 6 (six) hours as needed for moderate pain (pain score 4-6).   pantoprazole  (PROTONIX ) 40 MG tablet Take 1 tablet (40 mg total) by mouth 2 (two) times daily. (Patient not taking: Reported on 03/10/2024)   No facility-administered medications prior to visit.    Review of Systems  Constitutional:  Negative for appetite change, chills, fatigue and fever.  Respiratory:  Negative for chest tightness and shortness of breath.   Cardiovascular:  Negative for chest pain and palpitations.  Gastrointestinal:  Negative for abdominal pain, nausea and vomiting.  Neurological:  Negative for dizziness and weakness.      Objective    BP 114/62 (BP Location: Left Arm, Patient Position: Sitting, Cuff Size: Normal)   Pulse 72   Resp 16   Ht 5' 2 (1.575 m)   Wt 118 lb (53.5 kg)  LMP 02/15/2014   SpO2 99%   BMI 21.58 kg/m    Physical Exam  General Appearance:    Well developed, well nourished female. Alert, cooperative, in no  acute distress, appears stated age   Head:    Normocephalic, without obvious abnormality, atraumatic  Eyes:    PERRL, conjunctiva/corneas clear, EOM's intact, fundi    benign, both eyes  Ears:    Normal TM's and external ear canals, both ears  Nose:   Nares normal, septum midline, mucosa normal, no drainage    or sinus tenderness  Throat:   Lips, mucosa, and tongue normal; teeth and gums normal  Neck:   Supple, symmetrical, trachea midline, no adenopathy;    thyroid:  no enlargement/tenderness/nodules; no carotid   bruit or JVD  Back:     Symmetric, no curvature, ROM normal, no CVA tenderness  Lungs:     Clear to auscultation bilaterally, respirations unlabored  Chest Wall:    No tenderness or deformity   Heart:    Normal heart rate. Normal rhythm. No murmurs, rubs, or gallops.   Breast Exam:    deferred  Abdomen:     Soft, non-tender, bowel sounds active all four quadrants,    no masses, no organomegaly  Pelvic:    deferred  Extremities:   All extremities are intact. No cyanosis or edema  Pulses:   2+ and symmetric all extremities  Skin:   Skin color, texture, turgor normal, no rashes or lesions  Lymph nodes:   Cervical, supraclavicular, and axillary nodes normal  Neurologic:   CNII-XII intact, normal strength, sensation and reflexes    throughout       Last depression screening scores    02/08/2024    1:28 PM 01/28/2024   11:34 AM 11/12/2023    3:51 PM  PHQ 2/9 Scores  PHQ - 2 Score 0 0 0  PHQ- 9 Score 4  9  2       Data saved with a previous flowsheet row definition   Last fall risk screening    02/08/2024    1:28 PM  Fall Risk   Number falls in past yr: 0  Injury with Fall? 0   Risk for fall due to : No Fall Risks     Data saved with a previous flowsheet row definition   Last Audit-C alcohol use screening    03/28/2024    9:33 AM  Alcohol Use Disorder Test (AUDIT)  1. How often do you have a drink containing alcohol? 1  2. How many drinks containing alcohol  do you have on a typical day when you are drinking? 1  3. How often do you have six or more drinks on one occasion? 0  AUDIT-C Score 2   A score of 3 or more in women, and 4 or more in men indicates increased risk for alcohol abuse, EXCEPT if all of the points are from question 1   No results found for any visits on 03/28/24.  Assessment & Plan    Routine Health Maintenance and Physical Exam  Exercise Activities and Dietary recommendations  Goals   None     Immunization History  Administered Date(s) Administered    sv, Bivalent, Protein Subunit Rsvpref,pf [place]) 11/13/2023   Influenza, Seasonal, Injecte, Preservative Fre 05/14/2023   Influenza,inj,Quad PF,6+ Mos 03/12/2017, 01/05/2020, 02/03/2021   Influenza-Unspecified 01/06/2022   Moderna Covid-19 Fall Seasonal Vaccine 41yrs & older 01/06/2022, 05/24/2023   PFIZER Comirnaty(Gray Top)Covid-19 Tri-Sucrose Vaccine 07/22/2020   PFIZER(Purple  Top)SARS-COV-2 Vaccination 06/27/2019, 07/22/2019, 02/09/2020   PNEUMOCOCCAL CONJUGATE-20 03/28/2024   Pfizer Covid-19 Vaccine Bivalent Booster 63yrs & up 07/22/2020, 02/26/2021, 01/06/2022   Pneumococcal Polysaccharide-23 01/05/2020   Tdap 03/19/2007, 01/05/2020   Zoster Recombinant(Shingrix) 11/13/2023    Health Maintenance  Topic Date Due   COVID-19 Vaccine (9 - 2025-26 season) 12/17/2023   Zoster Vaccines- Shingrix (2 of 2) 01/08/2024   Influenza Vaccine  07/15/2024 (Originally 11/16/2023)   Mammogram  11/29/2024   Colonoscopy  03/02/2025   Cervical Cancer Screening (HPV/Pap Cotest)  05/13/2028   DTaP/Tdap/Td (3 - Td or Tdap) 01/04/2030   Pneumococcal Vaccine: 50+ Years  Completed   Hepatitis C Screening  Completed   HIV Screening  Completed   Hepatitis B Vaccines 19-59 Average Risk  Aged Out   HPV VACCINES  Aged Out   Meningococcal B Vaccine  Aged Out    Discussed health benefits of physical activity, and encouraged her to engage in regular exercise appropriate for her age  and condition.     Mixed hyperlipidemia with family history of premature heart disease. Previous statin therapy discontinued due to adverse effects. Cholesterol levels were normal after Lipitor, but liver enzymes were elevated. Considering rechecking cholesterol levels before initiating new therapy. Consider normal MRCP I am concerned that RUQ pain and elevated liver functions may have been related to atorvastatin .  - Ordered cholesterol panel. - Consider alternative statin therapy if cholesterol levels are elevated.  Helicobacter pylori infection Currently undergoing second round of antibiotics with no significant improvement in symptoms. Follow-up with gastroenterologist scheduled for next month. - Continue current antibiotic regimen. - Follow up with gastroenterologist next month.  Abnormal liver enzymes Previously elevated liver enzymes associated with gallbladder issues. Recent tests show significant improvement. - Continue to monitor liver enzyme levels if symptoms recur.  General Health Maintenance Up to date on most vaccinations. COVID vaccine due in February. Pneumonia vaccine options discussed. - Schedule second shingles vaccine at pharmacy in 2-4 weeks. - Plan for COVID vaccine in February. - Consider new pneumonia vaccine after completing antibiotics.             Nancyann Perry, MD  [facility] [phone] (phone) [phone] (fax)  [facility]    [1]  Allergies Allergen Reactions   Lodine [Etodolac] Other (See Comments)    Facial numbness   Metronidazole  Other (See Comments)    hallucinations

## 2024-03-31 ENCOUNTER — Ambulatory Visit: Payer: Self-pay | Admitting: Family Medicine

## 2024-03-31 DIAGNOSIS — E782 Mixed hyperlipidemia: Secondary | ICD-10-CM

## 2024-03-31 LAB — LIPID PANEL
Chol/HDL Ratio: 4.1 ratio (ref 0.0–4.4)
Cholesterol, Total: 221 mg/dL — ABNORMAL HIGH (ref 100–199)
HDL: 54 mg/dL (ref >39)
LDL Chol Calc (NIH): 155 mg/dL — ABNORMAL HIGH (ref 0–99)
Triglycerides: 71 mg/dL (ref 0–149)
VLDL Cholesterol Cal: 12 mg/dL (ref 5–40)

## 2024-03-31 LAB — APOLIPOPROTEIN B: Apolipoprotein B: 101 mg/dL — ABNORMAL HIGH (ref <90)

## 2024-03-31 LAB — LIPOPROTEIN A (LPA): Lipoprotein (a): 11 nmol/L (ref <75.0)

## 2024-03-31 MED ORDER — PRAVASTATIN SODIUM 20 MG PO TABS: 20.0000 mg | ORAL_TABLET | Freq: Every day | ORAL | 1 refills | Status: DC

## 2024-04-23 ENCOUNTER — Ambulatory Visit
Admission: RE | Admit: 2024-04-23 | Discharge: 2024-04-23 | Disposition: A | Discharge status: Home / Self Care | Source: Ambulatory Visit | Attending: Family Medicine | Admitting: Family Medicine

## 2024-04-23 ENCOUNTER — Other Ambulatory Visit: Payer: Self-pay | Admitting: Family Medicine

## 2024-04-23 ENCOUNTER — Encounter: Payer: Self-pay | Admitting: Family Medicine

## 2024-04-23 DIAGNOSIS — Z1231 Encounter for screening mammogram for malignant neoplasm of breast: Secondary | ICD-10-CM | POA: Diagnosis present

## 2024-04-23 DIAGNOSIS — R748 Abnormal levels of other serum enzymes: Secondary | ICD-10-CM

## 2024-04-29 ENCOUNTER — Ambulatory Visit: Payer: Self-pay | Admitting: Family Medicine

## 2024-04-29 LAB — HEPATIC FUNCTION PANEL
ALT: 28 IU/L (ref 0–32)
AST: 30 IU/L (ref 0–40)
Albumin: 4.6 g/dL (ref 3.9–4.9)
Alkaline Phosphatase: 143 IU/L — ABNORMAL HIGH (ref 49–135)
Bilirubin Total: 0.3 mg/dL (ref 0.0–1.2)
Bilirubin, Direct: 0.09 mg/dL (ref 0.00–0.40)
Total Protein: 6.9 g/dL (ref 6.0–8.5)

## 2025-04-03 ENCOUNTER — Encounter: Admitting: Family Medicine
